# Patient Record
Sex: Male | Born: 1935 | Race: White | Hispanic: No | Marital: Married | Smoking: Former smoker
Health system: Southern US, Community
[De-identification: ages and names within clinical notes are randomized; demographics above are authoritative.]

## PROBLEM LIST (undated history)

## (undated) DIAGNOSIS — N200 Calculus of kidney: Secondary | ICD-10-CM

## (undated) DIAGNOSIS — I442 Atrioventricular block, complete: Secondary | ICD-10-CM

## (undated) DIAGNOSIS — Z87442 Personal history of urinary calculi: Secondary | ICD-10-CM

## (undated) DIAGNOSIS — I10 Essential (primary) hypertension: Secondary | ICD-10-CM

## (undated) HISTORY — PX: PROSTATE BIOPSY: SHX241

## (undated) HISTORY — PX: CORONARY ARTERY BYPASS GRAFT: SHX141

## (undated) HISTORY — PX: KIDNEY STONE SURGERY: SHX686

## (undated) HISTORY — PX: EYE SURGERY: SHX253

---
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
## 1993-12-02 HISTORY — PX: CORONARY ARTERY BYPASS GRAFT: SHX141

## 1994-09-01 DIAGNOSIS — I442 Atrioventricular block, complete: Secondary | ICD-10-CM

## 1994-09-01 HISTORY — DX: Atrioventricular block, complete: I44.2

## 2011-07-05 ENCOUNTER — Telehealth (INDEPENDENT_AMBULATORY_CARE_PROVIDER_SITE_OTHER): Payer: Self-pay | Admitting: *Deleted

## 2011-07-05 DIAGNOSIS — Z1211 Encounter for screening for malignant neoplasm of colon: Secondary | ICD-10-CM

## 2011-07-05 NOTE — Telephone Encounter (Signed)
TCS sch'd 10/02/11 @ 10:30 (9:30), asa 2 days, movi prep inst given

## 2011-07-15 MED ORDER — PEG-KCL-NACL-NASULF-NA ASC-C 100 G PO SOLR: 1.0000 | Freq: Once | ORAL | Status: DC

## 2011-09-19 ENCOUNTER — Other Ambulatory Visit (INDEPENDENT_AMBULATORY_CARE_PROVIDER_SITE_OTHER): Payer: Self-pay | Admitting: *Deleted

## 2011-09-19 DIAGNOSIS — Z1211 Encounter for screening for malignant neoplasm of colon: Secondary | ICD-10-CM

## 2011-10-01 MED ORDER — SODIUM CHLORIDE 0.45 % IV SOLN
Freq: Once | INTRAVENOUS | Status: AC
  Administered 2011-10-02: 10:00:00 via INTRAVENOUS

## 2011-10-02 ENCOUNTER — Other Ambulatory Visit (INDEPENDENT_AMBULATORY_CARE_PROVIDER_SITE_OTHER): Payer: Self-pay | Admitting: Internal Medicine

## 2011-10-02 ENCOUNTER — Ambulatory Visit (HOSPITAL_COMMUNITY)
Admission: RE | Admit: 2011-10-02 | Discharge: 2011-10-02 | Disposition: A | Discharge status: Home / Self Care | Payer: Medicare Other | Source: Ambulatory Visit | Attending: Internal Medicine | Admitting: Internal Medicine

## 2011-10-02 ENCOUNTER — Encounter (HOSPITAL_COMMUNITY)
Admission: RE | Disposition: A | Discharge status: Home / Self Care | Payer: Self-pay | Source: Ambulatory Visit | Attending: Internal Medicine

## 2011-10-02 ENCOUNTER — Encounter (HOSPITAL_COMMUNITY): Payer: Self-pay

## 2011-10-02 DIAGNOSIS — Z1211 Encounter for screening for malignant neoplasm of colon: Secondary | ICD-10-CM | POA: Insufficient documentation

## 2011-10-02 DIAGNOSIS — K5730 Diverticulosis of large intestine without perforation or abscess without bleeding: Secondary | ICD-10-CM

## 2011-10-02 DIAGNOSIS — D126 Benign neoplasm of colon, unspecified: Secondary | ICD-10-CM | POA: Insufficient documentation

## 2011-10-02 DIAGNOSIS — K648 Other hemorrhoids: Secondary | ICD-10-CM | POA: Insufficient documentation

## 2011-10-02 DIAGNOSIS — K644 Residual hemorrhoidal skin tags: Secondary | ICD-10-CM

## 2011-10-02 HISTORY — PX: COLONOSCOPY: SHX5424

## 2011-10-02 HISTORY — DX: Essential (primary) hypertension: I10

## 2011-10-02 HISTORY — DX: Atrioventricular block, complete: I44.2

## 2011-10-02 SURGERY — COLONOSCOPY
Anesthesia: Moderate Sedation

## 2011-10-02 MED ORDER — MEPERIDINE HCL 50 MG/ML IJ SOLN
INTRAMUSCULAR | Status: DC | PRN
  Administered 2011-10-02 (×2): 25 mg via INTRAVENOUS

## 2011-10-02 MED ORDER — STERILE WATER FOR IRRIGATION IR SOLN
Status: DC | PRN
  Administered 2011-10-02: 11:00:00

## 2011-10-02 MED ORDER — MIDAZOLAM HCL 5 MG/5ML IJ SOLN
INTRAMUSCULAR | Status: DC | PRN
  Administered 2011-10-02: 2 mg via INTRAVENOUS
  Administered 2011-10-02: 1 mg via INTRAVENOUS
  Administered 2011-10-02: 2 mg via INTRAVENOUS

## 2011-10-02 MED ORDER — MIDAZOLAM HCL 5 MG/5ML IJ SOLN
INTRAMUSCULAR | Status: AC
  Filled 2011-10-02: qty 10

## 2011-10-02 MED ORDER — MEPERIDINE HCL 50 MG/ML IJ SOLN
INTRAMUSCULAR | Status: AC
  Filled 2011-10-02: qty 1

## 2011-10-02 NOTE — H&P (Signed)
Mitchell Chandler is an 77 y.o. male.   Chief Complaint:   Patient is here for colonoscopy. HPI:  Patient is 77 year old Caucasian male who is here is first average risk screening colonoscopy. He denies abdominal pain change in his bowel habits or rectal bleeding..  Family history is negative for colorectal carcinoma.  Past Medical History  Diagnosis Date  . Hypertension   . 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 And[facility]    Past Surgical History  Procedure Date  . Eye surgery     feb 7 feb 28 [facility]  . Kidney stone surgery   . Coronary artery bypass graft     No family history on file. Social History:  reports that he has never smoked. He does not have any smokeless tobacco history on file. He reports that he does not drink alcohol or use illicit drugs.  Allergies: No Known Allergies  Medications Prior to Admission  Medication Dose Route Frequency Provider Last Rate Last Dose  . 0.45 % sodium chloride infusion   Intravenous Once Malissa Hippo, MD 20 mL/hr at 10/02/11 0953    . meperidine (DEMEROL) 50 MG/ML injection           . midazolam (VERSED) 5 MG/5ML injection            Medications Prior to Admission  Medication Sig Dispense Refill  . amLODipine-benazepril (LOTREL) 10-20 MG per capsule Take 1 capsule by mouth daily.        Marland Kitchen aspirin EC 81 MG tablet Take 81 mg by mouth daily.        . Cholecalciferol (VITAMIN D) 2000 UNITS CAPS Take 1 capsule by mouth daily.        Marland Kitchen doxazosin (CARDURA) 8 MG tablet Take 8 mg by mouth at bedtime.        . Folic Acid-Vit B6-Vit B12 (FOLBEE) 2.5-25-1 MG TABS Take 1 tablet by mouth daily.        . furosemide (LASIX) 20 MG tablet Take 20 mg by mouth daily.        . metoprolol (LOPRESSOR) 100 MG tablet Take 100 mg by mouth daily.        . peg 3350 powder (MOVIPREP) 100 G SOLR Take 1 kit (100 g total) by mouth once.  1 kit  0  . simvastatin (ZOCOR) 20 MG tablet Take 20 mg by mouth at bedtime.          No results found for  this or any previous visit (from the past 48 hour(s)). No results found.  Review of Systems  Constitutional: Negative for weight loss.  Gastrointestinal: Negative for abdominal pain, diarrhea, constipation, blood in stool and melena.    Blood pressure 140/67, pulse 90, temperature 98.7 F (37.1 C), temperature source Oral, resp. rate 33, height 5\' 9"  (1.753 m), weight 207 lb (93.895 kg), SpO2 97.00%. Physical Exam  Constitutional: He appears well-developed and well-nourished.  HENT:  Mouth/Throat: Oropharynx is clear and moist.  Eyes: Conjunctivae are normal. No scleral icterus.  Neck: No thyromegaly present.  Cardiovascular: Normal rate, regular rhythm and normal heart sounds.   No murmur heard. Respiratory: Effort normal and breath sounds normal.  GI: Soft. He exhibits no mass. There is no tenderness.  Musculoskeletal: He exhibits no edema.  Lymphadenopathy:    He has no cervical adenopathy.  Neurological: He is alert.  Skin: Skin is warm and dry.     Assessment/Plan  average risk screening colonoscopy.Marland Kitchen  REHMAN,NAJEEB U 10/02/2011, 10:41 AM

## 2011-10-02 NOTE — Op Note (Signed)
COLONOSCOPY PROCEDURE REPORT  PATIENT:  Mitchell Chandler  MR#:  [id number] Birthdate:  02/23/1932, 77 y.o., male Endoscopist:  Dr. Malissa Hippo, MD Referred By:  Dr.  Colon Branch,  M.D. Procedure Date: 10/02/2011  Procedure:   Colonoscopy with polypectomy  Indications:  Patient is 77 year old Caucasian male who is here for first average risk screening colonoscopy.  Informed Consent:  Procedure and risks were reviewed with the patient and informed consent was obtained.  Medications:  Demerol  50 mg IV Versed  5 mg IV  Description of procedure:  After a digital rectal exam was performed, that colonoscope was advanced from the anus through the rectum and colon to the area of the cecum, ileocecal valve and appendiceal orifice. The cecum was deeply intubated. These structures were well-seen and photographed for the record. From the level of the cecum and ileocecal valve, the scope was slowly and cautiously withdrawn. The mucosal surfaces were carefully surveyed utilizing scope tip to flexion to facilitate fold flattening as needed. The scope was pulled down into the rectum where a thorough exam including retroflexion was performed.  Findings:    Preparation was satisfactory.   2 small cecal polyps ablated via cold biopsy and submitted in one container.  15 mm sessile polyp at ascending colon with small finger like extension. This polyp was elevated with saline injection and polypectomy performed.  This polyp was removed in 2 pieces.  3 hemoclips applied at polypectomy site to reduce risk of bleeding.  4 mm polyp at proximal sigmoid colon was coagulated using snare tip.  8 mm polyp snared from distal sigmoid colon.   Scattered diverticula at sigmoid colon.  Internal/external hemorrhoids.  Therapeutic/Diagnostic Maneuvers Performed:   See above  Complications:   none  Cecal Withdrawal Time:   33 minutes  Impression:   Examination performed to cecum.  2 small polyps ablated via cold  biopsy from cecum  and submitted in one container.  15 mm sessile polyp snared from ascending colon following saline injection.  Three  hemoclips applied to polypectomy site. Polypectomy complete.  Small polyp at sigmoid colon was coagulated.  8 mm polyp snared from distal sigmoid colon.  Mild sigmoid diverticulosis.  Internal/external hemorrhoids. Recommendations:   No aspirin one week.  I will be contacting patient with results of biopsy and further recommendations.  Tiari Andringa U  10/02/2011 11:27 AM  CC: Dr. Colon Branch, MD & Dr. Bonnetta Barry ref. provider found

## 2011-10-08 ENCOUNTER — Encounter (HOSPITAL_COMMUNITY): Payer: Self-pay | Admitting: Internal Medicine

## 2011-10-11 ENCOUNTER — Encounter (INDEPENDENT_AMBULATORY_CARE_PROVIDER_SITE_OTHER): Payer: Self-pay | Admitting: *Deleted

## 2014-10-13 ENCOUNTER — Encounter (INDEPENDENT_AMBULATORY_CARE_PROVIDER_SITE_OTHER): Payer: Self-pay | Admitting: *Deleted

## 2014-10-19 ENCOUNTER — Telehealth (INDEPENDENT_AMBULATORY_CARE_PROVIDER_SITE_OTHER): Payer: Self-pay | Admitting: *Deleted

## 2014-10-19 ENCOUNTER — Other Ambulatory Visit (INDEPENDENT_AMBULATORY_CARE_PROVIDER_SITE_OTHER): Payer: Self-pay | Admitting: *Deleted

## 2014-10-19 DIAGNOSIS — Z86010 Personal history of colonic polyps: Secondary | ICD-10-CM

## 2014-10-19 DIAGNOSIS — Z1211 Encounter for screening for malignant neoplasm of colon: Secondary | ICD-10-CM

## 2014-10-19 NOTE — Telephone Encounter (Signed)
Patient needs movi prep 

## 2014-10-20 MED ORDER — PEG-KCL-NACL-NASULF-NA ASC-C 100 G PO SOLR: 1.0000 | Freq: Once | ORAL | Status: DC

## 2014-11-21 ENCOUNTER — Telehealth (INDEPENDENT_AMBULATORY_CARE_PROVIDER_SITE_OTHER): Payer: Self-pay | Admitting: *Deleted

## 2014-11-21 NOTE — Telephone Encounter (Signed)
Referring MD/PCP: Eula Fried   Procedure: tcs  Reason/Indication:  Hx polyps  Has patient had this procedure before?  Yes, 2012 -- epic  If so, when, by whom and where?    Is there a family history of colon cancer?  no  Who?  What age when diagnosed?    Is patient diabetic?   no      Does patient have prosthetic heart valve?  no  Do you have a pacemaker?  no  Has patient ever had endocarditis? no  Has patient had joint replacement within last 12 months?  no  Does patient tend to be constipated or take laxatives? no  Is patient on Coumadin, Plavix and/or Aspirin? yes  Medications: asa 81 mg daily, metoprolol 50 mg daily, furosemide 20 mg daily, amlodipine 10/20 mg 1 tab every other day, doxazosin 4 mg daily, simvastatin 20 mg daily  Allergies: nkda  Medication Adjustment: asa 2 days  Procedure date & time: 12/21/14 at 1030

## 2014-11-28 NOTE — Telephone Encounter (Signed)
agree

## 2014-12-21 ENCOUNTER — Encounter (HOSPITAL_COMMUNITY)
Admission: RE | Disposition: A | Discharge status: Home / Self Care | Payer: Self-pay | Source: Ambulatory Visit | Attending: Internal Medicine

## 2014-12-21 ENCOUNTER — Encounter (HOSPITAL_COMMUNITY): Payer: Self-pay

## 2014-12-21 ENCOUNTER — Ambulatory Visit (HOSPITAL_COMMUNITY)
Admission: RE | Admit: 2014-12-21 | Discharge: 2014-12-21 | Disposition: A | Discharge status: Home / Self Care | Payer: Medicare Other | Source: Ambulatory Visit | Attending: Internal Medicine | Admitting: Internal Medicine

## 2014-12-21 DIAGNOSIS — Z1211 Encounter for screening for malignant neoplasm of colon: Secondary | ICD-10-CM | POA: Insufficient documentation

## 2014-12-21 DIAGNOSIS — I10 Essential (primary) hypertension: Secondary | ICD-10-CM | POA: Insufficient documentation

## 2014-12-21 DIAGNOSIS — K644 Residual hemorrhoidal skin tags: Secondary | ICD-10-CM | POA: Diagnosis not present

## 2014-12-21 DIAGNOSIS — D124 Benign neoplasm of descending colon: Secondary | ICD-10-CM

## 2014-12-21 DIAGNOSIS — K635 Polyp of colon: Secondary | ICD-10-CM | POA: Insufficient documentation

## 2014-12-21 DIAGNOSIS — Z951 Presence of aortocoronary bypass graft: Secondary | ICD-10-CM | POA: Diagnosis not present

## 2014-12-21 DIAGNOSIS — Z7982 Long term (current) use of aspirin: Secondary | ICD-10-CM | POA: Diagnosis not present

## 2014-12-21 DIAGNOSIS — K5730 Diverticulosis of large intestine without perforation or abscess without bleeding: Secondary | ICD-10-CM

## 2014-12-21 DIAGNOSIS — Z86010 Personal history of colonic polyps: Secondary | ICD-10-CM

## 2014-12-21 DIAGNOSIS — D122 Benign neoplasm of ascending colon: Secondary | ICD-10-CM

## 2014-12-21 DIAGNOSIS — Z87891 Personal history of nicotine dependence: Secondary | ICD-10-CM | POA: Diagnosis not present

## 2014-12-21 DIAGNOSIS — K648 Other hemorrhoids: Secondary | ICD-10-CM | POA: Diagnosis not present

## 2014-12-21 DIAGNOSIS — K649 Unspecified hemorrhoids: Secondary | ICD-10-CM

## 2014-12-21 HISTORY — PX: COLONOSCOPY: SHX5424

## 2014-12-21 SURGERY — COLONOSCOPY
Anesthesia: Moderate Sedation

## 2014-12-21 MED ORDER — MIDAZOLAM HCL 5 MG/5ML IJ SOLN
INTRAMUSCULAR | Status: AC
  Filled 2014-12-21: qty 10

## 2014-12-21 MED ORDER — MEPERIDINE HCL 50 MG/ML IJ SOLN
INTRAMUSCULAR | Status: AC
  Filled 2014-12-21: qty 1

## 2014-12-21 MED ORDER — STERILE WATER FOR IRRIGATION IR SOLN
Status: DC | PRN
  Administered 2014-12-21: 10:00:00

## 2014-12-21 MED ORDER — MIDAZOLAM HCL 5 MG/5ML IJ SOLN
INTRAMUSCULAR | Status: DC | PRN
  Administered 2014-12-21: 1 mg via INTRAVENOUS
  Administered 2014-12-21 (×2): 2 mg via INTRAVENOUS

## 2014-12-21 MED ORDER — MEPERIDINE HCL 50 MG/ML IJ SOLN
INTRAMUSCULAR | Status: DC | PRN
  Administered 2014-12-21: 25 mg

## 2014-12-21 MED ORDER — SODIUM CHLORIDE 0.9 % IV SOLN
INTRAVENOUS | Status: DC
  Administered 2014-12-21: 10:00:00 via INTRAVENOUS

## 2014-12-21 NOTE — Discharge Instructions (Signed)
Resume usual medications and high fiber diet. No driving for 24 hours. Physician will call with biopsy results.  Colonoscopy, Care After Refer to this sheet in the next few weeks. These instructions provide you with information on caring for yourself after your procedure. Your health care provider may also give you more specific instructions. Your treatment has been planned according to current medical practices, but problems sometimes occur. Call your health care provider if you have any problems or questions after your procedure. WHAT TO EXPECT AFTER THE PROCEDURE  After your procedure, it is typical to have the following:  A small amount of blood in your stool.  Moderate amounts of gas and mild abdominal cramping or bloating. HOME CARE INSTRUCTIONS  Do not drive, operate machinery, or sign important documents for 24 hours.  You may shower and resume your regular physical activities, but move at a slower pace for the first 24 hours.  Take frequent rest periods for the first 24 hours.  Walk around or put a warm pack on your abdomen to help reduce abdominal cramping and bloating.  Drink enough fluids to keep your urine clear or pale yellow.  You may resume your normal diet as instructed by your health care provider. Avoid heavy or fried foods that are hard to digest.  Avoid drinking alcohol for 24 hours or as instructed by your health care provider.  Only take over-the-counter or prescription medicines as directed by your health care provider.  If a tissue sample (biopsy) was taken during your procedure:  Do not take aspirin or blood thinners for 7 days, or as instructed by your health care provider.  Do not drink alcohol for 7 days, or as instructed by your health care provider.  Eat soft foods for the first 24 hours. SEEK MEDICAL CARE IF: You have persistent spotting of blood in your stool 2-3 days after the procedure. SEEK IMMEDIATE MEDICAL CARE IF:  You have more than a  small spotting of blood in your stool.  You pass large blood clots in your stool.  Your abdomen is swollen (distended).  You have nausea or vomiting.  You have a fever.  You have increasing abdominal pain that is not relieved with medicine. Document Released: 07/02/2004 Document Revised: 09/08/2013 Document Reviewed: 07/26/2013 [facility] Patient Information 2015 [place], [place]. This information is not intended to replace advice given to you by your health care provider. Make sure you discuss any questions you have with your health care provider.   High-Fiber Diet Fiber is found in fruits, vegetables, and grains. A high-fiber diet encourages the addition of more whole grains, legumes, fruits, and vegetables in your diet. The recommended amount of fiber for adult males is 38 g per day. For adult females, it is 25 g per day. Pregnant and lactating women should get 28 g of fiber per day. If you have a digestive or bowel problem, ask your caregiver for advice before adding high-fiber foods to your diet. Eat a variety of high-fiber foods instead of only a select few type of foods.  PURPOSE  To increase stool bulk.  To make bowel movements more regular to prevent constipation.  To lower cholesterol.  To prevent overeating. WHEN IS THIS DIET USED?  It may be used if you have constipation and hemorrhoids.  It may be used if you have uncomplicated diverticulosis (intestine condition) and irritable bowel syndrome.  It may be used if you need help with weight management.  It may be used if you want  to add it to your diet as a protective measure against atherosclerosis, diabetes, and cancer. SOURCES OF FIBER  Whole-grain breads and cereals.  Fruits, such as apples, oranges, bananas, berries, prunes, and pears.  Vegetables, such as green peas, carrots, sweet potatoes, beets, broccoli, cabbage, spinach, and artichokes.  Legumes, such split peas, soy, lentils.  Almonds. FIBER CONTENT IN  FOODS Starches and Grains / Dietary Fiber (g)  Cheerios, 1 cup / 3 g  Corn Flakes cereal, 1 cup / 0.7 g  Rice crispy treat cereal, 1 cup / 0.3 g  Instant oatmeal (cooked),  cup / 2 g  Frosted wheat cereal, 1 cup / 5.1 g  Brown, long-grain rice (cooked), 1 cup / 3.5 g  White, long-grain rice (cooked), 1 cup / 0.6 g  Enriched macaroni (cooked), 1 cup / 2.5 g Legumes / Dietary Fiber (g)  Baked beans (canned, plain, or vegetarian),  cup / 5.2 g  Kidney beans (canned),  cup / 6.8 g  Pinto beans (cooked),  cup / 5.5 g Breads and Crackers / Dietary Fiber (g)  Plain or honey graham crackers, 2 squares / 0.7 g  Saltine crackers, 3 squares / 0.3 g  Plain, salted pretzels, 10 pieces / 1.8 g  Whole-wheat bread, 1 slice / 1.9 g  White bread, 1 slice / 0.7 g  Raisin bread, 1 slice / 1.2 g  Plain bagel, 3 oz / 2 g  Flour tortilla, 1 oz / 0.9 g  Corn tortilla, 1 small / 1.5 g  Hamburger or hotdog bun, 1 small / 0.9 g Fruits / Dietary Fiber (g)  Apple with skin, 1 medium / 4.4 g  Sweetened applesauce,  cup / 1.5 g  Banana,  medium / 1.5 g  Grapes, 10 grapes / 0.4 g  Orange, 1 small / 2.3 g  Raisin, 1.5 oz / 1.6 g  Melon, 1 cup / 1.4 g Vegetables / Dietary Fiber (g)  Green beans (canned),  cup / 1.3 g  Carrots (cooked),  cup / 2.3 g  Broccoli (cooked),  cup / 2.8 g  Peas (cooked),  cup / 4.4 g  Mashed potatoes,  cup / 1.6 g  Lettuce, 1 cup / 0.5 g  Corn (canned),  cup / 1.6 g  Tomato,  cup / 1.1 g Document Released: 11/18/2005 Document Revised: 05/19/2012 Document Reviewed: 02/20/2012 [employer] Patient Information 2015 [place], [place]. This information is not intended to replace advice given to you by your health care provider. Make sure you discuss any questions you have with your health care provider.   Colon Polyps Polyps are lumps of extra tissue growing inside the body. Polyps can grow in the large intestine (colon). Most colon polyps  are noncancerous (benign). However, some colon polyps can become cancerous over time. Polyps that are larger than a pea may be harmful. To be safe, caregivers remove and test all polyps. CAUSES  Polyps form when mutations in the genes cause your cells to grow and divide even though no more tissue is needed. RISK FACTORS There are a number of risk factors that can increase your chances of getting colon polyps. They include:  Being older than 50 years.  Family history of colon polyps or colon cancer.  Long-term colon diseases, such as colitis or Crohn disease.  Being overweight.  Smoking.  Being inactive.  Drinking too much alcohol. SYMPTOMS  Most small polyps do not cause symptoms. If symptoms are present, they may include:  Blood in the stool. The  stool may look dark red or black.  Constipation or diarrhea that lasts longer than 1 week. DIAGNOSIS People often do not know they have polyps until their caregiver finds them during a regular checkup. Your caregiver can use 4 tests to check for polyps:  Digital rectal exam. The caregiver wears gloves and feels inside the rectum. This test would find polyps only in the rectum.  Barium enema. The caregiver puts a liquid called barium into your rectum before taking X-rays of your colon. Barium makes your colon look white. Polyps are dark, so they are easy to see in the X-Beni pictures.  Sigmoidoscopy. A thin, flexible tube (sigmoidoscope) is placed into your rectum. The sigmoidoscope has a light and tiny camera in it. The caregiver uses the sigmoidoscope to look at the last third of your colon.  Colonoscopy. This test is like sigmoidoscopy, but the caregiver looks at the entire colon. This is the most common method for finding and removing polyps. TREATMENT  Any polyps will be removed during a sigmoidoscopy or colonoscopy. The polyps are then tested for cancer. PREVENTION  To help lower your risk of getting more colon polyps:  Eat  plenty of fruits and vegetables. Avoid eating fatty foods.  Do not smoke.  Avoid drinking alcohol.  Exercise every day.  Lose weight if recommended by your caregiver.  Eat plenty of calcium and folate. Foods that are rich in calcium include milk, cheese, and broccoli. Foods that are rich in folate include chickpeas, kidney beans, and spinach. HOME CARE INSTRUCTIONS Keep all follow-up appointments as directed by your caregiver. You may need periodic exams to check for polyps. SEEK MEDICAL CARE IF: You notice bleeding during a bowel movement. Document Released: 08/14/2004 Document Revised: 02/10/2012 Document Reviewed: 01/28/2012 [facility] Patient Information 2015 [place], [place]. This information is not intended to replace advice given to you by your health care provider. Make sure you discuss any questions you have with your health care provider.

## 2014-12-21 NOTE — H&P (Signed)
Mitchell Chandler is an 81 y.o. male.   Chief Complaint: Patient is here for colonoscopy. HPI: Patient is 81 year old Caucasian male who was history of colonic adenomas and is here for surveillance colonoscopy. His last colonoscopy was in October 2012 with removal of 3 polyps including 15 mm flat polyp from ascending colon. He denies abdominal pain change in bowel habits or rectal bleeding. Family history is negative for CRC.  Past Medical History  Diagnosis Date  . Hypertension   . 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 Redmond [facility]    Past Surgical History  Procedure Laterality Date  . Eye surgery      feb 7 feb 28 [place]  . Kidney stone surgery    . Coronary artery bypass graft    . Colonoscopy  10/02/2011    Procedure: COLONOSCOPY;  Surgeon: Rogene Houston, MD;  Location: [facility];  Service: Endoscopy;  Laterality: N/A;  10:30 am    History reviewed. No pertinent family history. Social History:  reports that he quit smoking about 52 years ago. He does not have any smokeless tobacco history on file. He reports that he does not drink alcohol or use illicit drugs.  Allergies: No Known Allergies  Medications Prior to Admission  Medication Sig Dispense Refill  . amLODipine-benazepril (LOTREL) 10-20 MG per capsule Take 1 capsule by mouth daily.      Marland Kitchen aspirin 81 MG chewable tablet Chew 81 mg by mouth daily.    Marland Kitchen doxazosin (CARDURA) 8 MG tablet Take 8 mg by mouth at bedtime.      . Folic Acid-Vit M3-OBO F96 (FOLBEE) 2.5-25-1 MG TABS Take 1 tablet by mouth daily.      . furosemide (LASIX) 20 MG tablet Take 20 mg by mouth daily.      . metoprolol (LOPRESSOR) 50 MG tablet Take 50 mg by mouth daily.    . peg 3350 powder (MOVIPREP) 100 G SOLR Take 1 kit (200 g total) by mouth once. 1 kit 0  . simvastatin (ZOCOR) 20 MG tablet Take 20 mg by mouth at bedtime.      . peg 3350 powder (MOVIPREP) 100 G SOLR Take 1 kit (100 g total) by mouth once. (Patient not taking: Reported  on 12/15/2014) 1 kit 0    No results found for this or any previous visit (from the past 48 hour(s)). No results found.  ROS  Blood pressure 155/71, pulse 66, temperature 98.1 F (36.7 C), temperature source Oral, resp. rate 18, height '5\' 8"'  (1.727 m), weight 205 lb (92.987 kg), SpO2 94 %. Physical Exam  Constitutional: He appears well-developed and well-nourished.  HENT:  Mouth/Throat: Oropharynx is clear and moist.  Eyes: Conjunctivae are normal. No scleral icterus.  Neck: No thyromegaly present.  Cardiovascular: Normal rate, regular rhythm and normal heart sounds.   No murmur heard. Respiratory: Effort normal and breath sounds normal.  GI: Soft. He exhibits no distension and no mass. There is no tenderness.  Musculoskeletal: He exhibits no edema.  Lymphadenopathy:    He has no cervical adenopathy.  Neurological: He is alert.  Skin: Skin is warm and dry.     Assessment/Plan History of colonic adenomas. Surveillance colonoscopy.  Mitchell,NAJEEB Chandler 12/21/2014, 10:21 AM

## 2014-12-21 NOTE — Op Note (Signed)
COLONOSCOPY PROCEDURE REPORT  PATIENT:  Mitchell Chandler  MR#:  [id number] Birthdate:  1932-02-08, 81 y.o., male Endoscopist:  Dr. Rogene Houston, MD Referred By:  Dr. Cleda Mccreedy, MD Procedure Date: 12/21/2014  Procedure:   Colonoscopy  Indications:  Patient is 81 year old Caucasian male with history of colonic polyps who is here for surveillance colonoscopy. His last exam was in October 2012 with removal of 3 adenomas including large adenoma from ascending colon.  Informed Consent:  The procedure and risks were reviewed with the patient and informed consent was obtained.  Medications:  Demerol 25 mg IV Versed 5 mg IV  Description of procedure:  After a digital rectal exam was performed, that colonoscope was advanced from the anus through the rectum and colon to the area of the cecum, ileocecal valve and appendiceal orifice. The cecum was deeply intubated. These structures were well-seen and photographed for the record. From the level of the cecum and ileocecal valve, the scope was slowly and cautiously withdrawn. The mucosal surfaces were carefully surveyed utilizing scope tip to flexion to facilitate fold flattening as needed. The scope was pulled down into the rectum where a thorough exam including retroflexion was performed.  Findings:   Prep satisfactory. Scar noted at ascending colon just above and across ileocecal valve site of previous polypectomy. No residual polyp identified. Small polyp ablated cold biopsy from ascending colon. Two small polyps cold snared from ascending colon and submitted along with polyp from ascending colon. Few scattered diverticula at descending with moderate number of diverticula at sigmoid colon. Normal rectal mucosa. Small hemorrhoids above and below the dentate line.   Therapeutic/Diagnostic Maneuvers Performed:  See above  Complications:  None  Cecal Withdrawal Time:  19 minutes  Impression:  Examination performed to cecum. Scar noted at  ascending colon, site of previous polypectomy and no residual polyp identified. Three small polyps removed as above. One was lost and the other two were submitted together(one at ascending colon and two at descending colon). Left-sided diverticulosis. Small internal and external hemorrhoids.  Recommendations:  Standard instructions given. High fiber diet. I will contact patient with biopsy results and further recommendations.  Cristela Stalder U  12/21/2014 11:00 AM  CC: Dr. Cleda Mccreedy, MD & Dr. Rayne Du ref. provider found

## 2014-12-22 ENCOUNTER — Encounter (HOSPITAL_COMMUNITY): Payer: Self-pay | Admitting: Internal Medicine

## 2014-12-27 ENCOUNTER — Encounter (INDEPENDENT_AMBULATORY_CARE_PROVIDER_SITE_OTHER): Payer: Self-pay | Admitting: *Deleted

## 2016-12-25 DIAGNOSIS — I10 Essential (primary) hypertension: Secondary | ICD-10-CM | POA: Insufficient documentation

## 2016-12-25 DIAGNOSIS — N400 Enlarged prostate without lower urinary tract symptoms: Secondary | ICD-10-CM

## 2016-12-25 DIAGNOSIS — E785 Hyperlipidemia, unspecified: Secondary | ICD-10-CM | POA: Insufficient documentation

## 2016-12-25 HISTORY — DX: Benign prostatic hyperplasia without lower urinary tract symptoms: N40.0

## 2019-05-17 ENCOUNTER — Other Ambulatory Visit: Payer: Self-pay | Admitting: Physician Assistant

## 2019-05-17 ENCOUNTER — Other Ambulatory Visit (HOSPITAL_COMMUNITY): Payer: Self-pay | Admitting: Physician Assistant

## 2019-05-17 DIAGNOSIS — N200 Calculus of kidney: Secondary | ICD-10-CM

## 2019-05-20 ENCOUNTER — Ambulatory Visit (HOSPITAL_COMMUNITY)
Admission: RE | Admit: 2019-05-20 | Discharge: 2019-05-20 | Disposition: A | Discharge status: Home / Self Care | Payer: Medicare Other | Source: Ambulatory Visit | Attending: Physician Assistant | Admitting: Physician Assistant

## 2019-05-20 ENCOUNTER — Other Ambulatory Visit: Payer: Self-pay

## 2019-05-20 DIAGNOSIS — N200 Calculus of kidney: Secondary | ICD-10-CM | POA: Diagnosis not present

## 2019-05-20 IMAGING — CT CT ABDOMEN AND PELVIS WITHOUT CONTRAST
2 of 4 series · 16 of 46 positions shown, 18 images · non-contrast
Comparison: None.

CLINICAL DATA: Left flank pain for 3 weeks. Nephrolithiasis.

EXAM:
CT ABDOMEN AND PELVIS WITHOUT CONTRAST
TECHNIQUE: Multidetector CT imaging of the abdomen and pelvis was performed
following the standard protocol without IV contrast.

[Series 2: axial st · axial · 0.82mm/px · z∈[+1059,+1464]mm · 13 of 91 slices shown, 15 images]
[im 5/91  soft-tissue]
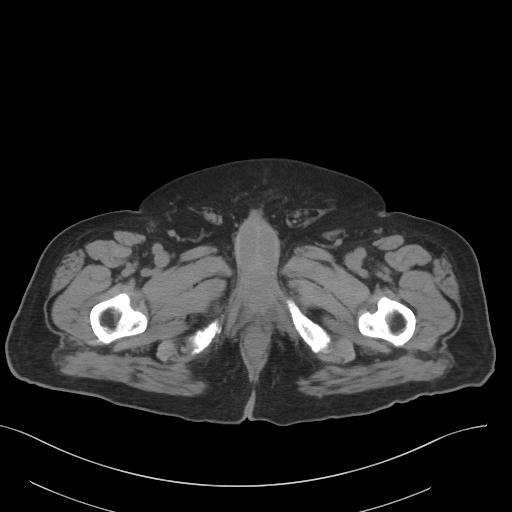
[im 5/91  bone]
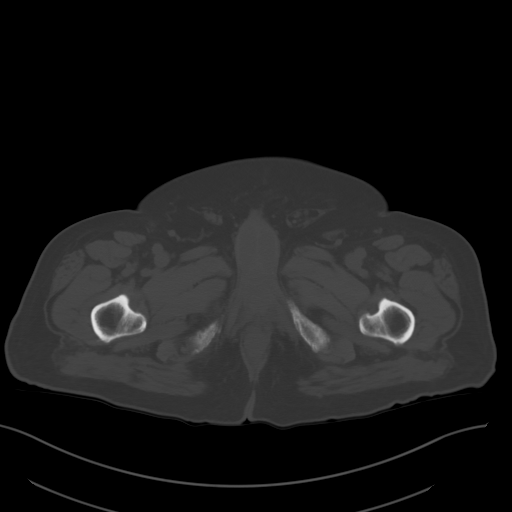
[im 15/91  soft-tissue]
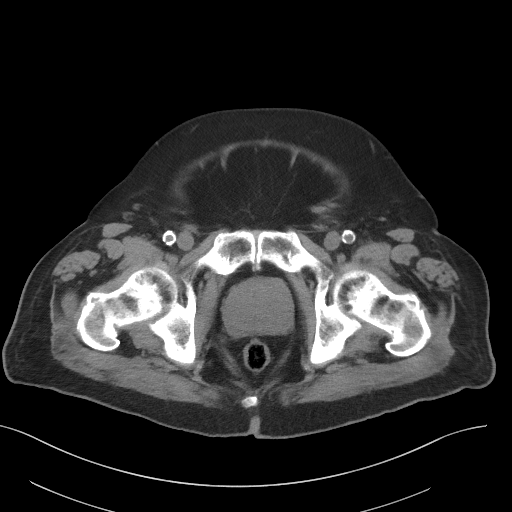
[im 19/91  soft-tissue]
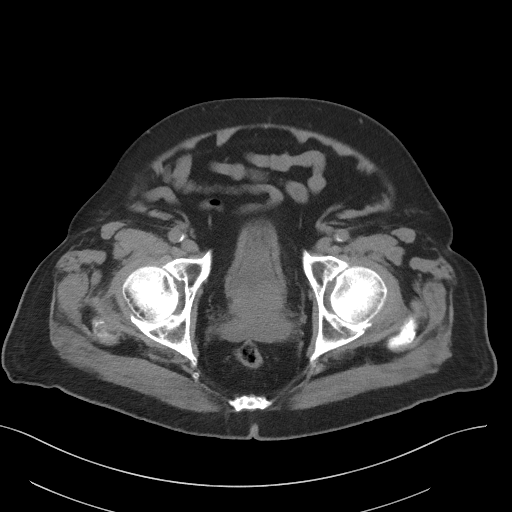
[im 24/91  soft-tissue]
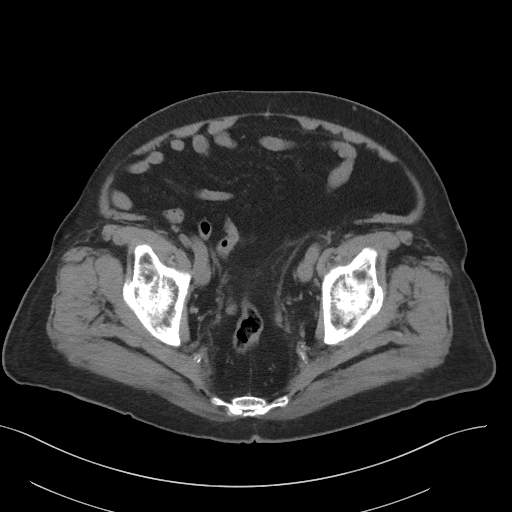
[im 34/91  soft-tissue]
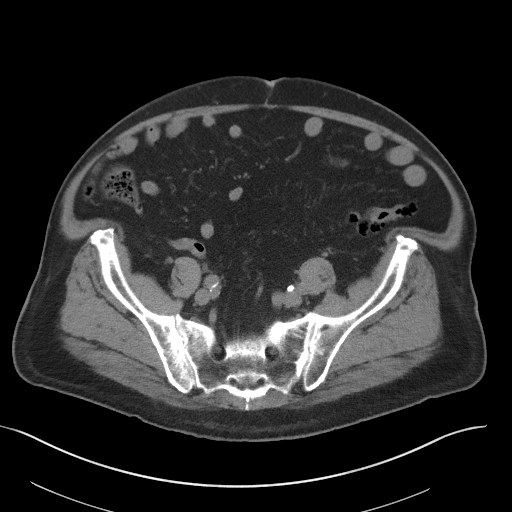
[im 38/91  soft-tissue]
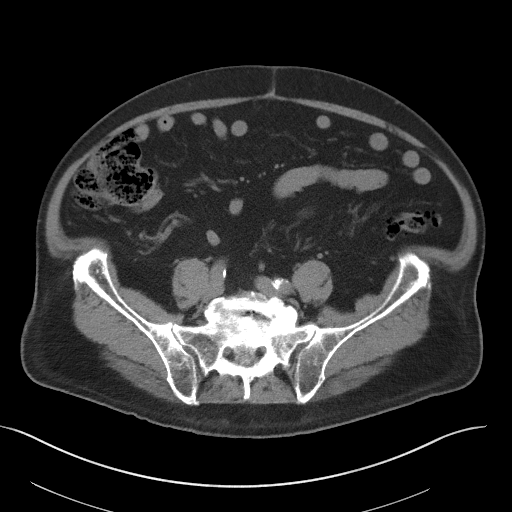
[im 48/91  soft-tissue]
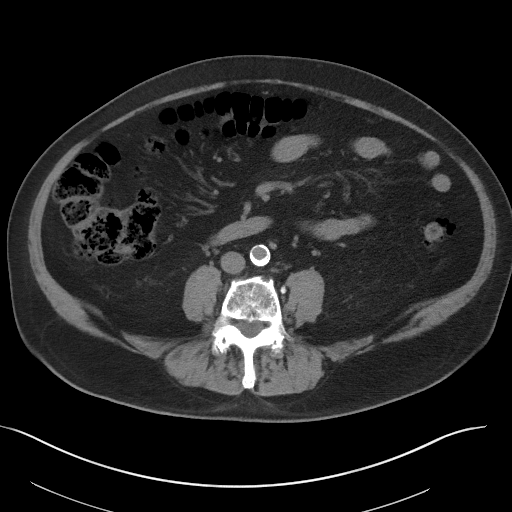
[im 53/91  soft-tissue]
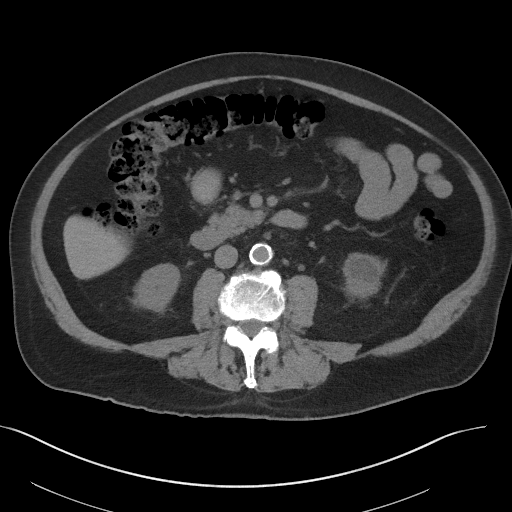
[im 57/91  soft-tissue]
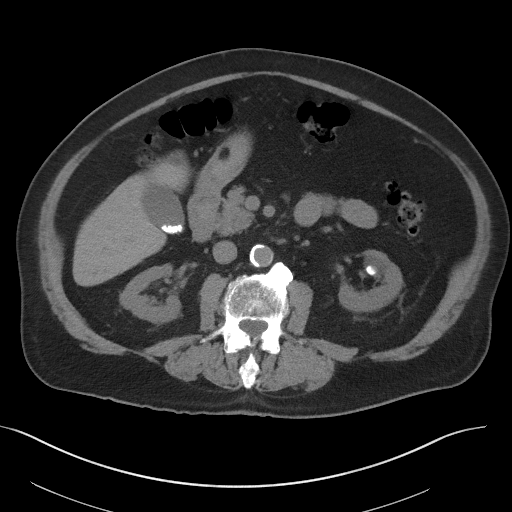
[im 57/91  bone]
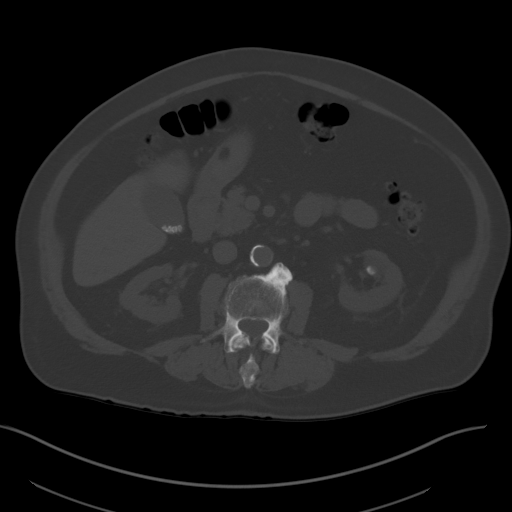
[im 67/91  soft-tissue]
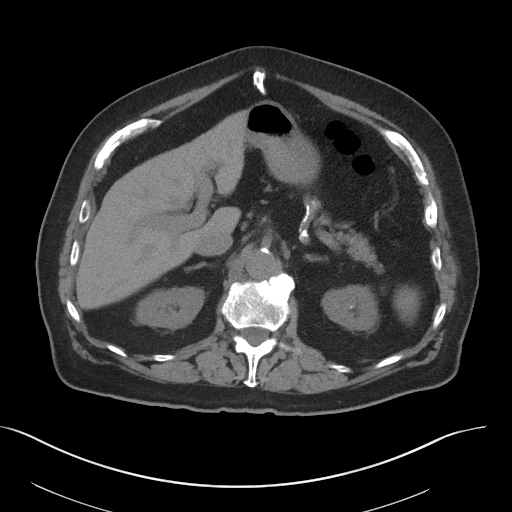
[im 72/91  soft-tissue]
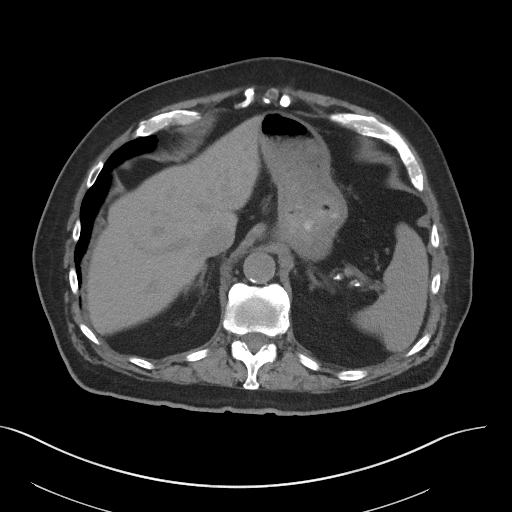
[im 76/91  soft-tissue]
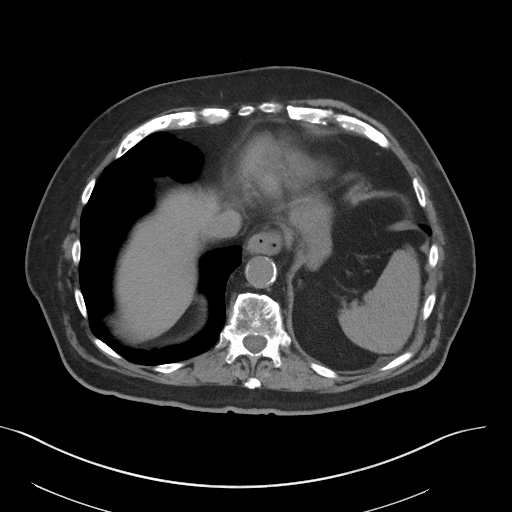
[im 86/91  soft-tissue]
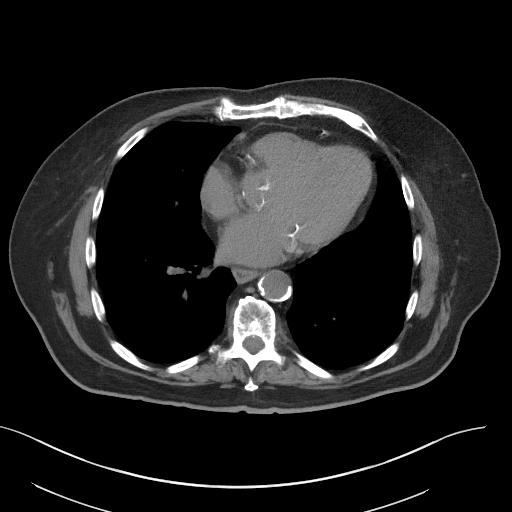

[Series 5: coronal st · coronal · 0.88mm/px · 3 of 108 slices shown]
[im 36/108  soft-tissue]
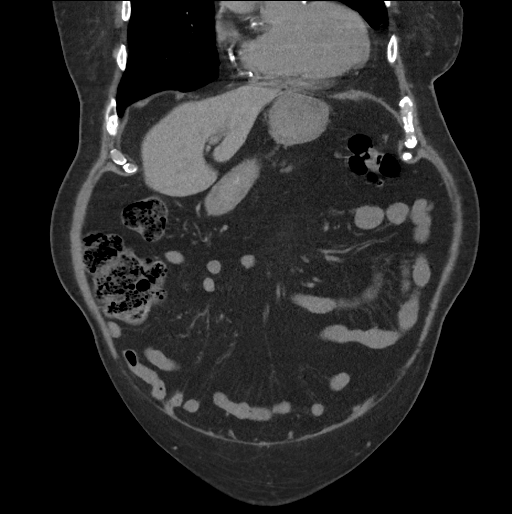
[im 48/108  soft-tissue]
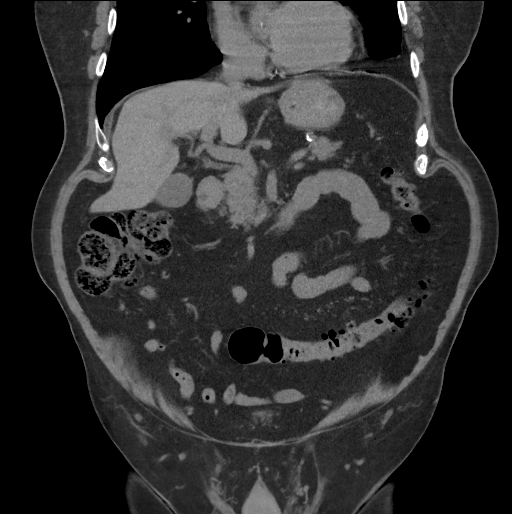
[im 60/108  soft-tissue]
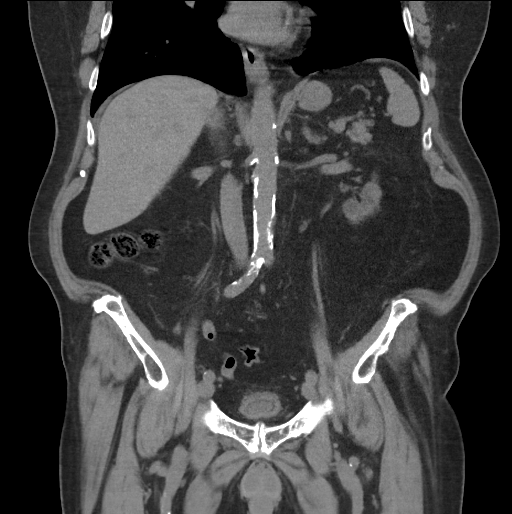

[16 of 46 positions shown; findings below may reference images not displayed]

FINDINGS: Lower chest: No acute findings.

Hepatobiliary: No mass visualized on this unenhanced exam. Multiple
calcified gallstones are seen, however there is no evidence of
cholecystitis or biliary ductal dilatation.

Pancreas: No mass or inflammatory process visualized on this
unenhanced exam.

Spleen:  Within normal limits in size.

Adrenals/Urinary tract: Several left renal calculi are seen, largest
in lower pole measuring 1.4 cm. This largest calculus results in
calyceal dilatation in the lower pole, however there is no evidence
of ureteral calculi or generalized hydronephrosis. A 1.8 cm
subcapsular lesion is seen in the upper pole of the right kidney
which has higher than fluid attenuation. This cannot be
characterized on this unenhanced exam, and could represent a complex
cyst or solid renal mass.

Stomach/Bowel: No evidence of obstruction, inflammatory process, or
abnormal fluid collections. Normal appendix visualized.
Diverticulosis is seen mainly involving the descending and sigmoid
colon, however there is no evidence of diverticulitis.

Vascular/Lymphatic: No pathologically enlarged lymph nodes
identified. No evidence of abdominal aortic aneurysm. Aortic
atherosclerosis.

Reproductive:  Moderately enlarged prostate.

Other:  None.

Musculoskeletal:  No suspicious bone lesions identified.
IMPRESSION: 1. Several left renal calculi, largest in lower pole measuring
cm causing focal calyceal dilatation.
2. No evidence of ureteral calculi or hydronephrosis.
3. Colonic diverticulosis. No radiographic evidence of
diverticulitis.
4. Cholelithiasis. No radiographic evidence of cholecystitis.
5. Moderately enlarged prostate.
6. 1.8 cm subcapsular lesion in upper pole of right kidney cannot be
characterized on this unenhanced exam, and could represent a complex
cyst or solid renal mass. Abdomen MRI without and with contrast is
recommended for further characterization.

Aortic Atherosclerosis ([3J]-[3J]).

## 2019-06-24 NOTE — Progress Notes (Signed)
New Patient Office Visit  Assessment & Plan:  1. Renal calculus, left - Patient is currently taking Cardura for BPH which should also help with passing stone. Advised him to stop taking NSAIDs and take Tylenol 1,000 mg TID PRN. Patient to drink more water and less Dr. Malachi Bonds. Education provided on kidney stones.  - MR Abdomen W Wo Contrast; Future  2. Lesion of right native kidney - CT recommended MRI to further work-up the lesion found on the right kidney.  - MR Abdomen W Wo Contrast; Future  3. Benign prostatic hyperplasia with urinary frequency - Controlled with Cardura. Managed by Dr. Redmond Pulling (urologist).  - CMP14+EGFR  4. Dyslipidemia - Well controlled on current regimen.  - simvastatin (ZOCOR) 20 MG tablet; Take 1 tablet (20 mg total) by mouth at bedtime.  Dispense: 90 tablet; Refill: 1 - CMP14+EGFR  5. Essential (primary) hypertension - Well controlled on current regimen.  - amLODipine-benazepril (LOTREL) 10-20 MG capsule; Take 1 capsule by mouth daily.  Dispense: 90 capsule; Refill: 1 - furosemide (LASIX) 20 MG tablet; Take 1 tablet (20 mg total) by mouth daily.  Dispense: 90 tablet; Refill: 1 - metoprolol tartrate (LOPRESSOR) 50 MG tablet; Take 1 tablet (50 mg total) by mouth daily. (Patient taking differently: Take 100 mg by mouth daily. )  Dispense: 90 tablet; Refill: 1 - CMP14+EGFR  6. Encounter to establish care   Follow-up: Return in about 6 months (around 12/26/2019) for follow-up of chronic medication conditions or sooner if needed for kidney stones.   [name], MSN, APRN, FNP-C [facility]  Subjective:  Patient ID: [name], male    DOB: 14-Jun-1932  Age: 85 y.o. MRN: [id number]  Patient Care Team: Loman Brooklyn, FNP as PCP - General (Family Medicine) Leroy Sea, MD as Referring Physician (Urology)  CC:  Chief Complaint  Patient presents with  . Establish Care    HPI Mitchell Chandler presents to establish care. He  is transferring care from Dr. Murrell Redden office as he has retired and the office has closed. He is accompanied today by his son-in-law, who he is okay with being present.   Patient reports he has been dealing with kidney stones for 2 months now and that he has lost 16 lbs since this started. He is not experiencing swelling in his left lower extremity as well as feeling numbness in the right great toe. He had an abdominal/pelvis CT scan completed on 05/20/2019 which revealed the following impression:  1. Several left renal calculi, largest in lower pole measuring 1.4 cm causing focal calyceal dilatation. 2. No evidence of ureteral calculi or hydronephrosis. 3. Colonic diverticulosis. No radiographic evidence of diverticulitis. 4. Cholelithiasis. No radiographic evidence of cholecystitis. 5. Moderately enlarged prostate. 6. 1.8 cm subcapsular lesion in upper pole of right kidney cannot be characterized on this unenhanced exam, and could represent a complex cyst or solid renal mass. Abdomen MRI without and with contrast is recommended for further characterization.  He is taking Advil for pain.   Patient goes to Dr. Redmond Pulling (urologist) for BPH. He reports he had an appointment but cancelled due to everything that is going on and his previous PCP office closing.    Review of Systems  Constitutional: Negative for chills, fever, malaise/fatigue and weight loss.  HENT: Negative for congestion, ear discharge, ear pain, nosebleeds, sinus pain, sore throat and tinnitus.   Eyes: Negative for blurred vision, double vision, pain, discharge and redness.  Respiratory: Negative for cough,  shortness of breath and wheezing.   Cardiovascular: Positive for leg swelling. Negative for chest pain and palpitations.  Gastrointestinal: Negative for abdominal pain, constipation, diarrhea, heartburn, nausea and vomiting.  Genitourinary: Positive for flank pain and urgency. Negative for dysuria and frequency.       Denies  trouble initiating a urine stream and dribbling.  Does have weak stream and split stream at times.   Musculoskeletal: Positive for joint pain and neck pain. Negative for myalgias.  Skin: Negative for rash.  Neurological: Positive for weakness. Negative for dizziness, seizures and headaches.  Psychiatric/Behavioral: Negative for depression, substance abuse and suicidal ideas. The patient is not nervous/anxious.     Current Outpatient Medications:  .  amLODipine-benazepril (LOTREL) 10-20 MG capsule, Take 1 capsule by mouth daily., Disp: 90 capsule, Rfl: 1 .  aspirin 81 MG chewable tablet, Chew 81 mg by mouth daily., Disp: , Rfl:  .  doxazosin (CARDURA) 8 MG tablet, Take 8 mg by mouth at bedtime.  , Disp: , Rfl:  .  Folic Acid-Vit A7-GOT L57 (FOLBEE) 2.5-25-1 MG TABS tablet, Take 1 tablet by mouth daily., Disp: 90 tablet, Rfl: 1 .  furosemide (LASIX) 20 MG tablet, Take 1 tablet (20 mg total) by mouth daily., Disp: 90 tablet, Rfl: 1 .  metoprolol tartrate (LOPRESSOR) 50 MG tablet, Take 1 tablet (50 mg total) by mouth daily. (Patient taking differently: Take 100 mg by mouth daily. ), Disp: 90 tablet, Rfl: 1 .  simvastatin (ZOCOR) 20 MG tablet, Take 1 tablet (20 mg total) by mouth at bedtime., Disp: 90 tablet, Rfl: 1  No Known Allergies  Past Medical History:  Diagnosis Date  . 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 Redmond Pulling  . Hypertension   . Kidney stones     Past Surgical History:  Procedure Laterality Date  . COLONOSCOPY  10/02/2011   Procedure: COLONOSCOPY;  Surgeon: Rogene Houston, MD;  Location: [facility];  Service: Endoscopy;  Laterality: N/A;  10:30 am  . COLONOSCOPY N/A 12/21/2014   Procedure: COLONOSCOPY;  Surgeon: Rogene Houston, MD;  Location: [facility];  Service: Endoscopy;  Laterality: N/A;  1030  . CORONARY ARTERY BYPASS GRAFT  1995  . EYE SURGERY     feb 7 feb 28 [place]  . KIDNEY STONE SURGERY    . PROSTATE BIOPSY      Family  History  Problem Relation Age of Onset  . Heart disease Mother   . Hypertension Father   . Hypertension Brother   . Hypertension Brother   . Hypertension Brother   . Hypertension Brother     Social History   Socioeconomic History  . Marital status: Married    Spouse name: Not on file  . Number of children: Not on file  . Years of education: Not on file  . Highest education level: Not on file  Occupational History  . Not on file  Social Needs  . Financial resource strain: Not on file  . Food insecurity    Worry: Not on file    Inability: Not on file  . Transportation needs    Medical: Not on file    Non-medical: Not on file  Tobacco Use  . Smoking status: Former Smoker    Quit date: 12/21/1962    Years since quitting: 56.5  . Smokeless tobacco: Never Used  Substance and Sexual Activity  . Alcohol use: No  . Drug use: No  . Sexual activity:  Not on file  Lifestyle  . Physical activity    Days per week: Not on file    Minutes per session: Not on file  . Stress: Not on file  Relationships  . Social Herbalist on phone: Not on file    Gets together: Not on file    Attends religious service: Not on file    Active member of club or organization: Not on file    Attends meetings of clubs or organizations: Not on file    Relationship status: Not on file  . Intimate partner violence    Fear of current or ex partner: Not on file    Emotionally abused: Not on file    Physically abused: Not on file    Forced sexual activity: Not on file  Other Topics Concern  . Not on file  Social History Narrative  . Not on file    Objective:   Today's Vitals: BP (!) 101/57   Pulse (!) 55   Temp (!) 96.6 F (35.9 C) (Oral)   Ht '5\' 8"'  (1.727 m)   Wt 185 lb (83.9 kg)   BMI 28.13 kg/m   Physical Exam Vitals signs reviewed.  Constitutional:      General: He is not in acute distress.    Appearance: Normal appearance. He is overweight. He is not ill-appearing,  toxic-appearing or diaphoretic.  HENT:     Head: Normocephalic and atraumatic.     Right Ear: Tympanic membrane, ear canal and external ear normal. There is no impacted cerumen.     Left Ear: Tympanic membrane, ear canal and external ear normal. There is no impacted cerumen.     Nose: Nose normal. No congestion or rhinorrhea.     Mouth/Throat:     Mouth: Mucous membranes are moist.     Pharynx: Oropharynx is clear. No oropharyngeal exudate or posterior oropharyngeal erythema.  Eyes:     General: No scleral icterus.       Right eye: No discharge.        Left eye: No discharge.     Conjunctiva/sclera: Conjunctivae normal.     Pupils: Pupils are equal, round, and reactive to light.  Neck:     Musculoskeletal: Normal range of motion and neck supple. No neck rigidity or muscular tenderness.  Cardiovascular:     Rate and Rhythm: Normal rate and regular rhythm.     Heart sounds: Normal heart sounds. No murmur. No friction rub. No gallop.   Pulmonary:     Effort: Pulmonary effort is normal. No respiratory distress.     Breath sounds: Normal breath sounds. No stridor. No wheezing, rhonchi or rales.  Abdominal:     General: Abdomen is flat. Bowel sounds are normal. There is no distension.     Palpations: Abdomen is soft. There is no mass.     Tenderness: There is abdominal tenderness in the left lower quadrant. There is left CVA tenderness. There is no guarding or rebound.     Hernia: No hernia is present.  Musculoskeletal: Normal range of motion.     Left lower leg: Edema present.  Lymphadenopathy:     Cervical: No cervical adenopathy.  Skin:    General: Skin is warm and dry.     Capillary Refill: Capillary refill takes less than 2 seconds.  Neurological:     General: No focal deficit present.     Mental Status: He is alert and oriented to person, place, and time. Mental  status is at baseline.  Psychiatric:        Mood and Affect: Mood normal.        Behavior: Behavior normal.         Thought Content: Thought content normal.        Judgment: Judgment normal.

## 2019-06-25 ENCOUNTER — Ambulatory Visit (INDEPENDENT_AMBULATORY_CARE_PROVIDER_SITE_OTHER): Payer: Medicare Other | Admitting: Family Medicine

## 2019-06-25 ENCOUNTER — Other Ambulatory Visit: Payer: Self-pay

## 2019-06-25 ENCOUNTER — Encounter: Payer: Self-pay | Admitting: Family Medicine

## 2019-06-25 ENCOUNTER — Telehealth: Payer: Self-pay | Admitting: Family Medicine

## 2019-06-25 ENCOUNTER — Encounter (INDEPENDENT_AMBULATORY_CARE_PROVIDER_SITE_OTHER): Payer: Self-pay

## 2019-06-25 VITALS — BP 101/57 | HR 55 | Temp 96.6°F | Ht 68.0 in | Wt 185.0 lb

## 2019-06-25 DIAGNOSIS — I10 Essential (primary) hypertension: Secondary | ICD-10-CM

## 2019-06-25 DIAGNOSIS — N289 Disorder of kidney and ureter, unspecified: Secondary | ICD-10-CM | POA: Diagnosis not present

## 2019-06-25 DIAGNOSIS — R350 Frequency of micturition: Secondary | ICD-10-CM

## 2019-06-25 DIAGNOSIS — E785 Hyperlipidemia, unspecified: Secondary | ICD-10-CM

## 2019-06-25 DIAGNOSIS — N200 Calculus of kidney: Secondary | ICD-10-CM | POA: Diagnosis not present

## 2019-06-25 DIAGNOSIS — N401 Enlarged prostate with lower urinary tract symptoms: Secondary | ICD-10-CM

## 2019-06-25 DIAGNOSIS — Z7689 Persons encountering health services in other specified circumstances: Secondary | ICD-10-CM

## 2019-06-25 MED ORDER — AMLODIPINE BESY-BENAZEPRIL HCL 10-20 MG PO CAPS: 1.0000 | ORAL_CAPSULE | Freq: Every day | ORAL | 1 refills | Status: DC

## 2019-06-25 MED ORDER — FUROSEMIDE 20 MG PO TABS: 20.0000 mg | ORAL_TABLET | Freq: Every day | ORAL | 1 refills | Status: DC

## 2019-06-25 MED ORDER — METOPROLOL TARTRATE 50 MG PO TABS: 50.0000 mg | ORAL_TABLET | Freq: Every day | ORAL | 1 refills | Status: DC

## 2019-06-25 MED ORDER — SIMVASTATIN 20 MG PO TABS: 20.0000 mg | ORAL_TABLET | Freq: Every day | ORAL | 1 refills | Status: DC

## 2019-06-25 MED ORDER — FOLIC ACID-VIT B6-VIT B12 2.5-25-1 MG PO TABS: 1.0000 | ORAL_TABLET | Freq: Every day | ORAL | 1 refills | Status: DC

## 2019-06-25 NOTE — Patient Instructions (Signed)
Tylenol 500 mg tablets - take 2 tablets by mouth three times a day as needed.    Kidney Stones  Kidney stones (urolithiasis) are rock-like masses that form inside of the kidneys. Kidneys are organs that make pee (urine). A kidney stone can cause very bad pain and can block the flow of pee. The stone usually leaves your body (passes) through your pee. You may need to have a doctor take out the stone. Follow these instructions at home: Eating and drinking  Drink enough fluid to keep your pee clear or pale yellow. This will help you pass the stone.  If told by your doctor, change the foods you eat (your diet). This may include: ? Limiting how much salt (sodium) you eat. ? Eating more fruits and vegetables. ? Limiting how much meat, poultry, fish, and eggs you eat.  Follow instructions from your doctor about eating or drinking restrictions. General instructions  Collect pee samples as told by your doctor. You may need to collect a pee sample: ? 24 hours after a stone comes out. ? 8-12 weeks after a stone comes out, and every 6-12 months after that.  Strain your pee every time you pee (urinate), for as long as told. Use the strainer that your doctor recommends.  Do not throw out the stone. Keep it so that it can be tested by your doctor.  Take over-the-counter and prescription medicines only as told by your doctor.  Keep all follow-up visits as told by your doctor. This is important. You may need follow-up tests. Preventing kidney stones To prevent another kidney stone:  Drink enough fluid to keep your pee clear or pale yellow. This is the best way to prevent kidney stones.  Eat healthy foods.  Avoid certain foods as told by your doctor. You may be told to eat less protein.  Stay at a healthy weight. Contact a doctor if:  You have pain that gets worse or does not get better with medicine. Get help right away if:  You have a fever or chills.  You get very bad pain.  You get  new pain in your belly (abdomen).  You pass out (faint).  You cannot pee. This information is not intended to replace advice given to you by your health care provider. Make sure you discuss any questions you have with your health care provider. Document Released: 05/06/2008 Document Revised: 06/30/2018 Document Reviewed: 08/06/2016 Elsevier Patient Education  [employer].   Arthritis Arthritis means joint pain. It can also mean joint disease. A joint is a place where bones come together. There are more than 100 types of arthritis. What are the causes? This condition may be caused by:  Wear and tear of a joint. This is the most common cause.  A lot of acid in the blood, which leads to pain in the joint (gout).  Pain and swelling (inflammation) in a joint.  Infection of a joint.  Injuries in the joint.  A reaction to medicines (allergy). In some cases, the cause may not be known. What are the signs or symptoms? Symptoms of this condition include:  Redness at a joint.  Swelling at a joint.  Stiffness at a joint.  Warmth coming from the joint.  A fever.  A feeling of being sick. How is this treated? This condition may be treated with:  Treating the cause, if it is known.  Rest.  Raising (elevating) the joint.  Putting cold or hot packs on the joint.  Medicines to treat symptoms and reduce pain and swelling.  Shots of medicines (cortisone) into the joint. You may also be told to make changes in your life, such as doing exercises and losing weight. Follow these instructions at home: Medicines  Take over-the-counter and prescription medicines only as told by your doctor.  Do not take aspirin for pain if your doctor says that you may have gout. Activity  Rest your joint if your doctor tells you to.  Avoid activities that make the pain worse.  Exercise your joint regularly as told by your doctor. Try doing exercises like: ? Swimming. ? Water  aerobics. ? Biking. ? Walking. Managing pain, stiffness, and swelling      If told, put ice on the affected area. ? Put ice in a plastic bag. ? Place a towel between your skin and the bag. ? Leave the ice on for 20 minutes, 2-3 times per day.  If your joint is swollen, raise (elevate) it above the level of your heart if told by your doctor.  If your joint feels stiff in the morning, try taking a warm shower.  If told, put heat on the affected area. Do this as often as told by your doctor. Use the heat source that your doctor recommends, such as a moist heat pack or a heating pad. If you have diabetes, do not apply heat without asking your doctor. To apply heat: ? Place a towel between your skin and the heat source. ? Leave the heat on for 20-30 minutes. ? Remove the heat if your skin turns bright red. This is very important if you are unable to feel pain, heat, or cold. You may have a greater risk of getting burned. General instructions  Do not use any products that contain nicotine or tobacco, such as cigarettes, e-cigarettes, and chewing tobacco. If you need help quitting, ask your doctor.  Keep all follow-up visits as told by your doctor. This is important. Contact a doctor if:  The pain gets worse.  You have a fever. Get help right away if:  You have very bad pain in your joint.  You have swelling in your joint.  Your joint is red.  Many joints become painful and swollen.  You have very bad back pain.  Your leg is very weak.  You cannot control your pee (urine) or poop (stool). Summary  Arthritis means joint pain. It can also mean joint disease. A joint is a place where bones come together.  The most common cause of this condition is wear and tear of a joint.  Symptoms of this condition include redness, swelling, or stiffness of the joint.  This condition is treated with rest, raising the joint, medicines, and putting cold or hot packs on the joint.  Follow  your doctor's instructions about medicines, activity, exercises, and other home care treatments. This information is not intended to replace advice given to you by your health care provider. Make sure you discuss any questions you have with your health care provider. Document Released: 02/12/2010 Document Revised: 10/26/2018 Document Reviewed: 10/26/2018 Elsevier Patient Education  2020 [place] 65 Years and Older, Male Preventive care refers to lifestyle choices and visits with your health care provider that can promote health and wellness. This includes:  A yearly physical exam. This is also called an annual well check.  Regular dental and eye exams.  Immunizations.  Screening for certain conditions.  Healthy lifestyle choices, such as diet and  exercise. What can I expect for my preventive care visit? Physical exam Your health care provider will check:  Height and weight. These may be used to calculate body mass index (BMI), which is a measurement that tells if you are at a healthy weight.  Heart rate and blood pressure.  Your skin for abnormal spots. Counseling Your health care provider may ask you questions about:  Alcohol, tobacco, and drug use.  Emotional well-being.  Home and relationship well-being.  Sexual activity.  Eating habits.  History of falls.  Memory and ability to understand (cognition).  Work and work Statistician. What immunizations do I need?  Influenza (flu) vaccine  This is recommended every year. Tetanus, diphtheria, and pertussis (Tdap) vaccine  You may need a Td booster every 10 years. Varicella (chickenpox) vaccine  You may need this vaccine if you have not already been vaccinated. Zoster (shingles) vaccine  You may need this after age 7. Pneumococcal conjugate (PCV13) vaccine  One dose is recommended after age 12. Pneumococcal polysaccharide (PPSV23) vaccine  One dose is recommended after age 49.  Measles, mumps, and rubella (MMR) vaccine  You may need at least one dose of MMR if you were born in 1957 or later. You may also need a second dose. Meningococcal conjugate (MenACWY) vaccine  You may need this if you have certain conditions. Hepatitis A vaccine  You may need this if you have certain conditions or if you travel or work in places where you may be exposed to hepatitis A. Hepatitis B vaccine  You may need this if you have certain conditions or if you travel or work in places where you may be exposed to hepatitis B. Haemophilus influenzae type b (Hib) vaccine  You may need this if you have certain conditions. You may receive vaccines as individual doses or as more than one vaccine together in one shot (combination vaccines). Talk with your health care provider about the risks and benefits of combination vaccines. What tests do I need? Blood tests  Lipid and cholesterol levels. These may be checked every 5 years, or more frequently depending on your overall health.  Hepatitis C test.  Hepatitis B test. Screening  Lung cancer screening. You may have this screening every year starting at age 34 if you have a 30-pack-year history of smoking and currently smoke or have quit within the past 15 years.  Colorectal cancer screening. All adults should have this screening starting at age 6 and continuing until age 61. Your health care provider may recommend screening at age 93 if you are at increased risk. You will have tests every 1-10 years, depending on your results and the type of screening test.  Prostate cancer screening. Recommendations will vary depending on your family history and other risks.  Diabetes screening. This is done by checking your blood sugar (glucose) after you have not eaten for a while (fasting). You may have this done every 1-3 years.  Abdominal aortic aneurysm (AAA) screening. You may need this if you are a current or former smoker.  Sexually  transmitted disease (STD) testing. Follow these instructions at home: Eating and drinking  Eat a diet that includes fresh fruits and vegetables, whole grains, lean protein, and low-fat dairy products. Limit your intake of foods with high amounts of sugar, saturated fats, and salt.  Take vitamin and mineral supplements as recommended by your health care provider.  Do not drink alcohol if your health care provider tells you not to drink.  If you  drink alcohol: ? Limit how much you have to 0-2 drinks a day. ? Be aware of how much alcohol is in your drink. In the [place] one drink equals one 12 oz bottle of beer (355 mL), one 5 oz glass of wine (148 mL), or one 1 oz glass of hard liquor (44 mL). Lifestyle  Take daily care of your teeth and gums.  Stay active. Exercise for at least 30 minutes on 5 or more days each week.  Do not use any products that contain nicotine or tobacco, such as cigarettes, e-cigarettes, and chewing tobacco. If you need help quitting, ask your health care provider.  If you are sexually active, practice safe sex. Use a condom or other form of protection to prevent STIs (sexually transmitted infections).  Talk with your health care provider about taking a low-dose aspirin or statin. What's next?  Visit your health care provider once a year for a well check visit.  Ask your health care provider how often you should have your eyes and teeth checked.  Stay up to date on all vaccines. This information is not intended to replace advice given to you by your health care provider. Make sure you discuss any questions you have with your health care provider. Document Released: 12/15/2015 Document Revised: 11/12/2018 Document Reviewed: 11/12/2018 Elsevier Patient Education  2020 [employer].

## 2019-06-25 NOTE — Telephone Encounter (Signed)
Clarified does with pharmacist. Patient is to remain on 100mg 

## 2019-06-26 LAB — CMP14+EGFR
ALT: 17 IU/L (ref 0–44)
AST: 23 IU/L (ref 0–40)
Albumin/Globulin Ratio: 1.7 (ref 1.2–2.2)
Albumin: 4 g/dL (ref 3.6–4.6)
Alkaline Phosphatase: 69 IU/L (ref 39–117)
BUN/Creatinine Ratio: 15 (ref 10–24)
BUN: 16 mg/dL (ref 8–27)
Bilirubin Total: 0.6 mg/dL (ref 0.0–1.2)
CO2: 22 mmol/L (ref 20–29)
Calcium: 9.4 mg/dL (ref 8.6–10.2)
Chloride: 106 mmol/L (ref 96–106)
Creatinine, Ser: 1.07 mg/dL (ref 0.76–1.27)
GFR calc Af Amer: 73 mL/min/{1.73_m2} (ref >59)
GFR calc non Af Amer: 63 mL/min/{1.73_m2} (ref >59)
Globulin, Total: 2.3 g/dL (ref 1.5–4.5)
Glucose: 121 mg/dL — ABNORMAL HIGH (ref 65–99)
Potassium: 4.2 mmol/L (ref 3.5–5.2)
Sodium: 142 mmol/L (ref 134–144)
Total Protein: 6.3 g/dL (ref 6.0–8.5)

## 2019-06-29 ENCOUNTER — Encounter: Payer: Self-pay | Admitting: Family Medicine

## 2019-07-06 ENCOUNTER — Ambulatory Visit (HOSPITAL_COMMUNITY): Payer: PRIVATE HEALTH INSURANCE

## 2019-07-12 ENCOUNTER — Ambulatory Visit: Payer: Self-pay | Admitting: Family

## 2019-08-02 ENCOUNTER — Other Ambulatory Visit: Payer: Self-pay | Admitting: *Deleted

## 2019-08-02 ENCOUNTER — Telehealth: Payer: Self-pay | Admitting: Family Medicine

## 2019-08-02 DIAGNOSIS — N200 Calculus of kidney: Secondary | ICD-10-CM

## 2019-08-02 NOTE — Telephone Encounter (Signed)
Please reschedule his MRI since he did not get the message about the appointment on 07-06-19.

## 2019-08-02 NOTE — Telephone Encounter (Signed)
Pt has chronic kidney stones he has seen urologist in [place]. He would like second opinion and would like to go to [facility] the office in [place]. Please advise.

## 2019-08-02 NOTE — Telephone Encounter (Signed)
Referral has been started and patient aware. 

## 2019-09-22 ENCOUNTER — Telehealth: Payer: Self-pay | Admitting: Family Medicine

## 2019-09-22 DIAGNOSIS — N200 Calculus of kidney: Secondary | ICD-10-CM

## 2019-09-22 DIAGNOSIS — N2889 Other specified disorders of kidney and ureter: Secondary | ICD-10-CM

## 2019-09-22 DIAGNOSIS — N289 Disorder of kidney and ureter, unspecified: Secondary | ICD-10-CM

## 2019-09-22 NOTE — Telephone Encounter (Signed)
Patient had a MRI ordered on 06/25/2019 that I cannot tell has been completed. Can we please follow-up on this?

## 2019-09-24 NOTE — Telephone Encounter (Signed)
Patient was scheduled on 07/06/2019 at [facility] and was a no show.

## 2019-09-28 NOTE — Telephone Encounter (Signed)
Per Ref note patient was contacted on 06/28/2019 with no call back for 8/4 appointment - order for MRI has expired so you will need to place a new order so I can reschedule him - I spoke with [facility] stated his ref had been placed in proficient and was "in review" status but ref was placed 2 months ago - Patient is scheduled at their [facility] location on 10/25/2019 @ 8:15 AM. I will contact patient and let him know and Maudie Mercury from [facility] also stated they will send out a new patient packet with appointment information as well.

## 2019-09-28 NOTE — Telephone Encounter (Signed)
Patient was never notified of appointment for MRI per his report. He has also not heard anything from [facility] were he was referred on 08/02/2019. Can we please reschedule MRI and get him an appointment with [facility] ASAP?

## 2019-10-04 ENCOUNTER — Ambulatory Visit (HOSPITAL_COMMUNITY)
Admission: RE | Admit: 2019-10-04 | Discharge: 2019-10-04 | Disposition: A | Discharge status: Home / Self Care | Payer: Medicare Other | Source: Ambulatory Visit | Attending: Family Medicine | Admitting: Family Medicine

## 2019-10-04 ENCOUNTER — Other Ambulatory Visit: Payer: Self-pay

## 2019-10-04 DIAGNOSIS — N200 Calculus of kidney: Secondary | ICD-10-CM | POA: Insufficient documentation

## 2019-10-04 DIAGNOSIS — N289 Disorder of kidney and ureter, unspecified: Secondary | ICD-10-CM | POA: Insufficient documentation

## 2019-10-04 DIAGNOSIS — N2889 Other specified disorders of kidney and ureter: Secondary | ICD-10-CM | POA: Diagnosis present

## 2019-10-04 LAB — POCT I-STAT CREATININE: Creatinine, Ser: 1 mg/dL (ref 0.61–1.24)

## 2019-10-04 IMAGING — MR MR ABDOMEN WO/W CM
8 of 18 series · 16 of 48 positions shown · IV contrast ([ID])
Comparison: CT [DATE]

CLINICAL DATA: Renal mass.

EXAM:
MRI ABDOMEN WITHOUT AND WITH CONTRAST
TECHNIQUE: Multiplanar multisequence MR imaging of the abdomen was performed
both before and after the administration of intravenous contrast.
CONTRAST:  7mL GADAVIST GADOBUTROL 1 MMOL/ML IV SOLN

[Series 3: T2 · coronal · 4.0mm · 1.29mm/px · 1 of 48 slices shown (1 of 3)]
[im 1/48]
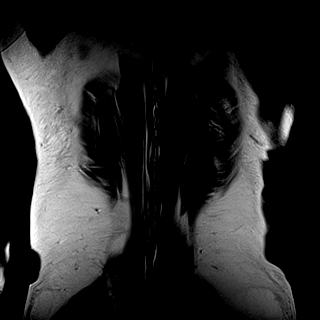

[Series 4: T2 · axial · 4.0mm · 0.52mm/px · 1 of 48 slices shown (2 of 3)]
[im 1/48]
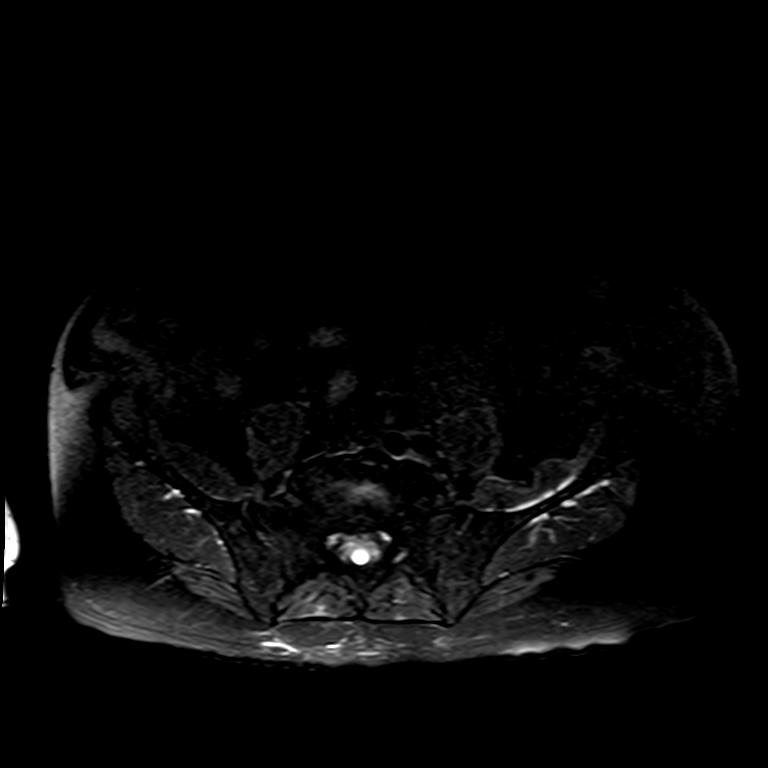

[Series 5: ax dual echo_in · axial · 4.0mm · 0.59mm/px · z∈[-180,+72]mm · 2 of 64 slices shown]
[im 1/64]
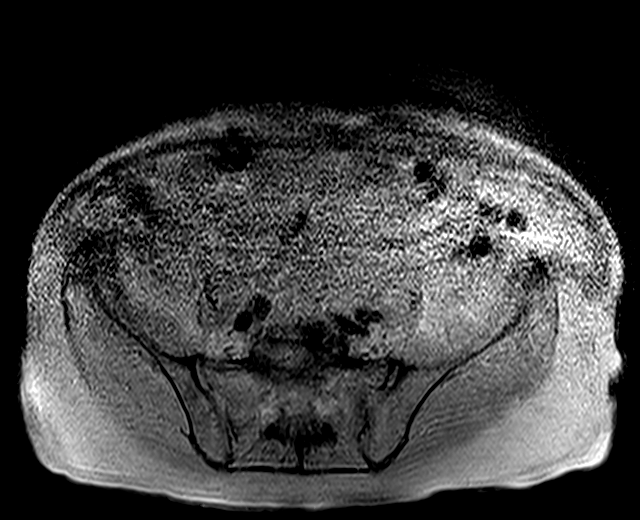
[im 64/64]
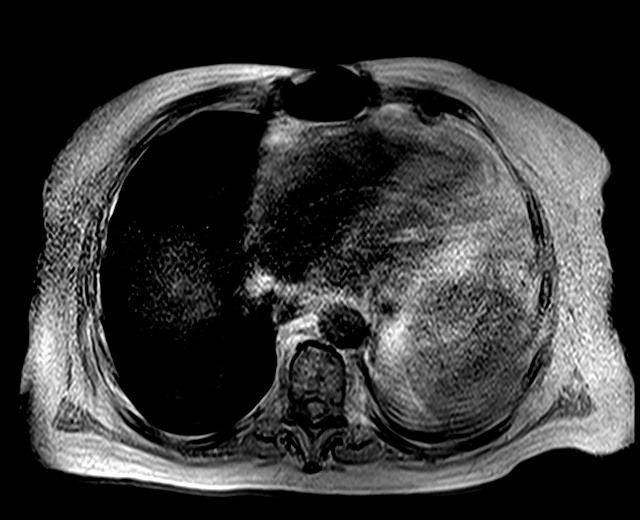

[Series 6: ax dual echo_opp · axial · 4.0mm · 0.59mm/px · z∈[-180,+72]mm · 3 of 64 slices shown]
[im 1/64]
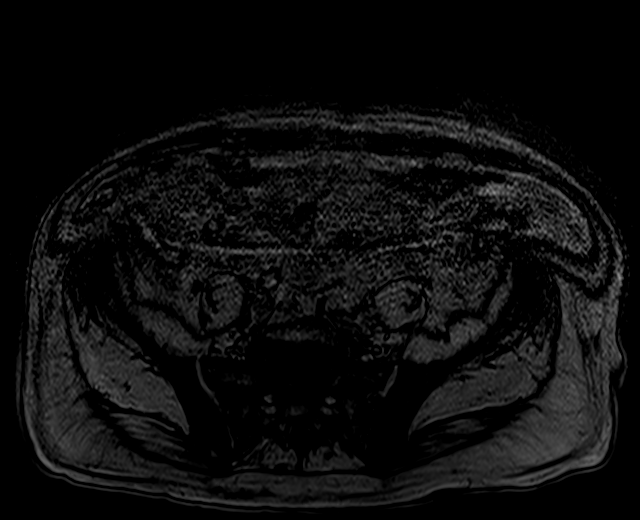
[im 32/64]
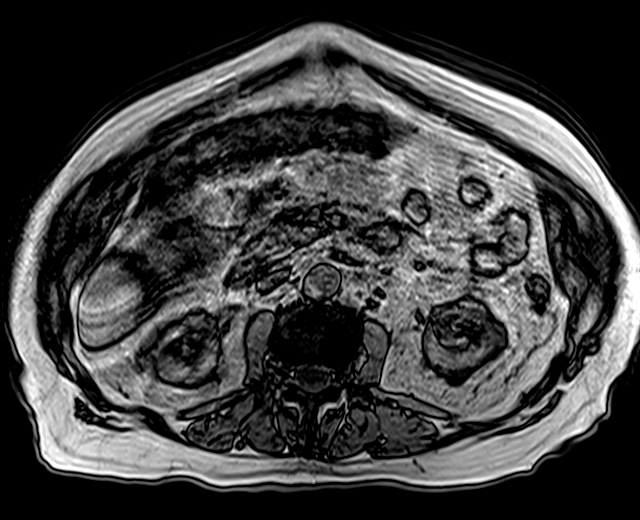
[im 64/64]
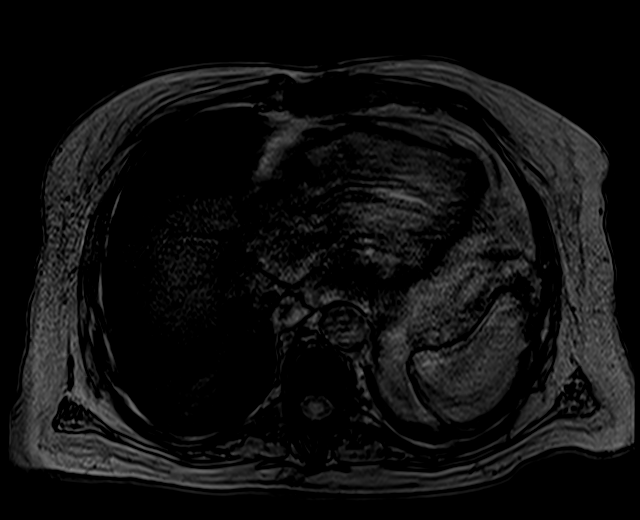

[Series 9: DWI · axial · 5.0mm · 1.04mm/px · z∈[-171,+63]mm · 3 of 80 slices shown]
[im 1/80]
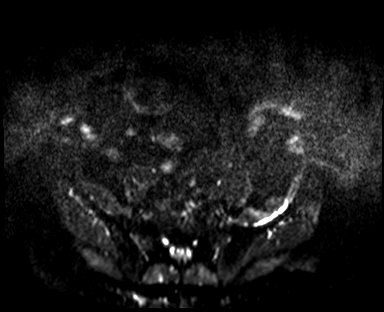
[im 40/80]
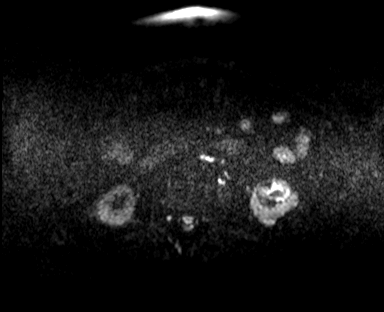
[im 80/80]
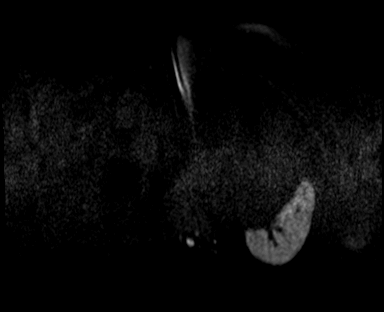

[Series 10: ax dwi_adc · axial · 5.0mm · 1.04mm/px · z∈[-171,+63]mm · 2 of 40 slices shown]
[im 1/40]
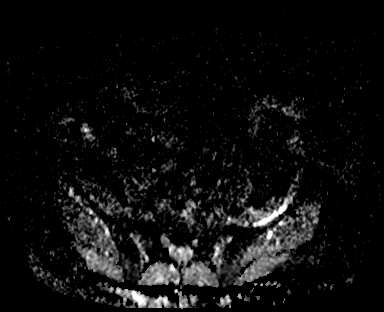
[im 40/40]
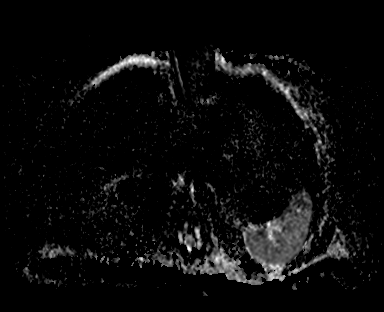

[Series 11: GRE · axial · 4.0mm · 0.78mm/px · z∈[-192,+84]mm · 3 of 70 slices shown]
[im 1/70]
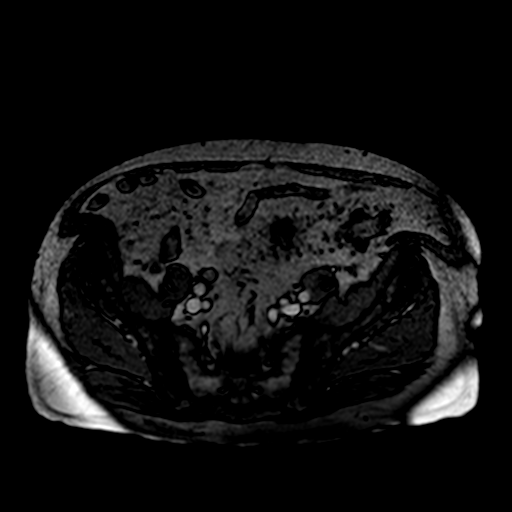
[im 35/70]
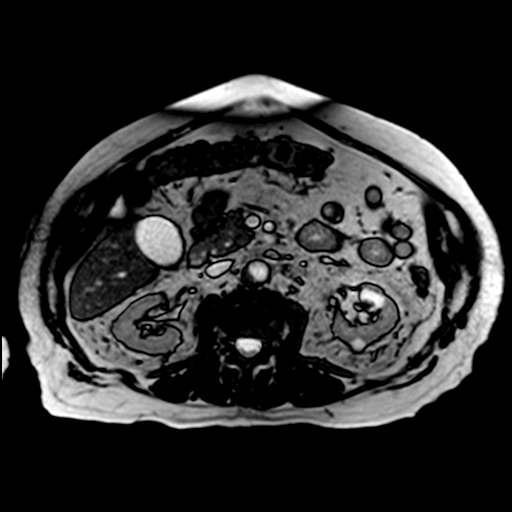
[im 70/70]
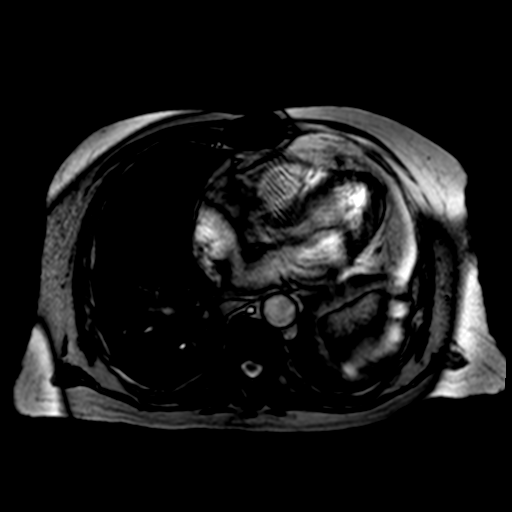

[Series 19: T2 · axial · 5.0mm · 1.56mm/px · 1 of 30 slices shown (3 of 3)]
[im 1/30]
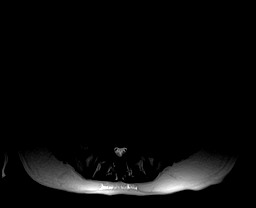

[16 of 48 positions shown; findings below may reference images not displayed]

FINDINGS: Lower chest: Unremarkable.

Hepatobiliary: No suspicious focal abnormality within the liver
parenchyma. Tiny layering gallstones evident. No intrahepatic or
extrahepatic biliary dilation.

Pancreas: No focal mass lesion. No dilatation of the main duct. No
intraparenchymal cyst. No peripancreatic edema.

Spleen:  No splenomegaly. No focal mass lesion.

Adrenals/Urinary Tract:  No adrenal nodule or mass.

1.9 cm lesion identified in the upper pole right kidney as noted on
recent CT. This lesion has intermediate signal intensity on
precontrast T1 imaging and is intermediate signal intensity on T2
imaging. Motion artifact on postcontrast imaging limits assessment
and a degree of enhancement within the superior aspect of the lesion
cannot be excluded. Simple cysts are noted in the left kidney
measuring up to 3.2 cm in the lower pole.

Stomach/Bowel: Stomach is unremarkable. No gastric wall thickening.
No evidence of outlet obstruction. No small bowel or colonic
dilatation within the visualized abdomen.

Vascular/Lymphatic: No abdominal aortic aneurysm. No abdominal
lymphadenopathy.

Other:  No intraperitoneal free fluid.

Musculoskeletal: No abnormal marrow enhancement within the
visualized bony anatomy.
IMPRESSION: 1. Assessment of the 1.9 cm right upper pole renal lesion is limited
by motion artifact. Neoplasm cannot be excluded as there may be some
low level enhancement in the cranial aspect of this lesion although
this appearance could be related to motion artifact. Close follow-up
recommended. Tissue sampling may prove helpful as clinically
warranted.
2. Left renal cyst.
3. Left nephrolithiasis seen on previous CT less well demonstrated
by MRI.
4. Cholelithiasis.

## 2019-10-04 MED ORDER — GADOBUTROL 1 MMOL/ML IV SOLN
7.0000 mL | Freq: Once | INTRAVENOUS | Status: AC | PRN
  Administered 2019-10-04: 7 mL via INTRAVENOUS

## 2019-10-25 DIAGNOSIS — N2889 Other specified disorders of kidney and ureter: Secondary | ICD-10-CM

## 2019-10-25 HISTORY — DX: Other specified disorders of kidney and ureter: N28.89

## 2019-11-01 ENCOUNTER — Other Ambulatory Visit: Payer: Self-pay | Admitting: Urology

## 2019-11-16 ENCOUNTER — Other Ambulatory Visit (HOSPITAL_COMMUNITY)
Admission: RE | Admit: 2019-11-16 | Discharge: 2019-11-16 | Disposition: A | Discharge status: Home / Self Care | Payer: Medicare Other | Source: Ambulatory Visit | Attending: Urology | Admitting: Urology

## 2019-11-16 DIAGNOSIS — Z20828 Contact with and (suspected) exposure to other viral communicable diseases: Secondary | ICD-10-CM | POA: Diagnosis not present

## 2019-11-16 DIAGNOSIS — Z01812 Encounter for preprocedural laboratory examination: Secondary | ICD-10-CM | POA: Insufficient documentation

## 2019-11-17 ENCOUNTER — Encounter (HOSPITAL_BASED_OUTPATIENT_CLINIC_OR_DEPARTMENT_OTHER): Payer: Self-pay | Admitting: Urology

## 2019-11-17 ENCOUNTER — Other Ambulatory Visit: Payer: Self-pay

## 2019-11-17 LAB — NOVEL CORONAVIRUS, NAA (HOSP ORDER, SEND-OUT TO REF LAB; TAT 18-24 HRS): SARS-CoV-2, NAA: NOT DETECTED

## 2019-11-17 NOTE — Progress Notes (Signed)
Spoke with patient via telephone for pre op interview. No solids after MN but can have clear liquids until 0830. Patient verbalized understanding of clear liquid diet. Patient will need ISTAT 8 Norva Karvonen) AM of surgery and EKG. Arrival time 1230.

## 2019-11-18 ENCOUNTER — Encounter (HOSPITAL_BASED_OUTPATIENT_CLINIC_OR_DEPARTMENT_OTHER): Payer: Self-pay | Admitting: Urology

## 2019-11-18 NOTE — Progress Notes (Signed)
Chart reviewed by anesthesia, Konrad Felix PA.  Stated noted in epic that pt is followed regularly by his pcp for bp and lipids, ok to proceed barring acute status change or recurrent cardiac symptoms.

## 2019-11-19 ENCOUNTER — Encounter (HOSPITAL_BASED_OUTPATIENT_CLINIC_OR_DEPARTMENT_OTHER)
Admission: RE | Disposition: A | Discharge status: Home / Self Care | Payer: Self-pay | Source: Home / Self Care | Attending: Urology

## 2019-11-19 ENCOUNTER — Encounter (HOSPITAL_BASED_OUTPATIENT_CLINIC_OR_DEPARTMENT_OTHER): Payer: Self-pay | Admitting: Urology

## 2019-11-19 ENCOUNTER — Ambulatory Visit (HOSPITAL_BASED_OUTPATIENT_CLINIC_OR_DEPARTMENT_OTHER)
Admission: RE | Admit: 2019-11-19 | Discharge: 2019-11-19 | Disposition: A | Discharge status: Home / Self Care | Payer: Medicare Other | Attending: Urology | Admitting: Urology

## 2019-11-19 ENCOUNTER — Other Ambulatory Visit: Payer: Self-pay

## 2019-11-19 ENCOUNTER — Ambulatory Visit (HOSPITAL_BASED_OUTPATIENT_CLINIC_OR_DEPARTMENT_OTHER): Payer: Medicare Other | Admitting: Physician Assistant

## 2019-11-19 DIAGNOSIS — N138 Other obstructive and reflux uropathy: Secondary | ICD-10-CM | POA: Insufficient documentation

## 2019-11-19 DIAGNOSIS — Z87442 Personal history of urinary calculi: Secondary | ICD-10-CM | POA: Insufficient documentation

## 2019-11-19 DIAGNOSIS — I110 Hypertensive heart disease with heart failure: Secondary | ICD-10-CM | POA: Diagnosis not present

## 2019-11-19 DIAGNOSIS — Z79899 Other long term (current) drug therapy: Secondary | ICD-10-CM | POA: Diagnosis not present

## 2019-11-19 DIAGNOSIS — Z951 Presence of aortocoronary bypass graft: Secondary | ICD-10-CM | POA: Insufficient documentation

## 2019-11-19 DIAGNOSIS — N3289 Other specified disorders of bladder: Secondary | ICD-10-CM | POA: Diagnosis not present

## 2019-11-19 DIAGNOSIS — Z87891 Personal history of nicotine dependence: Secondary | ICD-10-CM | POA: Diagnosis not present

## 2019-11-19 DIAGNOSIS — N200 Calculus of kidney: Secondary | ICD-10-CM | POA: Insufficient documentation

## 2019-11-19 DIAGNOSIS — N401 Enlarged prostate with lower urinary tract symptoms: Secondary | ICD-10-CM | POA: Insufficient documentation

## 2019-11-19 DIAGNOSIS — I2510 Atherosclerotic heart disease of native coronary artery without angina pectoris: Secondary | ICD-10-CM | POA: Diagnosis not present

## 2019-11-19 DIAGNOSIS — I509 Heart failure, unspecified: Secondary | ICD-10-CM | POA: Diagnosis not present

## 2019-11-19 HISTORY — PX: CYSTOSCOPY WITH RETROGRADE PYELOGRAM, URETEROSCOPY AND STENT PLACEMENT: SHX5789

## 2019-11-19 HISTORY — DX: Personal history of urinary calculi: Z87.442

## 2019-11-19 LAB — POCT I-STAT, CHEM 8
BUN: 17 mg/dL (ref 8–23)
Calcium, Ion: 1.18 mmol/L (ref 1.15–1.40)
Chloride: 109 mmol/L (ref 98–111)
Creatinine, Ser: 1 mg/dL (ref 0.61–1.24)
Glucose, Bld: 87 mg/dL (ref 70–99)
HCT: 47 % (ref 39.0–52.0)
Hemoglobin: 16 g/dL (ref 13.0–17.0)
Potassium: 4.5 mmol/L (ref 3.5–5.1)
Sodium: 143 mmol/L (ref 135–145)
TCO2: 27 mmol/L (ref 22–32)

## 2019-11-19 SURGERY — CYSTOURETEROSCOPY, WITH RETROGRADE PYELOGRAM AND STENT INSERTION
Anesthesia: General | Site: Pelvis | Laterality: Left

## 2019-11-19 MED ORDER — OXYCODONE-ACETAMINOPHEN 5-325 MG PO TABS: 1.0000 | ORAL_TABLET | Freq: Three times a day (TID) | ORAL | 0 refills | Status: DC | PRN

## 2019-11-19 MED ORDER — GENTAMICIN SULFATE 40 MG/ML IJ SOLN
5.0000 mg/kg | INTRAVENOUS | Status: AC
  Administered 2019-11-19: 14:00:00 410 mg via INTRAVENOUS
  Filled 2019-11-19: qty 10.25

## 2019-11-19 MED ORDER — EPHEDRINE SULFATE-NACL 50-0.9 MG/10ML-% IV SOSY
PREFILLED_SYRINGE | INTRAVENOUS | Status: DC | PRN
  Administered 2019-11-19: 10 mg via INTRAVENOUS

## 2019-11-19 MED ORDER — KETOROLAC TROMETHAMINE 10 MG PO TABS: 10.0000 mg | ORAL_TABLET | Freq: Three times a day (TID) | ORAL | 0 refills | Status: DC | PRN

## 2019-11-19 MED ORDER — PROPOFOL 10 MG/ML IV BOLUS
INTRAVENOUS | Status: AC
  Filled 2019-11-19: qty 20

## 2019-11-19 MED ORDER — PROPOFOL 10 MG/ML IV BOLUS
INTRAVENOUS | Status: DC | PRN
  Administered 2019-11-19: 120 mg via INTRAVENOUS

## 2019-11-19 MED ORDER — LIDOCAINE 2% (20 MG/ML) 5 ML SYRINGE
INTRAMUSCULAR | Status: AC
  Filled 2019-11-19: qty 5

## 2019-11-19 MED ORDER — SENNOSIDES-DOCUSATE SODIUM 8.6-50 MG PO TABS: 1.0000 | ORAL_TABLET | Freq: Two times a day (BID) | ORAL | 0 refills | Status: DC

## 2019-11-19 MED ORDER — FENTANYL CITRATE (PF) 100 MCG/2ML IJ SOLN
INTRAMUSCULAR | Status: DC | PRN
  Administered 2019-11-19: 50 ug via INTRAVENOUS

## 2019-11-19 MED ORDER — ONDANSETRON HCL 4 MG/2ML IJ SOLN
4.0000 mg | Freq: Once | INTRAMUSCULAR | Status: DC | PRN
  Filled 2019-11-19: qty 2

## 2019-11-19 MED ORDER — IOHEXOL 300 MG/ML  SOLN
INTRAMUSCULAR | Status: DC | PRN
  Administered 2019-11-19: 15:00:00 26 mL via URETHRAL

## 2019-11-19 MED ORDER — GLYCOPYRROLATE 0.2 MG/ML IJ SOLN
INTRAMUSCULAR | Status: DC | PRN
  Administered 2019-11-19 (×2): .1 mg via INTRAVENOUS

## 2019-11-19 MED ORDER — ACETAMINOPHEN 500 MG PO TABS
1000.0000 mg | ORAL_TABLET | Freq: Once | ORAL | Status: AC
  Administered 2019-11-19: 1000 mg via ORAL
  Filled 2019-11-19: qty 2

## 2019-11-19 MED ORDER — LACTATED RINGERS IV SOLN
INTRAVENOUS | Status: DC
  Filled 2019-11-19: qty 1000

## 2019-11-19 MED ORDER — ONDANSETRON HCL 4 MG/2ML IJ SOLN
INTRAMUSCULAR | Status: DC | PRN
  Administered 2019-11-19: 4 mg via INTRAVENOUS

## 2019-11-19 MED ORDER — ACETAMINOPHEN 500 MG PO TABS
ORAL_TABLET | ORAL | Status: AC
  Filled 2019-11-19: qty 2

## 2019-11-19 MED ORDER — DEXAMETHASONE SODIUM PHOSPHATE 10 MG/ML IJ SOLN
INTRAMUSCULAR | Status: DC | PRN
  Administered 2019-11-19: 5 mg via INTRAVENOUS

## 2019-11-19 MED ORDER — LIDOCAINE 2% (20 MG/ML) 5 ML SYRINGE
INTRAMUSCULAR | Status: DC | PRN
  Administered 2019-11-19: 60 mg via INTRAVENOUS

## 2019-11-19 MED ORDER — SODIUM CHLORIDE 0.9 % IR SOLN
Status: DC | PRN
  Administered 2019-11-19: 3000 mL

## 2019-11-19 MED ORDER — GENTAMICIN SULFATE 40 MG/ML IJ SOLN
5.0000 mg/kg | INTRAVENOUS | Status: DC
  Filled 2019-11-19: qty 10.5

## 2019-11-19 MED ORDER — FENTANYL CITRATE (PF) 100 MCG/2ML IJ SOLN
25.0000 ug | INTRAMUSCULAR | Status: DC | PRN
  Filled 2019-11-19: qty 1

## 2019-11-19 MED ORDER — FENTANYL CITRATE (PF) 250 MCG/5ML IJ SOLN
INTRAMUSCULAR | Status: AC
  Filled 2019-11-19: qty 5

## 2019-11-19 SURGICAL SUPPLY — 26 items
BAG DRAIN URO-CYSTO SKYTR STRL (DRAIN) ×4 IMPLANT
BAG DRN UROCATH (DRAIN) ×2
BASKET LASER NITINOL 1.9FR (BASKET) IMPLANT
BSKT STON RTRVL 120 1.9FR (BASKET)
CATH INTERMIT  6FR 70CM (CATHETERS) ×2 IMPLANT
CLOTH BEACON ORANGE TIMEOUT ST (SAFETY) ×2 IMPLANT
FIBER LASER FLEXIVA 365 (UROLOGICAL SUPPLIES) IMPLANT
FIBER LASER TRAC TIP (UROLOGICAL SUPPLIES) IMPLANT
GLOVE BIO SURGEON STRL SZ7.5 (GLOVE) ×4 IMPLANT
GOWN STRL REUS W/TWL LRG LVL3 (GOWN DISPOSABLE) ×4 IMPLANT
GUIDEWIRE ANG ZIPWIRE 038X150 (WIRE) ×4 IMPLANT
GUIDEWIRE STR DUAL SENSOR (WIRE) ×4 IMPLANT
IV NS 1000ML (IV SOLUTION)
IV NS 1000ML BAXH (IV SOLUTION) ×2 IMPLANT
IV NS IRRIG 3000ML ARTHROMATIC (IV SOLUTION) ×4 IMPLANT
KIT TURNOVER CYSTO (KITS) ×4 IMPLANT
MANIFOLD NEPTUNE II (INSTRUMENTS) ×4 IMPLANT
NS IRRIG 500ML POUR BTL (IV SOLUTION) ×4 IMPLANT
PACK CYSTO (CUSTOM PROCEDURE TRAY) ×4 IMPLANT
SHEATH URETERAL 12FRX35CM (MISCELLANEOUS) ×2 IMPLANT
STENT POLARIS 5FRX26 (STENTS) ×2 IMPLANT
SYR 10ML LL (SYRINGE) ×4 IMPLANT
TUBE CONNECTING 12'X1/4 (SUCTIONS) ×1
TUBE CONNECTING 12X1/4 (SUCTIONS) ×3 IMPLANT
TUBE FEEDING 8FR 16IN STR KANG (MISCELLANEOUS) ×2 IMPLANT
TUBING UROLOGY SET (TUBING) ×4 IMPLANT

## 2019-11-19 NOTE — Anesthesia Procedure Notes (Signed)
Procedure Name: LMA Insertion Date/Time: 11/19/2019 2:13 PM Performed by: Gwyndolyn Saxon, CRNA Pre-anesthesia Checklist: Patient identified, Emergency Drugs available, Suction available and Patient being monitored Patient Re-evaluated:Patient Re-evaluated prior to induction Oxygen Delivery Method: Circle system utilized Preoxygenation: Pre-oxygenation with 100% oxygen Induction Type: IV induction Ventilation: Mask ventilation without difficulty and Oral airway inserted - appropriate to patient size LMA: LMA with gastric port inserted LMA Size: 4.0 Number of attempts: 2 (unable to seat straight #4 lma) Placement Confirmation: positive ETCO2 and breath sounds checked- equal and bilateral Tube secured with: Tape Dental Injury: Teeth and Oropharynx as per pre-operative assessment

## 2019-11-19 NOTE — Transfer of Care (Signed)
Immediate Anesthesia Transfer of Care Note  Patient: Mitchell Chandler  Procedure(s) Performed: CYSTOSCOPY WITH RETROGRADE PYELOGRAM, DIAGNOSTIC URETEROSCOPY AND STENT PLACEMENT (Left Pelvis)  Patient Location: PACU  Anesthesia Type:General  Level of Consciousness: drowsy and patient cooperative  Airway & Oxygen Therapy: Patient Spontanous Breathing  Post-op Assessment: Report given to RN and Post -op Vital signs reviewed and stable  Post vital signs: Reviewed and stable  Last Vitals:  Vitals Value Taken Time  BP 133/64 11/19/19 1500  Temp    Pulse 68 11/19/19 1502  Resp 19 11/19/19 1502  SpO2 97 % 11/19/19 1502  Vitals shown include unvalidated device data.  Last Pain:  Vitals:   11/19/19 1258  TempSrc: Oral  PainSc: 0-No pain      Patients Stated Pain Goal: 3 ([id number])  Complications: No apparent anesthesia complications

## 2019-11-19 NOTE — Anesthesia Postprocedure Evaluation (Signed)
Anesthesia Post Note  Patient: Mitchell Chandler  Procedure(s) Performed: CYSTOSCOPY WITH RETROGRADE PYELOGRAM, DIAGNOSTIC URETEROSCOPY AND STENT PLACEMENT (Left Pelvis)     Patient location during evaluation: PACU Anesthesia Type: General Level of consciousness: awake and alert Pain management: pain level controlled Vital Signs Assessment: post-procedure vital signs reviewed and stable Respiratory status: spontaneous breathing, nonlabored ventilation, respiratory function stable and patient connected to nasal cannula oxygen Cardiovascular status: blood pressure returned to baseline and stable Postop Assessment: no apparent nausea or vomiting Anesthetic complications: no    Last Vitals:  Vitals:   11/19/19 1530 11/19/19 1545  BP: (!) 151/64 (!) 153/64  Pulse: 60 (!) 58  Resp: 14 13  Temp:    SpO2: 98% 98%    Last Pain:  Vitals:   11/19/19 1600  TempSrc:   PainSc: 0-No pain                 Tiajuana Amass

## 2019-11-19 NOTE — Anesthesia Preprocedure Evaluation (Signed)
Anesthesia Evaluation  Patient identified by MRN, date of birth, ID band Patient awake    Reviewed: Allergy & Precautions, NPO status , Patient's Chart, lab work & pertinent test results, reviewed documented beta blocker date and time   Airway Mallampati: II  TM Distance: >3 FB Neck ROM: Full    Dental  (+) Dental Advisory Given   Pulmonary former smoker,    breath sounds clear to auscultation       Cardiovascular hypertension, Pt. on medications and Pt. on home beta blockers + CABG  + dysrhythmias  Rhythm:Regular Rate:Normal     Neuro/Psych negative neurological ROS     GI/Hepatic negative GI ROS, Neg liver ROS,   Endo/Other  negative endocrine ROS  Renal/GU Renal disease     Musculoskeletal   Abdominal   Peds  Hematology negative hematology ROS (+)   Anesthesia Other Findings   Reproductive/Obstetrics                             Anesthesia Physical Anesthesia Plan  ASA: III  Anesthesia Plan: General   Post-op Pain Management:    Induction: Intravenous  PONV Risk Score and Plan: 2 and Dexamethasone, Ondansetron and Treatment may vary due to age or medical condition  Airway Management Planned: LMA  Additional Equipment:   Intra-op Plan:   Post-operative Plan: Extubation in OR  Informed Consent: I have reviewed the patients History and Physical, chart, labs and discussed the procedure including the risks, benefits and alternatives for the proposed anesthesia with the patient or authorized representative who has indicated his/her understanding and acceptance.     Dental advisory given  Plan Discussed with: CRNA  Anesthesia Plan Comments:         Anesthesia Quick Evaluation

## 2019-11-19 NOTE — H&P (Signed)
Mitchell Chandler is an 85 y.o. male.    Chief Complaint: Pre-OP LEFT 1st stage ureteroscopic stone manipulation  HPI:   1 - Small Right Renal Mass - RUP 2cm mass incidetnal on CT 2020 non-con. Attempt MRI but motion artifact prevented enhancmenet eval. Lesion tissue density. 1 artery / 1 vein right renovascular anatomy.   2 - Recurrent Urolithiasis -  Pre 2020 MET x 1 in 1970s  10/2019 - [facility] 1.4cm infundibular stone and punctate renal stone with lower pole focal hydro. 1000HU.   3 - Lower Urinary Tract Symptoms / Very Large Prostate - on doxazosin x years or obstructinve symptoms with good control Prostate vol 133m by CT ellipsoid calculation 2020.   PMH sig for mild CHF/Lasix, HTN, CAD/CABG (not limitin in years). He is retired eChief Financial Officerwith [employer] His PCP is BHendricks LimesNP with [facility]. He still dirives and lives with wife. His son in law is very involved.   Today "Mitchell Chandler" is seen to proceed with left 1st stage ureteroscopy for large stone with focal obstruction. NO interval fevers. C19 screen negative. Most recent UA without infectious parameters.     Past Medical History:  Diagnosis Date  . Heart block AV complete ([place] oct 1995   [facility] . History of kidney stones   . Hypertension   . Kidney stones     Past Surgical History:  Procedure Laterality Date  . COLONOSCOPY  10/02/2011   Procedure: COLONOSCOPY;  Surgeon: NRogene Houston MD;  [facility]: [facility];  Service: Endoscopy;  Laterality: N/A;  10:30 am  . COLONOSCOPY N/A 12/21/2014   Procedure: COLONOSCOPY;  Surgeon: NRogene Houston MD;  Location: [facility];  Service: Endoscopy;  Laterality: N/A;  1030  . CORONARY ARTERY BYPASS GRAFT  1995  . EYE SURGERY     feb 7 feb 28 [place] . KIDNEY STONE SURGERY    . PROSTATE BIOPSY      Family History  Problem Relation Age of Onset  . Heart disease Mother   . Hypertension Father   . Hypertension Brother   .  Hypertension Brother   . Hypertension Brother   . Hypertension Brother    Social History:  reports that he quit smoking about 56 years ago. He has never used smokeless tobacco. He reports that he does not drink alcohol or use drugs.  Allergies: No Known Allergies  No medications prior to admission.    No results found for this or any previous visit (from the past 48 hour(s)). No results found.  Review of Systems  Constitutional: Negative for chills and fever.  Genitourinary: Positive for flank pain.  All other systems reviewed and are negative.   Height '5\' 8"'  (1.727 m), weight 81.2 kg. Physical Exam  Constitutional: He appears well-developed.  HENT:  Head: Normocephalic.  Eyes: Pupils are equal, round, and reactive to light.  Cardiovascular: Normal rate.  Respiratory: Effort normal.  GI: Soft.  Genitourinary:    Genitourinary Comments: No CVAT at present.    Musculoskeletal:     Cervical back: Normal range of motion.  Neurological: He is alert.  Skin: Skin is warm.  Psychiatric: He has a normal mood and affect.     Assessment/Plan  Proceed as planned with LEFT 1st stage ureteroscopy . Risks, benefits, alternatives, expected peri-op course including need for peri-op stenting and staged approach discussed previously and reiterated today.   TAlexis Frock MD 11/19/2019, 7:11 AM

## 2019-11-19 NOTE — Op Note (Signed)
NAME: Mitchell Chandler, Mitchell Chandler MEDICAL RECORD [id number] ACCOUNT [id number] DATE OF BIRTH:1932-01-19 FACILITY: [facility] LOCATION: [facility] PHYSICIAN:Gaurav Baldree, MD  OPERATIVE REPORT  DATE OF PROCEDURE:  11/19/2019  PREOPERATIVE DIAGNOSIS:  Large left lower pole infundibular stone.  POSTOPERATIVE DIAGNOSIS:  Large left lower pole infundibular stone.  PROCEDURE: 1.  Cystoscopy, left retrograde pyelogram, interpretation. 2.  Left diagnostic ureteroscopy. 3.  Insertion of left ureteral stent 5 x 26 Polaris, no tether.  ESTIMATED BLOOD LOSS:  Nil.  COMPLICATIONS:  None.  SPECIMENS:  None.  FINDINGS: 1.  Inability to visualize acutely angled lower pole calyx containing stone. 2.  Successful placement of left ureteral stent proximal end renal pelvis, distal end urinary bladder.  INDICATIONS:  The patient is a pleasant 85 year old man with history of known left-sided urolithiasis.  He has had increasing bother from left flank pain for a number of months.  He was found on imaging to have a very large left lower pole infundibular  stone with apparent focal obstruction of the lower pole of the kidney with stone volume approximately 2 cm.  Options were discussed for management including observation versus staged ureteroscopy versus percutaneous approach to surgery and we agreed that  a staged ureteroscopy would be the best risk/benefit profile.  As the pain is significant and he wished to proceed, informed consent was then placed and obtained in medical record.  DESCRIPTION OF PROCEDURE:  The patient was identified.  The procedure being left first stage ureteroscopic stone stimulation was confirmed.  Procedure timeout was performed.  Intravenous antibiotics administered.  General LMA anesthesia induced.  The  patient was placed into a low lithotomy position, sterile field was created prepped and draped the patient's penis, perineum and proximal thighs using iodine.  Cystourethroscopy was performed  using a 21-French rigid cystoscope with offset lens.  The  patient had a mild glanular hypospadias, otherwise some significant bilobar prostatic hypertrophy, mild bladder trabeculation.  Left ureteral orifice appeared singleton.  It was cannulated with a 6-French renal catheter and left retrograde pyelogram was  obtained.  Left retrograde pyelogram demonstrates a single left ureter with single system left kidney.  There was a large calcification visualized in the lower pole infundibular calix area that was very acutely angled consistent with known stone.  A 0.038 ZIPwire  was advanced to the lower pole and set aside as a safety wire and an 8-French feeding tube placed in the urinary bladder for pressure release, and semirigid ureteroscopy of the distal 4/5 left ureter alongside a separate sensor working wire.  No mucosal  abnormalities were found.  The semirigid scope was then exchanged for a 12/14 medium length ureteral access sheath to the level of the proximal ureter using continuous fluoroscopic guidance and flexible digital ureteroscopy performed the proximal left  ureter and left kidney, including all accessible calices x3.  There is no evidence of successful intraluminal stone whatsoever.  The lower pole accessible calices were very carefully inspected and the presumed infundibulum containing resection stone was  unable to visualized.  This was felt to likely be secondary to mucosal overgrowth of the infundibula versus angle subacute that the ureteroscope was not able to visualize.  Multiple angulations were performed, and again the stone was not visualized.  The  access sheath was removed under continuous vision.  No significant mucosal abnormalities were found.  The ureteroscope was once again advanced without the sheath to perhaps allow for better mobility and further acute angulation and left renal pelvis was  once again inspected as  were all accessible calices and again the lower pole stone in  question was not able to be visualized.  The entire length of the ureter was very carefully inspected to help maximally rule out urethral duplication.  This was not  noted.  Preoperative imaging was also referenced and again no evidence of a duplication.  It was clearly felt that this technique would be insufficient to address the stone.  Given access sheath usage, it was felt that interval stenting would be  warranted.  As such, a new 5 x 26 Polaris-type stent was placed with remaining safety wire using fluoroscopic guidance.  Good proximal and distal planes were noted, and the procedure terminated.  The patient tolerated the procedure well.  No immediate  complications.  The patient did postanesthesia care in stable condition.  I will discuss the variant anatomy with the patient and his son-in-law and discuss further options including either observation versus percutaneous approach to surgery as the orientation of the stone in the lower pole appears more amenable to that.  TN/NUANCE  D:11/19/2019 T:11/19/2019 JOB:[id number]

## 2019-11-19 NOTE — Brief Op Note (Signed)
11/19/2019  2:47 PM  PATIENT:  Mitchell Chandler  85 y.o. male  PRE-OPERATIVE DIAGNOSIS:  LEFT RENAL STONE  POST-OPERATIVE DIAGNOSIS:  LEFT RENAL STONE  PROCEDURE:  Procedure(s) with comments: CYSTOSCOPY WITH RETROGRADE PYELOGRAM, DIAGNOSTIC URETEROSCOPY AND STENT PLACEMENT (Left) - 1  SURGEON:  Surgeon(s) and Role:    * Alexis Frock, MD - Primary  PHYSICIAN ASSISTANT:   ASSISTANTS: none   ANESTHESIA:   general  EBL:  minimal   BLOOD ADMINISTERED:none  DRAINS: none   LOCAL MEDICATIONS USED:  NONE  SPECIMEN:  No Specimen  DISPOSITION OF SPECIMEN:  N/A  COUNTS:  YES  TOURNIQUET:  * No tourniquets in log *  DICTATION: .Other Dictation: Dictation Number  [id number]  PLAN OF CARE: Discharge to home after PACU  PATIENT DISPOSITION:  PACU - hemodynamically stable.   Delay start of Pharmacological VTE agent (>24hrs) due to surgical blood loss or risk of bleeding: yes

## 2019-11-19 NOTE — Discharge Instructions (Signed)
1 - You may have urinary urgency (bladder spasms) and bloody urine on / off with stent in place. This is normal. ° °2 - Call MD or go to ER for fever >102, severe pain / nausea / vomiting not relieved by medications, or acute change in medical status ° °CYSTOSCOPY HOME CARE INSTRUCTIONS ° °Activity: °Rest for the remainder of the day.  Do not drive or operate equipment today.  You may resume normal activities in one to two days as instructed by your physician.  ° °Meals: °Drink plenty of liquids and eat light foods such as gelatin or soup this evening.  You may return to a normal meal plan tomorrow. ° °Return to Work: °You may return to work in one to two days or as instructed by your physician. ° °Special Instructions / Symptoms: °Call your physician if any of these symptoms occur: ° ° -persistent or heavy bleeding ° -bleeding which continues after first few urination ° -large blood clots that are difficult to pass ° -urine stream diminishes or stops completely ° -fever equal to or higher than 101 degrees Farenheit. ° -cloudy urine with a strong, foul odor ° -severe pain ° °Females should always wipe from front to back after elimination.  You may feel some burning pain when you urinate.  This should disappear with time.  Applying moist heat to the lower abdomen or a hot tub bath may help relieve the pain. \ ° °Follow-Up / Date of Return Visit to Your Physician:  As instructed °Call for an appointment to arrange follow-up. ° °Patient Signature:  ________________________________________________________ ° °Nurse's Signature:  ________________________________________________________ ° °Post Anesthesia Home Care Instructions ° °Activity: °Get plenty of rest for the remainder of the day. A responsible individual must stay with you for 24 hours following the procedure.  °For the next 24 hours, DO NOT: °-Drive a car °-Operate machinery °-Drink alcoholic beverages °-Take any medication unless instructed by your  physician °-Make any legal decisions or sign important papers. ° °Meals: °Start with liquid foods such as gelatin or soup. Progress to regular foods as tolerated. Avoid greasy, spicy, heavy foods. If nausea and/or vomiting occur, drink only clear liquids until the nausea and/or vomiting subsides. Call your physician if vomiting continues. ° °Special Instructions/Symptoms: °Your throat may feel dry or sore from the anesthesia or the breathing tube placed in your throat during surgery. If this causes discomfort, gargle with warm salt water. The discomfort should disappear within 24 hours. ° °If you had a scopolamine patch placed behind your ear for the management of post- operative nausea and/or vomiting: ° °1. The medication in the patch is effective for 72 hours, after which it should be removed.  Wrap patch in a tissue and discard in the trash. Wash hands thoroughly with soap and water. °2. You may remove the patch earlier than 72 hours if you experience unpleasant side effects which may include dry mouth, dizziness or visual disturbances. °3. Avoid touching the patch. Wash your hands with soap and water after contact with the patch. °   ° ° °

## 2019-12-07 ENCOUNTER — Other Ambulatory Visit: Payer: Self-pay | Admitting: Urology

## 2019-12-07 ENCOUNTER — Other Ambulatory Visit: Payer: Self-pay

## 2019-12-07 NOTE — Progress Notes (Signed)
Assessment & Plan:  1. Essential (primary) hypertension - Well controlled on current regimen.   2. Benign prostatic hyperplasia with urinary frequency - Well controlled on current regimen.   3. Dyslipidemia - Well controlled on current regimen.   4. Kidney stones - Keep appointment for upcoming procedure.  Keep follow-up appointments with urologist.  5. High priority for COVID-19 virus vaccination - Patient advised to contact his county health department to schedule himself for the COVID-19 vaccine.   Return in about 6 months (around 06/06/2020) for follow-up of chronic medication conditions (CPE w. labs).  [name], MSN, APRN, FNP-C [facility]  Subjective:    Patient ID: Mitchell Chandler, male    DOB: [birth year], 86 y.o.   MRN: [id number]  Patient Care Team: Loman Brooklyn, FNP as PCP - General (Family Medicine) Leroy Sea, MD as Referring Physician (Urology)   Chief Complaint:  Chief Complaint  Patient presents with  . Medical Management of Chronic Issues    Kidney stone, COVID19 Vaccine     HPI: Mitchell Chandler is a 86 y.o. male presenting on 12/08/2019 for Medical Management of Chronic Issues (Kidney stone, COVID19 Vaccine )  Patient has been to see the urologist due to his kidney stone and has a procedure scheduled for the end of this month for removal.  New complaints: None  Social history:  Relevant past medical, surgical, family and social history reviewed and updated as indicated. Interim medical history since our last visit reviewed.  Allergies and medications reviewed and updated.  DATA REVIEWED: CHART IN EPIC  ROS: Negative unless specifically indicated above in HPI.    Current Outpatient Medications:  .  acetaminophen (TYLENOL) 500 MG tablet, Take 500 mg by mouth every 6 (six) hours as needed., Disp: , Rfl:  .  amLODipine-benazepril (LOTREL) 10-20 MG capsule, Take 1 capsule by mouth daily., Disp: 90 capsule, Rfl: 1 .   aspirin 81 MG EC tablet, Take 81 mg by mouth daily. Swallow whole., Disp: , Rfl:  .  doxazosin (CARDURA) 8 MG tablet, Take 8 mg by mouth at bedtime.  , Disp: , Rfl:  .  Folic Acid-Vit Q000111Q 123456 (FOLBEE) 2.5-25-1 MG TABS tablet, Take 1 tablet by mouth daily., Disp: 90 tablet, Rfl: 1 .  furosemide (LASIX) 20 MG tablet, Take 1 tablet (20 mg total) by mouth daily., Disp: 90 tablet, Rfl: 1 .  metoprolol tartrate (LOPRESSOR) 50 MG tablet, Take 1 tablet (50 mg total) by mouth daily. (Patient taking differently: Take 100 mg by mouth daily. ), Disp: 90 tablet, Rfl: 1 .  simvastatin (ZOCOR) 20 MG tablet, Take 1 tablet (20 mg total) by mouth at bedtime., Disp: 90 tablet, Rfl: 1   No Known Allergies Past Medical History:  Diagnosis Date  . 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  . History of kidney stones   . Hypertension   . Kidney stones     Past Surgical History:  Procedure Laterality Date  . COLONOSCOPY  10/02/2011   Procedure: COLONOSCOPY;  Surgeon: Rogene Houston, MD;  [facility];  Service: Endoscopy;  Laterality: N/A;  10:30 am  . COLONOSCOPY N/A 12/21/2014   Procedure: COLONOSCOPY;  Surgeon: Rogene Houston, MD;  [facility];  Service: Endoscopy;  Laterality: N/A;  1030  . CORONARY ARTERY BYPASS GRAFT  1995  . CYSTOSCOPY WITH RETROGRADE PYELOGRAM, URETEROSCOPY AND STENT PLACEMENT Left 11/19/2019   Procedure: CYSTOSCOPY  WITH RETROGRADE PYELOGRAM, DIAGNOSTIC URETEROSCOPY AND STENT PLACEMENT;  Surgeon: Alexis Frock, MD;  Location: [facility];  Service: Urology;  Laterality: Left;  1  . EYE SURGERY     feb 7 feb 28 [place]  . KIDNEY STONE SURGERY    . PROSTATE BIOPSY      Social History   Socioeconomic History  . Marital status: Married    Spouse name: Not on file  . Number of children: Not on file  . Years of education: Not on file  . Highest education level: Not on file  Occupational History  . Not on file   Tobacco Use  . Smoking status: Former Smoker    Quit date: 12/21/1962    Years since quitting: 57.0  . Smokeless tobacco: Never Used  Substance and Sexual Activity  . Alcohol use: No  . Drug use: No  . Sexual activity: Not on file  Other Topics Concern  . Not on file  Social History Narrative  . Not on file   Social Determinants of Health   Financial Resource Strain:   . Difficulty of Paying Living Expenses: Not on file  Food Insecurity:   . Worried About Charity fundraiser in the Last Year: Not on file  . Ran Out of Food in the Last Year: Not on file  Transportation Needs:   . Lack of Transportation (Medical): Not on file  . Lack of Transportation (Non-Medical): Not on file  Physical Activity:   . Days of Exercise per Week: Not on file  . Minutes of Exercise per Session: Not on file  Stress:   . Feeling of Stress : Not on file  Social Connections:   . Frequency of Communication with Friends and Family: Not on file  . Frequency of Social Gatherings with Friends and Family: Not on file  . Attends Religious Services: Not on file  . Active Member of Clubs or Organizations: Not on file  . Attends Archivist Meetings: Not on file  . Marital Status: Not on file  Intimate Partner Violence:   . Fear of Current or Ex-Partner: Not on file  . Emotionally Abused: Not on file  . Physically Abused: Not on file  . Sexually Abused: Not on file        Objective:    BP (!) 129/56   Pulse 61   Temp 98 F (36.7 C) (Temporal)   Ht 5\' 8"  (1.727 m)   Wt 177 lb 12.8 oz (80.6 kg)   SpO2 98%   BMI 27.03 kg/m   Physical Exam Vitals reviewed.  Constitutional:      General: He is not in acute distress.    Appearance: Normal appearance. He is overweight. He is not ill-appearing, toxic-appearing or diaphoretic.  HENT:     Head: Normocephalic and atraumatic.  Eyes:     General: No scleral icterus.       Right eye: No discharge.        Left eye: No discharge.      Conjunctiva/sclera: Conjunctivae normal.  Cardiovascular:     Rate and Rhythm: Normal rate and regular rhythm.     Heart sounds: Normal heart sounds. No murmur. No friction rub. No gallop.   Pulmonary:     Effort: Pulmonary effort is normal. No respiratory distress.     Breath sounds: Normal breath sounds. No stridor. No wheezing, rhonchi or rales.  Musculoskeletal:        General: Normal range of motion.  Cervical back: Normal range of motion.  Skin:    General: Skin is warm and dry.  Neurological:     Mental Status: He is alert and oriented to person, place, and time. Mental status is at baseline.  Psychiatric:        Mood and Affect: Mood normal.        Behavior: Behavior normal.        Thought Content: Thought content normal.        Judgment: Judgment normal.     No results found for: TSH Lab Results  Component Value Date   HGB 16.0 11/19/2019   HCT 47.0 11/19/2019   Lab Results  Component Value Date   NA 143 11/19/2019   K 4.5 11/19/2019   CO2 22 06/25/2019   GLUCOSE 87 11/19/2019   BUN 17 11/19/2019   CREATININE 1.00 11/19/2019   BILITOT 0.6 06/25/2019   ALKPHOS 69 06/25/2019   AST 23 06/25/2019   ALT 17 06/25/2019   PROT 6.3 06/25/2019   ALBUMIN 4.0 06/25/2019   CALCIUM 9.4 06/25/2019   No results found for: CHOL No results found for: HDL No results found for: LDLCALC No results found for: TRIG No results found for: CHOLHDL No results found for: HGBA1C

## 2019-12-08 ENCOUNTER — Other Ambulatory Visit: Payer: Self-pay

## 2019-12-08 ENCOUNTER — Ambulatory Visit (INDEPENDENT_AMBULATORY_CARE_PROVIDER_SITE_OTHER): Payer: Medicare Other | Admitting: Family Medicine

## 2019-12-08 VITALS — BP 129/56 | HR 61 | Temp 98.0°F | Ht 68.0 in | Wt 177.8 lb

## 2019-12-08 DIAGNOSIS — N200 Calculus of kidney: Secondary | ICD-10-CM | POA: Diagnosis not present

## 2019-12-08 DIAGNOSIS — N401 Enlarged prostate with lower urinary tract symptoms: Secondary | ICD-10-CM

## 2019-12-08 DIAGNOSIS — I10 Essential (primary) hypertension: Secondary | ICD-10-CM

## 2019-12-08 DIAGNOSIS — E785 Hyperlipidemia, unspecified: Secondary | ICD-10-CM | POA: Diagnosis not present

## 2019-12-08 DIAGNOSIS — R350 Frequency of micturition: Secondary | ICD-10-CM

## 2019-12-08 DIAGNOSIS — Z23 Encounter for immunization: Secondary | ICD-10-CM

## 2019-12-10 ENCOUNTER — Ambulatory Visit (HOSPITAL_BASED_OUTPATIENT_CLINIC_OR_DEPARTMENT_OTHER): Admit: 2019-12-10 | Payer: PRIVATE HEALTH INSURANCE | Admitting: Urology

## 2019-12-10 ENCOUNTER — Encounter (HOSPITAL_BASED_OUTPATIENT_CLINIC_OR_DEPARTMENT_OTHER): Payer: Self-pay

## 2019-12-10 SURGERY — CYSTOURETEROSCOPY, WITH RETROGRADE PYELOGRAM AND STENT INSERTION
Anesthesia: General | Laterality: Left

## 2019-12-12 ENCOUNTER — Encounter: Payer: Self-pay | Admitting: Family Medicine

## 2019-12-12 MED ORDER — SIMVASTATIN 20 MG PO TABS: 20.0000 mg | ORAL_TABLET | Freq: Every day | ORAL | 1 refills | Status: DC

## 2019-12-12 MED ORDER — AMLODIPINE BESY-BENAZEPRIL HCL 10-20 MG PO CAPS: 1.0000 | ORAL_CAPSULE | Freq: Every day | ORAL | 1 refills | Status: DC

## 2019-12-12 MED ORDER — FUROSEMIDE 20 MG PO TABS: 20.0000 mg | ORAL_TABLET | Freq: Every day | ORAL | 1 refills | Status: DC

## 2019-12-12 MED ORDER — METOPROLOL TARTRATE 50 MG PO TABS: 100.0000 mg | ORAL_TABLET | Freq: Every day | ORAL | 1 refills | Status: DC

## 2019-12-12 MED ORDER — FOLIC ACID-VIT B6-VIT B12 2.5-25-1 MG PO TABS: 1.0000 | ORAL_TABLET | Freq: Every day | ORAL | 1 refills | Status: DC

## 2019-12-30 ENCOUNTER — Ambulatory Visit (INDEPENDENT_AMBULATORY_CARE_PROVIDER_SITE_OTHER): Payer: Medicare Other | Admitting: Gastroenterology

## 2019-12-31 ENCOUNTER — Inpatient Hospital Stay: Admit: 2019-12-31 | Payer: Medicare Other | Admitting: Urology

## 2019-12-31 SURGERY — NEPHROLITHOTOMY PERCUTANEOUS
Anesthesia: General | Laterality: Left

## 2020-01-19 ENCOUNTER — Other Ambulatory Visit: Payer: Self-pay | Admitting: Urology

## 2020-01-20 NOTE — Patient Instructions (Signed)
DUE TO COVID-19 ONLY ONE VISITOR IS ALLOWED TO COME WITH YOU AND STAY IN THE WAITING ROOM ONLY DURING PRE OP AND PROCEDURE DAY OF SURGERY. THE 1 VISITOR MAY VISIT WITH YOU AFTER SURGERY IN YOUR PRIVATE ROOM DURING VISITING HOURS ONLY!  YOU NEED TO HAVE A COVID 19 TEST ON__Saturday 02/20/2021_____ @___11 :10 am____, THIS TEST MUST BE DONE BEFORE SURGERY, COME  [place], [place] , [address].  ([place]) ONCE YOUR COVID TEST IS COMPLETED, PLEASE BEGIN THE QUARANTINE INSTRUCTIONS AS OUTLINED IN YOUR HANDOUT.                [place]     Your procedure is scheduled on: Wednesday 01/26/2020   Report to [facility] Main  Entrance    Report to admitting at  1000 AM     Call this number if you have problems the morning of surgery [phone]    Remember: Do not eat food or drink liquids :After Midnight.      BRUSH YOUR TEETH MORNING OF SURGERY AND RINSE YOUR MOUTH OUT, NO CHEWING GUM CANDY OR MINTS.     Take these medicines the morning of surgery with A SIP OF WATER:  Metoprolol tartrate (Lopressor)                                 You may not have any metal on your body including hair pins and              piercings  Do not wear jewelry, make-up, lotions, powders or perfumes, deodorant                        Men may shave face and neck.   Do not bring valuables to the hospital. [place].  Contacts, dentures or bridgework may not be worn into surgery.  Leave suitcase in the car. After surgery it may be brought to your room.                  Please read over the following fact sheets you were given: _____________________________________________________________________             [facility] - Preparing for Surgery Before surgery, you can play an important role.  Because skin is not sterile, your skin needs to be as free of germs as possible.  You can reduce the number of germs on your  skin by washing with CHG (chlorahexidine gluconate) soap before surgery.  CHG is an antiseptic cleaner which kills germs and bonds with the skin to continue killing germs even after washing. Please DO NOT use if you have an allergy to CHG or antibacterial soaps.  If your skin becomes reddened/irritated stop using the CHG and inform your nurse when you arrive at Short Stay. Do not shave (including legs and underarms) for at least 48 hours prior to the first CHG shower.  You may shave your face/neck. Please follow these instructions carefully:  1.  Shower with CHG Soap the night before surgery and the  morning of Surgery.  2.  If you choose to wash your hair, wash your hair first as usual with your  normal  shampoo.  3.  After you shampoo, rinse your hair and body thoroughly to remove the  shampoo.                           4.  Use CHG as you would any other liquid soap.  You can apply chg directly  to the skin and wash                       Gently with a scrungie or clean washcloth.  5.  Apply the CHG Soap to your body ONLY FROM THE NECK DOWN.   Do not use on face/ open                           Wound or open sores. Avoid contact with eyes, ears mouth and genitals (private parts).                       Wash face,  Genitals (private parts) with your normal soap.             6.  Wash thoroughly, paying special attention to the area where your surgery  will be performed.  7.  Thoroughly rinse your body with warm water from the neck down.  8.  DO NOT shower/wash with your normal soap after using and rinsing off  the CHG Soap.                9.  Pat yourself dry with a clean towel.            10.  Wear clean pajamas.            11.  Place clean sheets on your bed the night of your first shower and do not  sleep with pets. Day of Surgery : Do not apply any lotions/deodorants the morning of surgery.  Please wear clean clothes to the hospital/surgery center.  FAILURE TO FOLLOW THESE INSTRUCTIONS MAY  RESULT IN THE CANCELLATION OF YOUR SURGERY PATIENT SIGNATURE_________________________________  NURSE SIGNATURE__________________________________  ________________________________________________________________________

## 2020-01-21 ENCOUNTER — Encounter (HOSPITAL_COMMUNITY)
Admission: RE | Admit: 2020-01-21 | Discharge: 2020-01-21 | Disposition: A | Discharge status: Home / Self Care | Payer: Medicare Other | Source: Ambulatory Visit | Attending: Urology | Admitting: Urology

## 2020-01-21 ENCOUNTER — Other Ambulatory Visit: Payer: Self-pay

## 2020-01-21 ENCOUNTER — Encounter (HOSPITAL_COMMUNITY): Payer: Self-pay

## 2020-01-21 DIAGNOSIS — I10 Essential (primary) hypertension: Secondary | ICD-10-CM | POA: Diagnosis not present

## 2020-01-21 DIAGNOSIS — Z87891 Personal history of nicotine dependence: Secondary | ICD-10-CM | POA: Diagnosis not present

## 2020-01-21 DIAGNOSIS — Z79899 Other long term (current) drug therapy: Secondary | ICD-10-CM | POA: Insufficient documentation

## 2020-01-21 DIAGNOSIS — N200 Calculus of kidney: Secondary | ICD-10-CM | POA: Insufficient documentation

## 2020-01-21 DIAGNOSIS — Z01812 Encounter for preprocedural laboratory examination: Secondary | ICD-10-CM | POA: Insufficient documentation

## 2020-01-21 LAB — CBC
HCT: 45.6 % (ref 39.0–52.0)
Hemoglobin: 15.3 g/dL (ref 13.0–17.0)
MCH: 32.1 pg (ref 26.0–34.0)
MCHC: 33.6 g/dL (ref 30.0–36.0)
MCV: 95.6 fL (ref 80.0–100.0)
Platelets: 230 10*3/uL (ref 150–400)
RBC: 4.77 MIL/uL (ref 4.22–5.81)
RDW: 12.7 % (ref 11.5–15.5)
WBC: 8.5 10*3/uL (ref 4.0–10.5)
nRBC: 0 % (ref 0.0–0.2)

## 2020-01-21 LAB — BASIC METABOLIC PANEL WITH GFR
Anion gap: 9 (ref 5–15)
BUN: 19 mg/dL (ref 8–23)
CO2: 24 mmol/L (ref 22–32)
Calcium: 9.3 mg/dL (ref 8.9–10.3)
Chloride: 109 mmol/L (ref 98–111)
Creatinine, Ser: 1.11 mg/dL (ref 0.61–1.24)
GFR calc Af Amer: >60 mL/min
GFR calc non Af Amer: >60 mL/min
Glucose, Bld: 92 mg/dL (ref 70–99)
Potassium: 4.4 mmol/L (ref 3.5–5.1)
Sodium: 142 mmol/L (ref 135–145)

## 2020-01-21 NOTE — Pre-Procedure Instructions (Signed)
PCP -  [name] FNP Cardiologist -   Chest x-Taher -  EKG - 11/19/2019 Stress Test -  ECHO -  Cardiac Cath -   Sleep Study -  CPAP -   Fasting Blood Sugar -  Checks Blood Sugar _____ times a day  Blood Thinner Instructions: Aspirin Instructions: 81mg  Last Dose: 01/20/2020  Anesthesia review:   Patient denies shortness of breath, fever, cough and chest pain at PAT appointment   Patient verbalized understanding of instructions that were given to them at the PAT appointment. Patient was also instructed that they will need to review over the PAT instructions again at home before surgery.

## 2020-01-22 ENCOUNTER — Other Ambulatory Visit (HOSPITAL_COMMUNITY)
Admission: RE | Admit: 2020-01-22 | Discharge: 2020-01-22 | Disposition: A | Discharge status: Home / Self Care | Payer: Medicare Other | Source: Ambulatory Visit | Attending: Urology | Admitting: Urology

## 2020-01-22 DIAGNOSIS — Z01812 Encounter for preprocedural laboratory examination: Secondary | ICD-10-CM | POA: Diagnosis present

## 2020-01-22 DIAGNOSIS — Z20822 Contact with and (suspected) exposure to covid-19: Secondary | ICD-10-CM | POA: Insufficient documentation

## 2020-01-22 LAB — SARS CORONAVIRUS 2 (TAT 6-24 HRS): SARS Coronavirus 2: NEGATIVE

## 2020-01-26 ENCOUNTER — Inpatient Hospital Stay (HOSPITAL_COMMUNITY): Payer: Medicare Other | Admitting: Physician Assistant

## 2020-01-26 ENCOUNTER — Encounter (HOSPITAL_COMMUNITY)
Admission: RE | Disposition: A | Discharge status: Home / Self Care | Payer: Self-pay | Source: Home / Self Care | Attending: Urology

## 2020-01-26 ENCOUNTER — Inpatient Hospital Stay (HOSPITAL_COMMUNITY): Payer: Medicare Other

## 2020-01-26 ENCOUNTER — Observation Stay (HOSPITAL_COMMUNITY)
Admission: RE | Admit: 2020-01-26 | Discharge: 2020-01-27 | Disposition: A | Discharge status: Home / Self Care | Payer: Medicare Other | Attending: Urology | Admitting: Urology

## 2020-01-26 ENCOUNTER — Encounter (HOSPITAL_COMMUNITY): Payer: Self-pay | Admitting: Urology

## 2020-01-26 ENCOUNTER — Inpatient Hospital Stay (HOSPITAL_COMMUNITY): Payer: Medicare Other | Admitting: Anesthesiology

## 2020-01-26 ENCOUNTER — Other Ambulatory Visit: Payer: Self-pay

## 2020-01-26 DIAGNOSIS — N200 Calculus of kidney: Secondary | ICD-10-CM | POA: Diagnosis present

## 2020-01-26 DIAGNOSIS — I110 Hypertensive heart disease with heart failure: Secondary | ICD-10-CM | POA: Diagnosis not present

## 2020-01-26 DIAGNOSIS — G8929 Other chronic pain: Secondary | ICD-10-CM | POA: Diagnosis not present

## 2020-01-26 DIAGNOSIS — N401 Enlarged prostate with lower urinary tract symptoms: Secondary | ICD-10-CM | POA: Diagnosis not present

## 2020-01-26 DIAGNOSIS — N132 Hydronephrosis with renal and ureteral calculous obstruction: Principal | ICD-10-CM | POA: Insufficient documentation

## 2020-01-26 DIAGNOSIS — Z951 Presence of aortocoronary bypass graft: Secondary | ICD-10-CM | POA: Insufficient documentation

## 2020-01-26 DIAGNOSIS — R109 Unspecified abdominal pain: Secondary | ICD-10-CM | POA: Insufficient documentation

## 2020-01-26 DIAGNOSIS — Z87442 Personal history of urinary calculi: Secondary | ICD-10-CM | POA: Insufficient documentation

## 2020-01-26 DIAGNOSIS — Z8249 Family history of ischemic heart disease and other diseases of the circulatory system: Secondary | ICD-10-CM | POA: Insufficient documentation

## 2020-01-26 DIAGNOSIS — I2510 Atherosclerotic heart disease of native coronary artery without angina pectoris: Secondary | ICD-10-CM | POA: Diagnosis not present

## 2020-01-26 DIAGNOSIS — Z87891 Personal history of nicotine dependence: Secondary | ICD-10-CM | POA: Insufficient documentation

## 2020-01-26 DIAGNOSIS — I509 Heart failure, unspecified: Secondary | ICD-10-CM | POA: Insufficient documentation

## 2020-01-26 HISTORY — PX: NEPHROLITHOTOMY: SHX5134

## 2020-01-26 HISTORY — PX: CYSTOSCOPY W/ URETERAL STENT PLACEMENT: SHX1429

## 2020-01-26 HISTORY — DX: Calculus of kidney: N20.0

## 2020-01-26 LAB — HEMOGLOBIN AND HEMATOCRIT, BLOOD
HCT: 42.6 % (ref 39.0–52.0)
Hemoglobin: 14 g/dL (ref 13.0–17.0)

## 2020-01-26 SURGERY — NEPHROLITHOTOMY PERCUTANEOUS
Anesthesia: General | Site: Urethra | Laterality: Left

## 2020-01-26 MED ORDER — DOXAZOSIN MESYLATE 2 MG PO TABS
8.0000 mg | ORAL_TABLET | Freq: Every day | ORAL | Status: DC
  Administered 2020-01-27: 8 mg via ORAL
  Filled 2020-01-26: qty 4

## 2020-01-26 MED ORDER — ONDANSETRON HCL 4 MG/2ML IJ SOLN
INTRAMUSCULAR | Status: DC | PRN
  Administered 2020-01-26: 4 mg via INTRAVENOUS

## 2020-01-26 MED ORDER — PHENYLEPHRINE 40 MCG/ML (10ML) SYRINGE FOR IV PUSH (FOR BLOOD PRESSURE SUPPORT)
PREFILLED_SYRINGE | INTRAVENOUS | Status: AC
  Filled 2020-01-26: qty 10

## 2020-01-26 MED ORDER — SIMVASTATIN 20 MG PO TABS
20.0000 mg | ORAL_TABLET | Freq: Every day | ORAL | Status: DC
  Administered 2020-01-27: 20 mg via ORAL
  Filled 2020-01-26: qty 1

## 2020-01-26 MED ORDER — AMLODIPINE BESYLATE 10 MG PO TABS
10.0000 mg | ORAL_TABLET | Freq: Once | ORAL | Status: DC
  Filled 2020-01-26: qty 1

## 2020-01-26 MED ORDER — LIDOCAINE 2% (20 MG/ML) 5 ML SYRINGE
INTRAMUSCULAR | Status: AC
  Filled 2020-01-26: qty 5

## 2020-01-26 MED ORDER — EPHEDRINE 5 MG/ML INJ
INTRAVENOUS | Status: AC
  Filled 2020-01-26: qty 10

## 2020-01-26 MED ORDER — ONDANSETRON HCL 4 MG/2ML IJ SOLN: 4.0000 mg | Freq: Once | INTRAMUSCULAR | Status: DC | PRN

## 2020-01-26 MED ORDER — FENTANYL CITRATE (PF) 100 MCG/2ML IJ SOLN
INTRAMUSCULAR | Status: DC | PRN
  Administered 2020-01-26 (×2): 50 ug via INTRAVENOUS

## 2020-01-26 MED ORDER — GENTAMICIN SULFATE 40 MG/ML IJ SOLN
380.0000 mg | INTRAVENOUS | Status: AC
  Administered 2020-01-26: 380 mg via INTRAVENOUS
  Filled 2020-01-26: qty 9.5

## 2020-01-26 MED ORDER — GLYCOPYRROLATE 0.2 MG/ML IJ SOLN
INTRAMUSCULAR | Status: DC | PRN
  Administered 2020-01-26: .1 mg via INTRAVENOUS

## 2020-01-26 MED ORDER — FENTANYL CITRATE (PF) 100 MCG/2ML IJ SOLN
INTRAMUSCULAR | Status: AC
  Administered 2020-01-26: 25 ug via INTRAVENOUS
  Filled 2020-01-26: qty 2

## 2020-01-26 MED ORDER — IOHEXOL 300 MG/ML  SOLN
INTRAMUSCULAR | Status: DC | PRN
  Administered 2020-01-26: 15:00:00 60 mL via URETHRAL

## 2020-01-26 MED ORDER — PHENYLEPHRINE HCL (PRESSORS) 10 MG/ML IV SOLN
INTRAVENOUS | Status: AC
  Filled 2020-01-26: qty 1

## 2020-01-26 MED ORDER — SODIUM CHLORIDE 0.9 % IR SOLN
Status: DC | PRN
  Administered 2020-01-26: 18000 mL

## 2020-01-26 MED ORDER — PHENYLEPHRINE HCL (PRESSORS) 10 MG/ML IV SOLN
INTRAVENOUS | Status: DC | PRN
  Administered 2020-01-26: 80 ug via INTRAVENOUS
  Administered 2020-01-26: 120 ug via INTRAVENOUS
  Administered 2020-01-26: 60 ug via INTRAVENOUS

## 2020-01-26 MED ORDER — AMLODIPINE BESY-BENAZEPRIL HCL 10-20 MG PO CAPS: 1.0000 | ORAL_CAPSULE | Freq: Every day | ORAL | Status: DC

## 2020-01-26 MED ORDER — MIDAZOLAM HCL 5 MG/5ML IJ SOLN
INTRAMUSCULAR | Status: DC | PRN
  Administered 2020-01-26: 1 mg via INTRAVENOUS

## 2020-01-26 MED ORDER — ACETAMINOPHEN 500 MG PO TABS
1000.0000 mg | ORAL_TABLET | Freq: Three times a day (TID) | ORAL | Status: AC
  Administered 2020-01-26 – 2020-01-27 (×3): 1000 mg via ORAL
  Filled 2020-01-26 (×3): qty 2

## 2020-01-26 MED ORDER — LIDOCAINE HCL (CARDIAC) PF 100 MG/5ML IV SOSY
PREFILLED_SYRINGE | INTRAVENOUS | Status: DC | PRN
  Administered 2020-01-26: 50 mg via INTRAVENOUS

## 2020-01-26 MED ORDER — PHENYLEPHRINE HCL-NACL 10-0.9 MG/250ML-% IV SOLN
INTRAVENOUS | Status: DC | PRN
  Administered 2020-01-26: 10 ug/min via INTRAVENOUS

## 2020-01-26 MED ORDER — MIDAZOLAM HCL 2 MG/2ML IJ SOLN
INTRAMUSCULAR | Status: AC
  Filled 2020-01-26: qty 2

## 2020-01-26 MED ORDER — DEXAMETHASONE SODIUM PHOSPHATE 10 MG/ML IJ SOLN
INTRAMUSCULAR | Status: AC
  Filled 2020-01-26: qty 1

## 2020-01-26 MED ORDER — BENAZEPRIL HCL 10 MG PO TABS
20.0000 mg | ORAL_TABLET | Freq: Every day | ORAL | Status: DC
  Administered 2020-01-27: 20 mg via ORAL
  Filled 2020-01-26: qty 2

## 2020-01-26 MED ORDER — SUGAMMADEX SODIUM 200 MG/2ML IV SOLN
INTRAVENOUS | Status: DC | PRN
  Administered 2020-01-26: 200 mg via INTRAVENOUS

## 2020-01-26 MED ORDER — HYDROMORPHONE HCL 1 MG/ML IJ SOLN: 0.5000 mg | INTRAMUSCULAR | Status: DC | PRN

## 2020-01-26 MED ORDER — METOPROLOL TARTRATE 50 MG PO TABS
50.0000 mg | ORAL_TABLET | Freq: Two times a day (BID) | ORAL | Status: DC
  Administered 2020-01-27: 50 mg via ORAL
  Filled 2020-01-26 (×2): qty 1

## 2020-01-26 MED ORDER — GLYCOPYRROLATE PF 0.2 MG/ML IJ SOSY
PREFILLED_SYRINGE | INTRAMUSCULAR | Status: AC
  Filled 2020-01-26: qty 1

## 2020-01-26 MED ORDER — ROCURONIUM BROMIDE 100 MG/10ML IV SOLN
INTRAVENOUS | Status: DC | PRN
  Administered 2020-01-26: 60 mg via INTRAVENOUS

## 2020-01-26 MED ORDER — EPHEDRINE SULFATE 50 MG/ML IJ SOLN
INTRAMUSCULAR | Status: DC | PRN
  Administered 2020-01-26 (×4): 10 mg via INTRAVENOUS

## 2020-01-26 MED ORDER — FENTANYL CITRATE (PF) 100 MCG/2ML IJ SOLN
25.0000 ug | INTRAMUSCULAR | Status: DC | PRN
  Administered 2020-01-26: 25 ug via INTRAVENOUS

## 2020-01-26 MED ORDER — PROPOFOL 10 MG/ML IV BOLUS
INTRAVENOUS | Status: AC
  Filled 2020-01-26: qty 20

## 2020-01-26 MED ORDER — SODIUM CHLORIDE 0.9 % IV SOLN: INTRAVENOUS | Status: DC

## 2020-01-26 MED ORDER — AMLODIPINE BESYLATE 10 MG PO TABS
10.0000 mg | ORAL_TABLET | Freq: Every day | ORAL | Status: DC
  Administered 2020-01-27: 10 mg via ORAL
  Filled 2020-01-26: qty 1

## 2020-01-26 MED ORDER — ROCURONIUM BROMIDE 10 MG/ML (PF) SYRINGE
PREFILLED_SYRINGE | INTRAVENOUS | Status: AC
  Filled 2020-01-26: qty 10

## 2020-01-26 MED ORDER — SENNOSIDES-DOCUSATE SODIUM 8.6-50 MG PO TABS
1.0000 | ORAL_TABLET | Freq: Two times a day (BID) | ORAL | Status: DC
  Filled 2020-01-26 (×2): qty 1

## 2020-01-26 MED ORDER — FUROSEMIDE 20 MG PO TABS
20.0000 mg | ORAL_TABLET | Freq: Every day | ORAL | Status: DC
  Administered 2020-01-27: 20 mg via ORAL
  Filled 2020-01-26: qty 1

## 2020-01-26 MED ORDER — DEXAMETHASONE SODIUM PHOSPHATE 10 MG/ML IJ SOLN
INTRAMUSCULAR | Status: DC | PRN
  Administered 2020-01-26: 4 mg via INTRAVENOUS

## 2020-01-26 MED ORDER — PROPOFOL 10 MG/ML IV BOLUS
INTRAVENOUS | Status: DC | PRN
  Administered 2020-01-26: 120 mg via INTRAVENOUS

## 2020-01-26 MED ORDER — FENTANYL CITRATE (PF) 250 MCG/5ML IJ SOLN
INTRAMUSCULAR | Status: AC
  Filled 2020-01-26: qty 5

## 2020-01-26 MED ORDER — LACTATED RINGERS IV SOLN: INTRAVENOUS | Status: DC

## 2020-01-26 MED ORDER — ONDANSETRON HCL 4 MG/2ML IJ SOLN
INTRAMUSCULAR | Status: AC
  Filled 2020-01-26: qty 2

## 2020-01-26 MED ORDER — TRAMADOL HCL 50 MG PO TABS: 50.0000 mg | ORAL_TABLET | Freq: Four times a day (QID) | ORAL | Status: DC | PRN

## 2020-01-26 MED ORDER — ACETAMINOPHEN 500 MG PO TABS
1000.0000 mg | ORAL_TABLET | Freq: Once | ORAL | Status: AC
  Administered 2020-01-26: 11:00:00 1000 mg via ORAL
  Filled 2020-01-26: qty 2

## 2020-01-26 SURGICAL SUPPLY — 70 items
APL PRP STRL LF DISP 70% ISPRP (MISCELLANEOUS) ×4
APL SKNCLS STERI-STRIP NONHPOA (GAUZE/BANDAGES/DRESSINGS) ×4
BAG DRN RND TRDRP ANRFLXCHMBR (UROLOGICAL SUPPLIES) ×4
BAG URINE DRAIN 2000ML AR STRL (UROLOGICAL SUPPLIES) ×6 IMPLANT
BAG URO CATCHER STRL LF (MISCELLANEOUS) ×3 IMPLANT
BASKET LASER NITINOL 1.9FR (BASKET) ×3 IMPLANT
BASKET ZERO TIP NITINOL 2.4FR (BASKET) ×3 IMPLANT
BENZOIN TINCTURE PRP APPL 2/3 (GAUZE/BANDAGES/DRESSINGS) ×6 IMPLANT
BLADE SURG 15 STRL LF DISP TIS (BLADE) ×2 IMPLANT
BLADE SURG 15 STRL SS (BLADE) ×3
BSKT STON RTRVL 120 1.9FR (BASKET) ×2
BSKT STON RTRVL ZERO TP 2.4FR (BASKET) ×2
CATH FOLEY 2W COUNCIL 20FR 5CC (CATHETERS) IMPLANT
CATH FOLEY 2WAY SLVR  5CC 16FR (CATHETERS) ×1
CATH FOLEY 2WAY SLVR 5CC 16FR (CATHETERS) ×2 IMPLANT
CATH IMAGER II 65CM (CATHETERS) ×3 IMPLANT
CATH INTERMIT  6FR 70CM (CATHETERS) ×3 IMPLANT
CATH MULTI PURPOSE 16FR DRAIN (CATHETERS) ×3 IMPLANT
CATH ROBINSON RED A/P 20FR (CATHETERS) IMPLANT
CATH ULTRATHANE 10.2FR (CATHETERS) ×1 IMPLANT
CATH ULTRATHANE 14FR (CATHETERS) ×3 IMPLANT
CATH X-FORCE N30 NEPHROSTOMY (TUBING) ×3 IMPLANT
CHLORAPREP W/TINT 26 (MISCELLANEOUS) ×6 IMPLANT
CLOTH BEACON ORANGE TIMEOUT ST (SAFETY) ×3 IMPLANT
COVER WAND RF STERILE (DRAPES) IMPLANT
DRAPE C-ARM 42X120 X-RAY (DRAPES) ×3 IMPLANT
DRAPE LINGEMAN PERC (DRAPES) ×3 IMPLANT
DRAPE SHEET LG 3/4 BI-LAMINATE (DRAPES) ×3 IMPLANT
DRAPE SURG IRRIG POUCH 19X23 (DRAPES) ×3 IMPLANT
DRSG PAD ABDOMINAL 8X10 ST (GAUZE/BANDAGES/DRESSINGS) ×6 IMPLANT
DRSG TEGADERM 8X12 (GAUZE/BANDAGES/DRESSINGS) ×6 IMPLANT
FIBER LASER FLEXIVA 1000 (UROLOGICAL SUPPLIES) IMPLANT
FIBER LASER FLEXIVA 365 (UROLOGICAL SUPPLIES) IMPLANT
FIBER LASER FLEXIVA 550 (UROLOGICAL SUPPLIES) IMPLANT
FIBER LASER TRAC TIP (UROLOGICAL SUPPLIES) IMPLANT
GAUZE SPONGE 4X4 12PLY STRL (GAUZE/BANDAGES/DRESSINGS) ×3 IMPLANT
GLOVE BIOGEL M STRL SZ7.5 (GLOVE) ×9 IMPLANT
GOWN STRL REUS W/TWL LRG LVL3 (GOWN DISPOSABLE) ×6 IMPLANT
GUIDEWIRE AMPLAZ .035X145 (WIRE) ×6 IMPLANT
GUIDEWIRE ANG ZIPWIRE 038X150 (WIRE) ×6 IMPLANT
GUIDEWIRE STR DUAL SENSOR (WIRE) IMPLANT
IV SET EXTENSION CATH 6 NF (IV SETS) ×3 IMPLANT
KIT BASIN OR (CUSTOM PROCEDURE TRAY) ×3 IMPLANT
KIT PROBE 340X3.4XDISP GRN (MISCELLANEOUS) IMPLANT
KIT PROBE TRILOGY 3.4X340 (MISCELLANEOUS)
KIT PROBE TRILOGY 3.9X350 (MISCELLANEOUS) ×3 IMPLANT
KIT TURNOVER KIT A (KITS) IMPLANT
MANIFOLD NEPTUNE II (INSTRUMENTS) ×3 IMPLANT
NDL TROCAR 18X15 ECHO (NEEDLE) IMPLANT
NDL TROCAR 18X20 (NEEDLE) IMPLANT
NEEDLE TROCAR 18X15 ECHO (NEEDLE) ×3 IMPLANT
NEEDLE TROCAR 18X20 (NEEDLE) IMPLANT
NS IRRIG 1000ML POUR BTL (IV SOLUTION) ×3 IMPLANT
PACK CYSTO (CUSTOM PROCEDURE TRAY) ×3 IMPLANT
SHEATH PEELAWAY SET 9 (SHEATH) ×3 IMPLANT
SPONGE LAP 4X18 RFD (DISPOSABLE) ×3 IMPLANT
SUT SILK 2 0 30  PSL (SUTURE) ×1
SUT SILK 2 0 30 PSL (SUTURE) ×2 IMPLANT
SYR 10ML LL (SYRINGE) ×3 IMPLANT
SYR 20ML LL LF (SYRINGE) ×6 IMPLANT
SYR 50ML LL SCALE MARK (SYRINGE) ×3 IMPLANT
TOWEL OR 17X26 10 PK STRL BLUE (TOWEL DISPOSABLE) ×3 IMPLANT
TRAY FOLEY MTR SLVR 16FR STAT (SET/KITS/TRAYS/PACK) ×3 IMPLANT
TUBE CONNECTING VINYL 14FR 30C (TUBING) ×3 IMPLANT
TUBE FEEDING 8FR 16IN STR KANG (MISCELLANEOUS) ×3 IMPLANT
TUBING CONNECTING 10 (TUBING) ×10 IMPLANT
TUBING STONE CATCHER TRILOGY (MISCELLANEOUS) IMPLANT
TUBING UROLOGY SET (TUBING) ×3 IMPLANT
WATER STERILE IRR 1000ML POUR (IV SOLUTION) ×3 IMPLANT
WATER STERILE IRR 3000ML UROMA (IV SOLUTION) ×3 IMPLANT

## 2020-01-26 NOTE — Plan of Care (Signed)
  Problem: Education: Goal: Knowledge of General Education information will improve Description: Including pain rating scale, medication(s)/side effects and non-pharmacologic comfort measures 01/26/2020 2144 by Carney Living, RN Outcome: Progressing 01/26/2020 2144 by Carney Living, RN Outcome: Progressing   Problem: Health Behavior/Discharge Planning: Goal: Ability to manage health-related needs will improve 01/26/2020 2144 by Carney Living, RN Outcome: Progressing 01/26/2020 2144 by Carney Living, RN Outcome: Progressing   Problem: Clinical Measurements: Goal: Ability to maintain clinical measurements within normal limits will improve 01/26/2020 2144 by Carney Living, RN Outcome: Progressing 01/26/2020 2144 by Carney Living, RN Outcome: Progressing Goal: Will remain free from infection 01/26/2020 2144 by Carney Living, RN Outcome: Progressing 01/26/2020 2144 by Carney Living, RN Outcome: Progressing Goal: Diagnostic test results will improve 01/26/2020 2144 by Carney Living, RN Outcome: Progressing 01/26/2020 2144 by Carney Living, RN Outcome: Progressing   Problem: Activity: Goal: Risk for activity intolerance will decrease 01/26/2020 2144 by Carney Living, RN Outcome: Progressing 01/26/2020 2144 by Carney Living, RN Outcome: Progressing   Problem: Nutrition: Goal: Adequate nutrition will be maintained 01/26/2020 2144 by Carney Living, RN Outcome: Progressing 01/26/2020 2144 by Carney Living, RN Outcome: Progressing   Problem: Pain Managment: Goal: General experience of comfort will improve 01/26/2020 2144 by Carney Living, RN Outcome: Progressing 01/26/2020 2144 by Carney Living, RN Outcome: Progressing   Problem: Safety: Goal: Ability to remain free from injury will improve 01/26/2020 2144 by Carney Living, RN Outcome: Progressing 01/26/2020 2144 by Carney Living, RN Outcome: Progressing   Problem: Skin  Integrity: Goal: Risk for impaired skin integrity will decrease 01/26/2020 2144 by Carney Living, RN Outcome: Progressing 01/26/2020 2144 by Carney Living, RN Outcome: Progressing

## 2020-01-26 NOTE — Op Note (Signed)
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  PROCEDURES: 1.  Cystoscopy, bilateral retrograde pyelograms and exchange of left ureteral stent. 2.  Left percutaneous nephrostolithotomy, stone less than 2 cm. 3.  Dilation of left percutaneous tract. 4.  Left nephrostomy tube placement.  ESTIMATED BLOOD LOSS:  150 mL.  FINDINGS: 1.  Successful exchange of left ureteral stent for externalized open-ended catheter. 2.  Successful percutaneous access to lower pole kidney, dilated lower pole area. 3.  Complete resolution of all accessible stone fragments following percutaneous nephrostolithotomy, stone less than 2 cm. 4.  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  True lumen not identified.   5.  Successful placement of left nephrostomy tube, 10-French.  INDICATIONS:  The patient is a pleasant 86 year old man with history of urolithiasis.  He was found on workup of intermittent left flank pain to have a very large left lower pole stone with focal lower pole hydronephrosis concerning for likely  obstructing infundibular stone.  He originally underwent attempt at ureteroscopy for this several months ago; however, he was found to have noncommunication of the local area to his collecting system, likely felt to represent severe infundibular stenosis  versus severe impaction from stone and/or angulation.  Options were discussed including observation versus attempted percutaneous nephrostolithotomy to remove stone and hopefully restore continuity of the obstructed area and he wished to proceed.  Informed  consent was then  obtained and placed in the medical record.  DESCRIPTION OF PROCEDURE:  The patient being identified, the procedure being left percutaneous nephrostolithotomy was confirmed.  Procedure timeout was performed.  IV antibiotics were administered.  General anesthesia induced.  The patient was placed  into a low lithotomy position. Sterile field was created, prepping and draping his penis, perineum and proximal thighs using iodine.  Cystourethroscopy was performed using a 21-French rigid cystoscope with offset lens.  Inspection of anterior and  posterior urethra was unremarkable.  Inspection of bladder revealed distal end of a Polaris-type stent.  It was grasped, brought to the level of the urethral meatus.  There was minimal encrustation.  A 0.03 ZIPwire was advanced to lower pole and the  stent was exchanged for a 6-French open-ended catheter and left retrograde pyelogram was obtained.  Left retrograde pyelogram demonstrated a single left ureter and a single system left kidney.  No filling defects or narrowing noted.  The area of stone was clearly identified.  On retrograde pyelogram, it was unclear if area communicated to the  collecting system .  Foley catheter was placed free to straight drain, 10 mL sterile water in the balloon and the externalized stent was fashioned to this and connected with the contrast.  The patient was then repositioned into a prone position with  plane 10 degrees of table flexion to further develop the space between his 12th rib and iliac crest.  Chest rolls were employed.  This was prone view.  Padding of his elbows and knees.  Sequential compression devices were verified.  The patient's left  flank was then prepped and draped using chlorhexidine gluconate and toweled off with half sheet placed.  Using fluoroscopy with retrograde filling, the area of stone was then again identified.  Next, using a bull's eye technique at 15 degrees off of  horizontal, the dilated lower pole calix  area was cannulated as verified by efflux of urine.  A 0.03 ZIPwire was advanced and coiled into this area.  KMP catheter was placed.  Multiple angulations were attempted to try to obtain continuity and down the  ureter.  However, this was not possible.  It was likely felt to represent impaction of stone versus  and/or infundibular stenosis at this point.  As such, the ZIPwire was exchanged for a Super Stiff wire, which was coiled in this area.  A  percutaneous drape was applied.  Incision was made to the level of the skin approximately 1 cm and dilated at the external fascia using a fluoroscopically-guided forceps and the NephroMax balloon dilation catheter was advanced across this, inflated to a  pressure of 20 atmospheres, held for 90 seconds and the sheath was carefully advanced.  Rigid nephroscopy was then performed.  This revealed excellent placement of the sheath into the dilated lower pole area.  Immediately, there was a significant volume  of stone noted in direct apposition to the sheath and excellent hemostasis was quite favorable.  The stone was much too large for simple basketing.  As such, lithoclast dual ultrasound pneumatic energy was applied to the stone, which was then completely  fragmented, the fragments being suctioned through the lithoclast device.  With rigid nephroscopy, no true lumen was easily identified between this area and the left collecting system and it was felt that the stone had not been  obstructing of this  area.  Flexible nephroscopy was then performed using a 16-French flexible cystoscope.  Using multiple angulations, no viable lumen was noted.  Retrograde filling again of the kidney showed no obvious contrast communicating to the area.  This was likely  felt to represent severe infundibular stenosis.  Using to dual planar fluoroscopy, 2 attempts were made to perform intrarenal endopyelotomy via backside Super Stiff wire into the area of the renal pelvis.  However, this  was not successful.  As it was  felt that the most prudent means of management would be placement of a small bore nephrostomy tube and allow this space to close, further management depending on nephrostomy tube output.  As such, a new 10-French nephrostomy tube was placed into the  dilated lower pole area, connected to  straight drain and U stitch of silk was placed at the level of skin, resulting in excellent hemostasis.   straight drain.  A percutaneous dressing was applied and the procedure was terminated.  The patient tolerated the  procedure well.  No immediate perioperative complications.  The patient was taken to postanesthesia care unit in stable condition.  Plan for overnight observation.  VN/NUANCE  D:01/26/2020 T:01/26/2020 JOB:[id number]

## 2020-01-26 NOTE — Anesthesia Preprocedure Evaluation (Signed)
Anesthesia Evaluation  Patient identified by MRN, date of birth, ID band Patient awake    Reviewed: Allergy & Precautions, NPO status , Patient's Chart, lab work & pertinent test results, reviewed documented beta blocker date and time   Airway Mallampati: II  TM Distance: >3 FB Neck ROM: Full    Dental  (+) Dental Advisory Given   Pulmonary former smoker,    breath sounds clear to auscultation       Cardiovascular hypertension, Pt. on medications and Pt. on home beta blockers + dysrhythmias  Rhythm:Regular Rate:Normal     Neuro/Psych negative neurological ROS     GI/Hepatic negative GI ROS, Neg liver ROS,   Endo/Other  negative endocrine ROS  Renal/GU Renal disease     Musculoskeletal   Abdominal   Peds  Hematology negative hematology ROS (+)   Anesthesia Other Findings   Reproductive/Obstetrics                            Anesthesia Physical Anesthesia Plan  ASA: III  Anesthesia Plan: General   Post-op Pain Management:    Induction: Intravenous  PONV Risk Score and Plan: 2 and Dexamethasone, Ondansetron and Treatment may vary due to age or medical condition  Airway Management Planned: Oral ETT  Additional Equipment: None  Intra-op Plan:   Post-operative Plan: Extubation in OR  Informed Consent: I have reviewed the patients History and Physical, chart, labs and discussed the procedure including the risks, benefits and alternatives for the proposed anesthesia with the patient or authorized representative who has indicated his/her understanding and acceptance.     Dental advisory given  Plan Discussed with: CRNA  Anesthesia Plan Comments:         Anesthesia Quick Evaluation

## 2020-01-26 NOTE — Transfer of Care (Signed)
Immediate Anesthesia Transfer of Care Note  Patient: Mitchell Chandler  Procedure(s) Performed: NEPHROLITHOTOMY PERCUTANEOUS (Left Flank) CYSTOSCOPY WITH RETROGRADE PYELOGRAM/URETERAL STENT PLACEMENT (Left Urethra)  Patient Location: PACU  Anesthesia Type:General  Level of Consciousness: awake, alert , oriented and patient cooperative  Airway & Oxygen Therapy: Patient Spontanous Breathing and Patient connected to face mask oxygen  Post-op Assessment: Report given to RN, Post -op Vital signs reviewed and stable and Patient moving all extremities  Post vital signs: Reviewed and stable  Last Vitals:  Vitals Value Taken Time  BP 140/71 01/26/20 1503  Temp    Pulse 59 01/26/20 1510  Resp 14 01/26/20 1509  SpO2 100 % 01/26/20 1510  Vitals shown include unvalidated device data.  Last Pain:  Vitals:   01/26/20 1022  TempSrc:   PainSc: 0-No pain         Complications: No apparent anesthesia complications

## 2020-01-26 NOTE — Brief Op Note (Signed)
01/26/2020  2:47 PM  PATIENT:  Mitchell Chandler  86 y.o. male  PRE-OPERATIVE DIAGNOSIS:  LEFT LARGE RENAL STONE  POST-OPERATIVE DIAGNOSIS:  LEFT LARGE RENAL STONE, Severe Infundibular Stenosis  PROCEDURE:  Procedure(s) with comments: NEPHROLITHOTOMY PERCUTANEOUS (Left) - 3 HRS CYSTOSCOPY WITH RETROGRADE PYELOGRAM/URETERAL STENT PLACEMENT (Left)  SURGEON:  Surgeon(s) and Role:    Alexis Frock, MD - Primary  PHYSICIAN ASSISTANT:   ASSISTANTS: none   ANESTHESIA:   general  EBL:  20 mL   BLOOD ADMINISTERED:none  DRAINS: 1 - left nephrostomy to gravity; 2 - foley to gravity   LOCAL MEDICATIONS USED:  NONE  SPECIMEN:  Source of Specimen:  left renal stone fragments  DISPOSITION OF SPECIMEN:  [facility] for compositional analysis  COUNTS:  YES  TOURNIQUET:  * No tourniquets in log *  DICTATION: .Other Dictation: Dictation Number  [id number]  PLAN OF CARE: Admit for overnight observation  PATIENT DISPOSITION:  PACU - hemodynamically stable.   Delay start of Pharmacological VTE agent (>24hrs) due to surgical blood loss or risk of bleeding: yes

## 2020-01-26 NOTE — Anesthesia Procedure Notes (Signed)
Procedure Name: Intubation Date/Time: 01/26/2020 12:58 PM Performed by: Garrel Ridgel, CRNA Pre-anesthesia Checklist: Patient identified, Emergency Drugs available, Suction available and Patient being monitored Patient Re-evaluated:Patient Re-evaluated prior to induction Oxygen Delivery Method: Circle system utilized Preoxygenation: Pre-oxygenation with 100% oxygen Induction Type: IV induction Ventilation: Mask ventilation without difficulty Laryngoscope Size: Mac and 3 Grade View: Grade I Tube type: Oral Laser Tube: Cuffed inflated with minimal occlusive pressure - saline Tube size: 7.0 mm Number of attempts: 1 Airway Equipment and Method: Stylet and Oral airway Placement Confirmation: ETT inserted through vocal cords under direct vision,  positive ETCO2 and breath sounds checked- equal and bilateral Tube secured with: Tape Dental Injury: Teeth and Oropharynx as per pre-operative assessment

## 2020-01-26 NOTE — H&P (Signed)
Mitchell Chandler Mitchell Chandler is an 86 y.o. male.    Chief Complaint: Pre-op LEFT PCNL  HPI:   1 - Small Right Renal Mass - RUP 2cm mass incidetnal on CT 2020 non-con. Attempt MRI but motion artifact prevented enhancmenet eval. Lesion tissue density. 1 artery / 1 vein right renovascular anatomy.   2 - Recurrent Urolithiasis -  Pre 2020 MET x 1 in 1970s  10/2019 - [facility] 1.4cm infundibular stone and punctate renal stone with lower pole focal hydro. 1000HU. Unable to access / visualize ureteroscopically 11/2019.   3 - Lower Urinary Tract Symptoms / Very Large Prostate - on doxazosin x years or obstructinve symptoms with good control Prostate vol 164m by CT ellipsoid calculation 2020.   PMH sig for mild CHF/Lasix, HTN, CAD/CABG (not limitin in years). 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 He still dirives and lives with wife. His son in law is very involved.   Today "Mitchell Chandler" is seen to proceed with LEFT PCNL for obstructing infundibular stone not amenable to ureteroscopic approach. No interval fevers.     Past Medical History:  Diagnosis Date  . [place]   [facility] . History of kidney stones   . Hypertension   . Kidney stones     Past Surgical History:  Procedure Laterality Date  . COLONOSCOPY  10/02/2011   Procedure: COLONOSCOPY;  Surgeon: NRogene Houston MD;  [facility];  Service: Endoscopy;  Laterality: N/A;  10:30 am  . COLONOSCOPY N/A 12/21/2014   Procedure: COLONOSCOPY;  Surgeon: NRogene Houston MD;  [facility];  Service: Endoscopy;  Laterality: N/A;  1030  . CORONARY ARTERY BYPASS GRAFT  1995  . CYSTOSCOPY WITH RETROGRADE PYELOGRAM, URETEROSCOPY AND STENT PLACEMENT Left 11/19/2019   Procedure: CYSTOSCOPY WITH RETROGRADE PYELOGRAM, DIAGNOSTIC URETEROSCOPY AND STENT PLACEMENT;  Surgeon: MAlexis Frock MD;  [facility]   Service: Urology;  Laterality: Left;  1  . EYE SURGERY     feb 7 feb 28 [place] . KIDNEY STONE SURGERY    . PROSTATE BIOPSY      Chandler History  Problem Relation Age of Onset  . Heart disease Mother   . Hypertension Father   . Hypertension Brother   . Hypertension Brother   . Hypertension Brother   . Hypertension Brother    Social History:  reports that he quit smoking about 57 years ago. He has never used smokeless tobacco. He reports that he does not drink alcohol or use drugs.  Allergies: No Known Allergies  No medications prior to admission.    No results found for this or any previous visit (from the past 48 hour(s)). No results found.  Review of Systems  Constitutional: Negative for chills and fever.  All other systems reviewed and are negative.   There were no vitals taken for this visit. Physical Exam  Constitutional: He appears well-developed.  HENT:  Head: Normocephalic.  Eyes: Pupils are equal, round, and reactive to light.  Cardiovascular: Normal rate.  Respiratory: Effort normal.  GI: Soft.  Genitourinary:    Genitourinary Comments: Mild left CVAT at present.    Musculoskeletal:     Cervical back: Normal range of motion.  Neurological: He is alert.  Skin: Skin is warm.  Psychiatric: He has a normal mood and affect.     Assessment/Plan  Proceed as planned  with LEFT PCNL for lower pole infundibular stone. Risks, benefits, alternatives, expected peri-op course discussed previously and reiterated today.   Mitchell Frock, MD 01/26/2020, 8:39 AM

## 2020-01-27 DIAGNOSIS — N132 Hydronephrosis with renal and ureteral calculous obstruction: Secondary | ICD-10-CM | POA: Diagnosis not present

## 2020-01-27 LAB — BASIC METABOLIC PANEL
Anion gap: 11 (ref 5–15)
BUN: 20 mg/dL (ref 8–23)
CO2: 19 mmol/L — ABNORMAL LOW (ref 22–32)
Calcium: 8.6 mg/dL — ABNORMAL LOW (ref 8.9–10.3)
Chloride: 109 mmol/L (ref 98–111)
Creatinine, Ser: 1.2 mg/dL (ref 0.61–1.24)
GFR calc Af Amer: >60 mL/min (ref >60)
GFR calc non Af Amer: 55 mL/min — ABNORMAL LOW (ref >60)
Glucose, Bld: 126 mg/dL — ABNORMAL HIGH (ref 70–99)
Potassium: 4.5 mmol/L (ref 3.5–5.1)
Sodium: 139 mmol/L (ref 135–145)

## 2020-01-27 LAB — HEMOGLOBIN AND HEMATOCRIT, BLOOD
HCT: 39.3 % (ref 39.0–52.0)
Hemoglobin: 13 g/dL (ref 13.0–17.0)

## 2020-01-27 MED ORDER — TRAMADOL HCL 50 MG PO TABS: 50.0000 mg | ORAL_TABLET | Freq: Four times a day (QID) | ORAL | 0 refills | Status: DC | PRN

## 2020-01-27 NOTE — Plan of Care (Signed)
Discharge instructions reviewed with patient and son in law.  Questions answered, verbalized understanding.  Patient transported to main entrance via wheelchair to be taken home by son in law.

## 2020-01-27 NOTE — Anesthesia Postprocedure Evaluation (Signed)
Anesthesia Post Note  Patient: Mitchell Chandler  Procedure(s) Performed: NEPHROLITHOTOMY PERCUTANEOUS (Left Flank) CYSTOSCOPY WITH RETROGRADE PYELOGRAM/URETERAL STENT PLACEMENT (Left Urethra)     Patient location during evaluation: PACU Anesthesia Type: General Level of consciousness: awake and alert Pain management: pain level controlled Vital Signs Assessment: post-procedure vital signs reviewed and stable Respiratory status: spontaneous breathing, nonlabored ventilation, respiratory function stable and patient connected to nasal cannula oxygen Cardiovascular status: blood pressure returned to baseline and stable Postop Assessment: no apparent nausea or vomiting Anesthetic complications: no    Last Vitals:  Vitals:   01/27/20 0424 01/27/20 1121  BP: 118/65 125/65  Pulse: 72 83  Resp: 18   Temp: 36.7 C   SpO2: 97% 97%    Last Pain:  Vitals:   01/27/20 0730  TempSrc:   PainSc: 0-No pain                 Tiajuana Amass

## 2020-01-27 NOTE — Discharge Summary (Signed)
Physician Discharge Summary  Patient ID: Mitchell Chandler MRN: [id number] DOB/AGE: [birth year] 84 y.o.  Admit date: 01/26/2020 Discharge date: 01/27/2020  Admission Diagnoses: large LEFT Renal Stone  Discharge Diagnoses:  Active Problems:   Renal stone   Discharged Condition: good  Hospital Course: Pt underwent LEFT percutanous nephrostolithosomy on 01/26/20, the day of admission, for large left renal stone in walled off obstructed lower pole calyx. POD 1 Hgb 13 and Cr 1.2. Foley DC'd AM POD 1 and pt voided. Left neph tube removed few hours later as output remained scant. By noon of POD 1, he is ambulatory, pain controlled on PO meds, tollerating PO nutrition, and felt to be adequate for discharge.   Consults: None  Significant Diagnostic Studies: labs: as per above  Treatments: surgery: as per above  Discharge Exam: Blood pressure 118/65, pulse 72, temperature 98.1 F (36.7 C), temperature source Oral, resp. rate 18, height 5\' 8"  (1.727 m), weight 84.8 kg, SpO2 97 %. General appearance: alert, cooperative and at baseline. AOx3 Eyes: negative Nose: Nares normal. Septum midline. Mucosa normal. No drainage or sinus tenderness. Throat: lips, mucosa, and tongue normal; teeth and gums normal Neck: supple, symmetrical, trachea midline Back: symmetric, no curvature. ROM normal. No CVA tenderness. Resp: non-labored on room air.  Cardio: regular rate and rhythm, S1, S2 normal, no murmur, click, rub or gallop GI: soft, non-tender; bowel sounds normal; no masses,  no organomegaly Male genitalia: normal Extremities: extremities normal, atraumatic, no cyanosis or edema Pulses: 2+ and symmetric Skin: Skin color, texture, turgor normal. No rashes or lesions Lymph nodes: Cervical, supraclavicular, and axillary nodes normal. Incision/Wound: left neph tube with scant output. Remeoved, inspected and intact, dry dressing replaced. Silk purse-string suture remains.   Disposition:        Signed: Alexis Frock 01/27/2020, 11:13 AM

## 2020-01-27 NOTE — Discharge Instructions (Signed)
1 - You may have urinary urgency (bladder spasms) and bloody urine on / off x few days as well as some small drainage from left back site. This is normal.  2 - Back dressing can come off tomorrow, no restrictions on bathing / showering or activity.   3 - Call MD or go to ER for fever >102, severe pain / nausea / vomiting not relieved by medications, or acute change in medical status

## 2020-01-28 ENCOUNTER — Encounter: Payer: Self-pay | Admitting: *Deleted

## 2020-02-17 ENCOUNTER — Encounter: Payer: Self-pay | Admitting: Family Medicine

## 2020-06-07 ENCOUNTER — Ambulatory Visit (INDEPENDENT_AMBULATORY_CARE_PROVIDER_SITE_OTHER): Payer: Medicare Other | Admitting: Family Medicine

## 2020-06-07 ENCOUNTER — Other Ambulatory Visit: Payer: Self-pay

## 2020-06-07 ENCOUNTER — Encounter: Payer: Self-pay | Admitting: Family Medicine

## 2020-06-07 VITALS — BP 143/64 | HR 60 | Temp 98.0°F | Ht 68.0 in | Wt 182.8 lb

## 2020-06-07 DIAGNOSIS — I10 Essential (primary) hypertension: Secondary | ICD-10-CM

## 2020-06-07 DIAGNOSIS — R350 Frequency of micturition: Secondary | ICD-10-CM

## 2020-06-07 DIAGNOSIS — E785 Hyperlipidemia, unspecified: Secondary | ICD-10-CM | POA: Diagnosis not present

## 2020-06-07 DIAGNOSIS — N401 Enlarged prostate with lower urinary tract symptoms: Secondary | ICD-10-CM

## 2020-06-07 DIAGNOSIS — M1990 Unspecified osteoarthritis, unspecified site: Secondary | ICD-10-CM | POA: Diagnosis not present

## 2020-06-07 MED ORDER — SIMVASTATIN 20 MG PO TABS: 20.0000 mg | ORAL_TABLET | Freq: Every day | ORAL | 1 refills | Status: DC

## 2020-06-07 MED ORDER — FOLIC ACID-VIT B6-VIT B12 2.5-25-1 MG PO TABS: 1.0000 | ORAL_TABLET | Freq: Every day | ORAL | 1 refills | Status: DC

## 2020-06-07 MED ORDER — AMLODIPINE BESY-BENAZEPRIL HCL 10-20 MG PO CAPS: 1.0000 | ORAL_CAPSULE | Freq: Every day | ORAL | 1 refills | Status: DC

## 2020-06-07 MED ORDER — DOXAZOSIN MESYLATE 8 MG PO TABS: 8.0000 mg | ORAL_TABLET | Freq: Every day | ORAL | 1 refills | Status: DC

## 2020-06-07 MED ORDER — METOPROLOL TARTRATE 50 MG PO TABS: 50.0000 mg | ORAL_TABLET | Freq: Two times a day (BID) | ORAL | 1 refills | Status: DC

## 2020-06-07 MED ORDER — FUROSEMIDE 20 MG PO TABS: 20.0000 mg | ORAL_TABLET | Freq: Every day | ORAL | 1 refills | Status: DC

## 2020-06-07 NOTE — Patient Instructions (Addendum)
Maximum acetaminophen (Tylenol) is 1,000 mg (2 tablets) three times a day.    Preventive Care 22 Years and Older, Male Preventive care refers to lifestyle choices and visits with your health care provider that can promote health and wellness. This includes:  A yearly physical exam. This is also called an annual well check.  Regular dental and eye exams.  Immunizations.  Screening for certain conditions.  Healthy lifestyle choices, such as diet and exercise. What can I expect for my preventive care visit? Physical exam Your health care provider will check:  Height and weight. These may be used to calculate body mass index (BMI), which is a measurement that tells if you are at a healthy weight.  Heart rate and blood pressure.  Your skin for abnormal spots. Counseling Your health care provider may ask you questions about:  Alcohol, tobacco, and drug use.  Emotional well-being.  Home and relationship well-being.  Sexual activity.  Eating habits.  History of falls.  Memory and ability to understand (cognition).  Work and work Statistician. What immunizations do I need?  Influenza (flu) vaccine  This is recommended every year. Tetanus, diphtheria, and pertussis (Tdap) vaccine  You may need a Td booster every 10 years. Varicella (chickenpox) vaccine  You may need this vaccine if you have not already been vaccinated. Zoster (shingles) vaccine  You may need this after age 80. Pneumococcal conjugate (PCV13) vaccine  One dose is recommended after age 73. Pneumococcal polysaccharide (PPSV23) vaccine  One dose is recommended after age 58. Measles, mumps, and rubella (MMR) vaccine  You may need at least one dose of MMR if you were born in 1957 or later. You may also need a second dose. Meningococcal conjugate (MenACWY) vaccine  You may need this if you have certain conditions. Hepatitis A vaccine  You may need this if you have certain conditions or if you travel  or work in places where you may be exposed to hepatitis A. Hepatitis B vaccine  You may need this if you have certain conditions or if you travel or work in places where you may be exposed to hepatitis B. Haemophilus influenzae type b (Hib) vaccine  You may need this if you have certain conditions. You may receive vaccines as individual doses or as more than one vaccine together in one shot (combination vaccines). Talk with your health care provider about the risks and benefits of combination vaccines. What tests do I need? Blood tests  Lipid and cholesterol levels. These may be checked every 5 years, or more frequently depending on your overall health.  Hepatitis C test.  Hepatitis B test. Screening  Lung cancer screening. You may have this screening every year starting at age 48 if you have a 30-pack-year history of smoking and currently smoke or have quit within the past 15 years.  Colorectal cancer screening. All adults should have this screening starting at age 44 and continuing until age 19. Your health care provider may recommend screening at age 53 if you are at increased risk. You will have tests every 1-10 years, depending on your results and the type of screening test.  Prostate cancer screening. Recommendations will vary depending on your family history and other risks.  Diabetes screening. This is done by checking your blood sugar (glucose) after you have not eaten for a while (fasting). You may have this done every 1-3 years.  Abdominal aortic aneurysm (AAA) screening. You may need this if you are a current or former smoker.  Sexually transmitted disease (STD) testing. Follow these instructions at home: Eating and drinking  Eat a diet that includes fresh fruits and vegetables, whole grains, lean protein, and low-fat dairy products. Limit your intake of foods with high amounts of sugar, saturated fats, and salt.  Take vitamin and mineral supplements as recommended by  your health care provider.  Do not drink alcohol if your health care provider tells you not to drink.  If you drink alcohol: ? Limit how much you have to 0-2 drinks a day. ? Be aware of how much alcohol is in your drink. In the [place] one drink equals one 12 oz bottle of beer (355 mL), one 5 oz glass of wine (148 mL), or one 1 oz glass of hard liquor (44 mL). Lifestyle  Take daily care of your teeth and gums.  Stay active. Exercise for at least 30 minutes on 5 or more days each week.  Do not use any products that contain nicotine or tobacco, such as cigarettes, e-cigarettes, and chewing tobacco. If you need help quitting, ask your health care provider.  If you are sexually active, practice safe sex. Use a condom or other form of protection to prevent STIs (sexually transmitted infections).  Talk with your health care provider about taking a low-dose aspirin or statin. What's next?  Visit your health care provider once a year for a well check visit.  Ask your health care provider how often you should have your eyes and teeth checked.  Stay up to date on all vaccines. This information is not intended to replace advice given to you by your health care provider. Make sure you discuss any questions you have with your health care provider. Document Revised: 11/12/2018 Document Reviewed: 11/12/2018 Elsevier Patient Education  2020 [employer].

## 2020-06-07 NOTE — Progress Notes (Signed)
Assessment & Plan:  1. Essential (primary) hypertension - Well controlled on current regimen.  - amLODipine-benazepril (LOTREL) 10-20 MG capsule; Take 1 capsule by mouth daily at 12 noon.  Dispense: 90 capsule; Refill: 1 - furosemide (LASIX) 20 MG tablet; Take 1 tablet (20 mg total) by mouth daily.  Dispense: 90 tablet; Refill: 1 - metoprolol tartrate (LOPRESSOR) 50 MG tablet; Take 1 tablet (50 mg total) by mouth 2 (two) times daily.  Dispense: 180 tablet; Refill: 1 - CBC with Differential/Platelet - CMP14+EGFR - Lipid panel  2. Dyslipidemia - Well controlled on current regimen.  - simvastatin (ZOCOR) 20 MG tablet; Take 1 tablet (20 mg total) by mouth at bedtime.  Dispense: 90 tablet; Refill: 1 - CBC with Differential/Platelet - CMP14+EGFR - Lipid panel  3. Benign prostatic hyperplasia with urinary frequency - Well controlled on current regimen.  - doxazosin (CARDURA) 8 MG tablet; Take 1 tablet (8 mg total) by mouth daily with supper.  Dispense: 90 tablet; Refill: 1 - PSA, total and free  4. Arthritis - Encouraged Tylenol 1,000 mg BID and advised he can take TID PRN.    Return in about 6 months (around 12/08/2020) for follow-up of chronic medication conditions.  [name], MSN, APRN, FNP-C [facility]  Subjective:    Patient ID: Mitchell Chandler, male    DOB: 04/04/1932, 86 y.o.   MRN: [id number]  Patient Care Team: Loman Brooklyn, FNP as PCP - General (Family Medicine) Leroy Sea, MD as Referring Physician (Urology)   Chief Complaint:  Chief Complaint  Patient presents with  . Hypertension    6 month follow up of chronic medical conditions  . Fatigue    Patient states the last few months he has had on and off bilateral leg and hip weakness.    HPI: Mitchell Chandler is a 85 y.o. male presenting on 06/07/2020 for Hypertension (6 month follow up of chronic medical conditions) and Fatigue (Patient states the last few months he has had on and off  bilateral leg and hip weakness.)  Patient has had kidney stones removed and is no longer in any pain from that.  Taking amlodipine-benazepril, furosemide, and metoprolol for hypertension.  He takes Cardura for BPH.  Takes simvastatin for hyperlipidemia.  New complaints: Patient reports bilateral trouble with his legs and hips which he describes as feeling like he has rocks in his pockets.  States that when he first gets up with a little hard to get going but then as the day goes on he gets better.  He was taking Tylenol 1000 mg twice daily and decreased to 500 mg twice daily.  He feels the symptoms he is describing are worse when he forgets to take the Tylenol.   Social history:  Relevant past medical, surgical, family and social history reviewed and updated as indicated. Interim medical history since our last visit reviewed.  Allergies and medications reviewed and updated.  DATA REVIEWED: CHART IN EPIC  ROS: Negative unless specifically indicated above in HPI.    Current Outpatient Medications:  .  acetaminophen (TYLENOL) 500 MG tablet, Take 500-1,000 mg by mouth every 6 (six) hours as needed (for arthritis pain (neck/shoulder)). , Disp: , Rfl:  .  amLODipine-benazepril (LOTREL) 10-20 MG capsule, Take 1 capsule by mouth daily. (Patient taking differently: Take 1 capsule by mouth daily at 12 noon. ), Disp: 90 capsule, Rfl: 1 .  aspirin 81 MG EC tablet, Take 81 mg by mouth daily. Swallow whole.,  Disp: , Rfl:  .  doxazosin (CARDURA) 8 MG tablet, Take 8 mg by mouth daily with supper. , Disp: , Rfl:  .  Folic Acid-Vit H4-TML Y65 (FOLBEE) 2.5-25-1 MG TABS tablet, Take 1 tablet by mouth daily. (Patient taking differently: Take 1 tablet by mouth daily at 12 noon. ), Disp: 90 tablet, Rfl: 1 .  furosemide (LASIX) 20 MG tablet, Take 1 tablet (20 mg total) by mouth daily., Disp: 90 tablet, Rfl: 1 .  metoprolol tartrate (LOPRESSOR) 50 MG tablet, Take 2 tablets (100 mg total) by mouth daily.  (Patient taking differently: Take 50 mg by mouth 2 (two) times daily. ), Disp: 180 tablet, Rfl: 1 .  simvastatin (ZOCOR) 20 MG tablet, Take 1 tablet (20 mg total) by mouth at bedtime., Disp: 90 tablet, Rfl: 1   No Known Allergies Past Medical History:  Diagnosis Date  . Benign prostatic hyperplasia 12/25/2016  . 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 Redmond Pulling  . History of kidney stones   . Hypertension   . 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 Yearly surviellance.   . Renal stone 01/26/2020    Past Surgical History:  Procedure Laterality Date  . COLONOSCOPY  10/02/2011   Procedure: COLONOSCOPY;  Surgeon: Rogene Houston, MD;  [facility];  Service: Endoscopy;  Laterality: N/A;  10:30 am  . COLONOSCOPY N/A 12/21/2014   Procedure: COLONOSCOPY;  Surgeon: Rogene Houston, MD;  [facility];  Service: Endoscopy;  Laterality: N/A;  1030  . CORONARY ARTERY BYPASS GRAFT  1995  . CYSTOSCOPY W/ URETERAL STENT PLACEMENT Left 01/26/2020   Procedure: CYSTOSCOPY WITH RETROGRADE PYELOGRAM/URETERAL STENT PLACEMENT;  Surgeon: Alexis Frock, MD;  [facility];  Service: Urology;  Laterality: Left;  . CYSTOSCOPY WITH RETROGRADE PYELOGRAM, URETEROSCOPY AND STENT PLACEMENT Left 11/19/2019   Procedure: CYSTOSCOPY WITH RETROGRADE PYELOGRAM, DIAGNOSTIC URETEROSCOPY AND STENT PLACEMENT;  Surgeon: Alexis Frock, MD;  Location: [facility];  Service: Urology;  Laterality: Left;  1  . EYE SURGERY     feb 7 feb 28 [place]  . KIDNEY STONE SURGERY    . NEPHROLITHOTOMY Left 01/26/2020   Procedure: NEPHROLITHOTOMY PERCUTANEOUS;  Surgeon: Alexis Frock, MD;  [facility];  Service: Urology;  Laterality: Left;  3 HRS  . PROSTATE BIOPSY      Social History   Socioeconomic History  . Marital status: Married    Spouse name: Not on file  . Number of children: Not on file  .  Years of education: Not on file  . Highest education level: Not on file  Occupational History  . Not on file  Tobacco Use  . Smoking status: Former Smoker    Quit date: 12/21/1962    Years since quitting: 57.5  . Smokeless tobacco: Never Used  Substance and Sexual Activity  . Alcohol use: No  . Drug use: No  . Sexual activity: Not on file  Other Topics Concern  . Not on file  Social History Narrative  . Not on file   Social Determinants of Health   Financial Resource Strain:   . Difficulty of Paying Living Expenses:   Food Insecurity:   . Worried About Charity fundraiser in the Last Year:   . Arboriculturist in the Last Year:   Transportation Needs:   . Film/video editor (Medical):   Marland Kitchen  Lack of Transportation (Non-Medical):   Physical Activity:   . Days of Exercise per Week:   . Minutes of Exercise per Session:   Stress:   . Feeling of Stress :   Social Connections:   . Frequency of Communication with Friends and Family:   . Frequency of Social Gatherings with Friends and Family:   . Attends Religious Services:   . Active Member of Clubs or Organizations:   . Attends Archivist Meetings:   Marland Kitchen Marital Status:   Intimate Partner Violence:   . Fear of Current or Ex-Partner:   . Emotionally Abused:   Marland Kitchen Physically Abused:   . Sexually Abused:         Objective:    BP (!) 143/64   Pulse 60   Temp 98 F (36.7 C) (Temporal)   Ht '5\' 8"'$  (1.727 m)   Wt 182 lb 12.8 oz (82.9 kg)   SpO2 96%   BMI 27.79 kg/m   Wt Readings from Last 3 Encounters:  06/07/20 182 lb 12.8 oz (82.9 kg)  01/26/20 186 lb 15.2 oz (84.8 kg)  01/21/20 182 lb 4 oz (82.7 kg)    Physical Exam Vitals reviewed.  Constitutional:      General: He is not in acute distress.    Appearance: Normal appearance. He is overweight. He is not ill-appearing, toxic-appearing or diaphoretic.  HENT:     Head: Normocephalic and atraumatic.     Right Ear: Tympanic membrane, ear canal and external  ear normal. There is no impacted cerumen.     Left Ear: Tympanic membrane, ear canal and external ear normal. There is no impacted cerumen.     Nose: Nose normal. No congestion or rhinorrhea.     Mouth/Throat:     Mouth: Mucous membranes are moist.     Pharynx: Oropharynx is clear. No oropharyngeal exudate or posterior oropharyngeal erythema.  Eyes:     General: No scleral icterus.       Right eye: No discharge.        Left eye: No discharge.     Conjunctiva/sclera: Conjunctivae normal.     Pupils: Pupils are equal, round, and reactive to light.  Cardiovascular:     Rate and Rhythm: Normal rate and regular rhythm.     Heart sounds: Normal heart sounds. No murmur heard.  No friction rub. No gallop.   Pulmonary:     Effort: Pulmonary effort is normal. No respiratory distress.     Breath sounds: Normal breath sounds. No stridor. No wheezing, rhonchi or rales.  Abdominal:     General: Abdomen is flat. Bowel sounds are normal. There is no distension.     Palpations: Abdomen is soft. There is no mass.     Tenderness: There is no abdominal tenderness. There is no guarding or rebound.     Hernia: No hernia is present.  Musculoskeletal:        General: Normal range of motion.     Cervical back: Normal range of motion and neck supple. No rigidity. No muscular tenderness.     Right lower leg: No edema.     Left lower leg: No edema.  Lymphadenopathy:     Cervical: No cervical adenopathy.  Skin:    General: Skin is warm and dry.     Capillary Refill: Capillary refill takes less than 2 seconds.  Neurological:     General: No focal deficit present.     Mental Status: He is alert and oriented  to person, place, and time. Mental status is at baseline.  Psychiatric:        Mood and Affect: Mood normal.        Behavior: Behavior normal.        Thought Content: Thought content normal.        Judgment: Judgment normal.     No results found for: TSH Lab Results  Component Value Date   WBC  8.5 01/21/2020   HGB 13.0 01/27/2020   HCT 39.3 01/27/2020   MCV 95.6 01/21/2020   PLT 230 01/21/2020   Lab Results  Component Value Date   NA 139 01/27/2020   K 4.5 01/27/2020   CO2 19 (L) 01/27/2020   GLUCOSE 126 (H) 01/27/2020   BUN 20 01/27/2020   CREATININE 1.20 01/27/2020   BILITOT 0.6 06/25/2019   ALKPHOS 69 06/25/2019   AST 23 06/25/2019   ALT 17 06/25/2019   PROT 6.3 06/25/2019   ALBUMIN 4.0 06/25/2019   CALCIUM 8.6 (L) 01/27/2020   ANIONGAP 11 01/27/2020   No results found for: CHOL No results found for: HDL No results found for: LDLCALC No results found for: TRIG No results found for: CHOLHDL No results found for: HGBA1C

## 2020-06-08 LAB — LIPID PANEL
Chol/HDL Ratio: 3.1 ratio (ref 0.0–5.0)
Cholesterol, Total: 157 mg/dL (ref 100–199)
HDL: 51 mg/dL (ref >39)
LDL Chol Calc (NIH): 78 mg/dL (ref 0–99)
Triglycerides: 165 mg/dL — ABNORMAL HIGH (ref 0–149)
VLDL Cholesterol Cal: 28 mg/dL (ref 5–40)

## 2020-06-08 LAB — PSA, TOTAL AND FREE
PSA, Free Pct: 35.1 %
PSA, Free: 3.97 ng/mL
Prostate Specific Ag, Serum: 11.3 ng/mL — ABNORMAL HIGH (ref 0.0–4.0)

## 2020-06-08 LAB — CBC WITH DIFFERENTIAL/PLATELET
Basophils Absolute: 0 10*3/uL (ref 0.0–0.2)
Basos: 0 %
EOS (ABSOLUTE): 0.3 10*3/uL (ref 0.0–0.4)
Eos: 4 %
Hematocrit: 45.9 % (ref 37.5–51.0)
Hemoglobin: 15.6 g/dL (ref 13.0–17.7)
Immature Grans (Abs): 0 10*3/uL (ref 0.0–0.1)
Immature Granulocytes: 0 %
Lymphocytes Absolute: 2.5 10*3/uL (ref 0.7–3.1)
Lymphs: 32 %
MCH: 31.8 pg (ref 26.6–33.0)
MCHC: 34 g/dL (ref 31.5–35.7)
MCV: 94 fL (ref 79–97)
Monocytes Absolute: 0.5 10*3/uL (ref 0.1–0.9)
Monocytes: 6 %
Neutrophils Absolute: 4.6 10*3/uL (ref 1.4–7.0)
Neutrophils: 58 %
Platelets: 222 10*3/uL (ref 150–450)
RBC: 4.9 x10E6/uL (ref 4.14–5.80)
RDW: 13 % (ref 11.6–15.4)
WBC: 7.9 10*3/uL (ref 3.4–10.8)

## 2020-06-08 LAB — CMP14+EGFR
ALT: 13 IU/L (ref 0–44)
AST: 21 IU/L (ref 0–40)
Albumin/Globulin Ratio: 1.5 (ref 1.2–2.2)
Albumin: 4.3 g/dL (ref 3.6–4.6)
Alkaline Phosphatase: 78 IU/L (ref 48–121)
BUN/Creatinine Ratio: 17 (ref 10–24)
BUN: 20 mg/dL (ref 8–27)
Bilirubin Total: 0.4 mg/dL (ref 0.0–1.2)
CO2: 21 mmol/L (ref 20–29)
Calcium: 9.4 mg/dL (ref 8.6–10.2)
Chloride: 107 mmol/L — ABNORMAL HIGH (ref 96–106)
Creatinine, Ser: 1.2 mg/dL (ref 0.76–1.27)
GFR calc Af Amer: 63 mL/min/{1.73_m2} (ref >59)
GFR calc non Af Amer: 54 mL/min/{1.73_m2} — ABNORMAL LOW (ref >59)
Globulin, Total: 2.8 g/dL (ref 1.5–4.5)
Glucose: 96 mg/dL (ref 65–99)
Potassium: 4.9 mmol/L (ref 3.5–5.2)
Sodium: 142 mmol/L (ref 134–144)
Total Protein: 7.1 g/dL (ref 6.0–8.5)

## 2020-08-02 ENCOUNTER — Encounter (HOSPITAL_COMMUNITY): Payer: Self-pay

## 2020-08-02 ENCOUNTER — Other Ambulatory Visit: Payer: Self-pay

## 2020-08-02 ENCOUNTER — Emergency Department (HOSPITAL_COMMUNITY): Payer: Medicare Other

## 2020-08-02 ENCOUNTER — Emergency Department (HOSPITAL_COMMUNITY)
Admission: EM | Admit: 2020-08-02 | Discharge: 2020-08-02 | Disposition: A | Discharge status: Home / Self Care | Payer: Medicare Other | Attending: Emergency Medicine | Admitting: Emergency Medicine

## 2020-08-02 DIAGNOSIS — I10 Essential (primary) hypertension: Secondary | ICD-10-CM | POA: Diagnosis not present

## 2020-08-02 DIAGNOSIS — Z951 Presence of aortocoronary bypass graft: Secondary | ICD-10-CM | POA: Insufficient documentation

## 2020-08-02 DIAGNOSIS — Z87891 Personal history of nicotine dependence: Secondary | ICD-10-CM | POA: Diagnosis not present

## 2020-08-02 DIAGNOSIS — R42 Dizziness and giddiness: Secondary | ICD-10-CM | POA: Diagnosis not present

## 2020-08-02 DIAGNOSIS — R531 Weakness: Secondary | ICD-10-CM | POA: Diagnosis not present

## 2020-08-02 DIAGNOSIS — Z20822 Contact with and (suspected) exposure to covid-19: Secondary | ICD-10-CM | POA: Diagnosis not present

## 2020-08-02 LAB — HEPATIC FUNCTION PANEL
ALT: 13 U/L (ref 0–44)
AST: 21 U/L (ref 15–41)
Albumin: 4.1 g/dL (ref 3.5–5.0)
Alkaline Phosphatase: 56 U/L (ref 38–126)
Bilirubin, Direct: 0.1 mg/dL (ref 0.0–0.2)
Indirect Bilirubin: 0.7 mg/dL (ref 0.3–0.9)
Total Bilirubin: 0.8 mg/dL (ref 0.3–1.2)
Total Protein: 7.5 g/dL (ref 6.5–8.1)

## 2020-08-02 LAB — BASIC METABOLIC PANEL
Anion gap: 11 (ref 5–15)
BUN: 18 mg/dL (ref 8–23)
CO2: 23 mmol/L (ref 22–32)
Calcium: 9.4 mg/dL (ref 8.9–10.3)
Chloride: 107 mmol/L (ref 98–111)
Creatinine, Ser: 1.14 mg/dL (ref 0.61–1.24)
GFR calc Af Amer: >60 mL/min (ref >60)
GFR calc non Af Amer: 58 mL/min — ABNORMAL LOW (ref >60)
Glucose, Bld: 139 mg/dL — ABNORMAL HIGH (ref 70–99)
Potassium: 3.9 mmol/L (ref 3.5–5.1)
Sodium: 141 mmol/L (ref 135–145)

## 2020-08-02 LAB — CBC
HCT: 45.2 % (ref 39.0–52.0)
Hemoglobin: 15 g/dL (ref 13.0–17.0)
MCH: 31.8 pg (ref 26.0–34.0)
MCHC: 33.2 g/dL (ref 30.0–36.0)
MCV: 95.8 fL (ref 80.0–100.0)
Platelets: 208 10*3/uL (ref 150–400)
RBC: 4.72 MIL/uL (ref 4.22–5.81)
RDW: 12.9 % (ref 11.5–15.5)
WBC: 8.5 10*3/uL (ref 4.0–10.5)
nRBC: 0 % (ref 0.0–0.2)

## 2020-08-02 LAB — SARS CORONAVIRUS 2 BY RT PCR (HOSPITAL ORDER, PERFORMED IN ~~LOC~~ HOSPITAL LAB): SARS Coronavirus 2: NEGATIVE

## 2020-08-02 LAB — URINALYSIS, ROUTINE W REFLEX MICROSCOPIC
Bilirubin Urine: NEGATIVE
Glucose, UA: NEGATIVE mg/dL
Hgb urine dipstick: NEGATIVE
Ketones, ur: 5 mg/dL — AB
Leukocytes,Ua: NEGATIVE
Nitrite: NEGATIVE
Protein, ur: NEGATIVE mg/dL
Specific Gravity, Urine: 1.013 (ref 1.005–1.030)
pH: 5 (ref 5.0–8.0)

## 2020-08-02 LAB — TROPONIN I (HIGH SENSITIVITY)
Troponin I (High Sensitivity): 15 ng/L (ref <18)
Troponin I (High Sensitivity): 20 ng/L — ABNORMAL HIGH (ref <18)
Troponin I (High Sensitivity): 16 ng/L (ref <18)

## 2020-08-02 LAB — LIPASE, BLOOD: Lipase: 44 U/L (ref 11–51)

## 2020-08-02 LAB — CBG MONITORING, ED: Glucose-Capillary: 136 mg/dL — ABNORMAL HIGH (ref 70–99)

## 2020-08-02 IMAGING — MR MR HEAD W/O CM
10 of 11 series · 40 of 48 positions shown · non-contrast
Comparison: None.

CLINICAL DATA: Acute neuro deficit rule out stroke

EXAM:
MRI HEAD WITHOUT CONTRAST
TECHNIQUE: Multiplanar, multiecho pulse sequences of the brain and surrounding
structures were obtained without intravenous contrast.

[Series 5: DWI · axial · 4.0mm · 0.88mm/px · z∈[-38,+101]mm · 6 of 36 slices shown (1 of 4)]
[im 1/36]
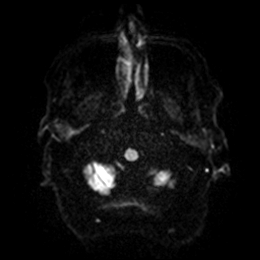
[im 8/36]
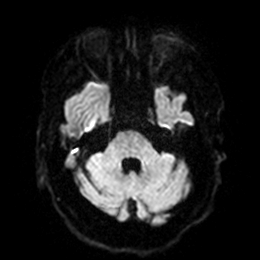
[im 15/36]
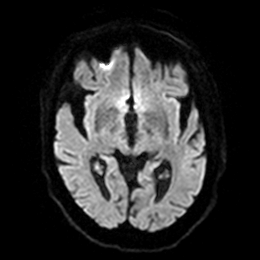
[im 22/36]
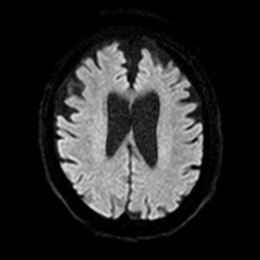
[im 29/36]
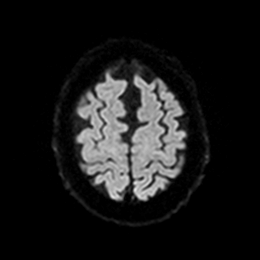
[im 36/36]
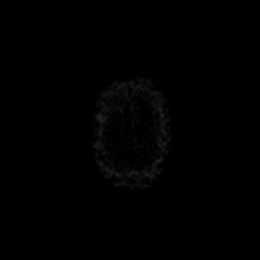

[Series 6: DWI · axial · 4.0mm · 0.88mm/px · z∈[-38,+101]mm · 5 of 36 slices shown (2 of 4)]
[im 1/36]
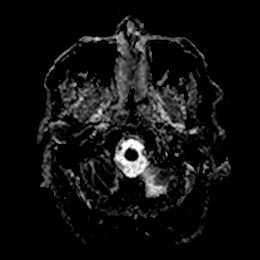
[im 9/36]
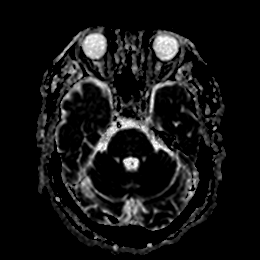
[im 18/36]
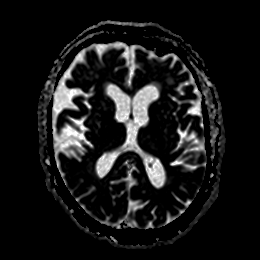
[im 27/36]
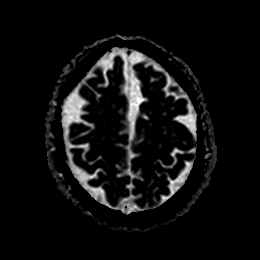
[im 36/36]
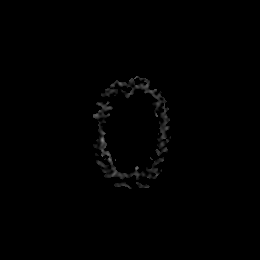

[Series 7: DWI · coronal · 4.0mm · 0.88mm/px · 4 of 32 slices shown (3 of 4)]
[im 1/32]
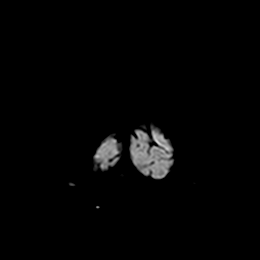
[im 11/32]
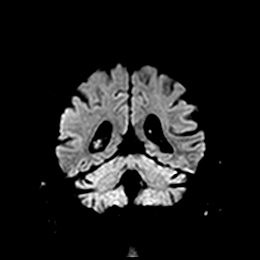
[im 21/32]
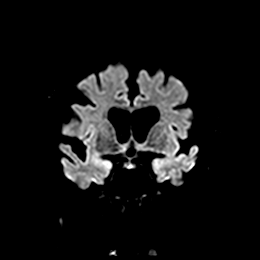
[im 32/32]
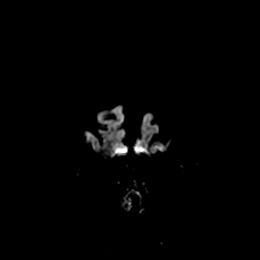

[Series 8: DWI · coronal · 4.0mm · 0.88mm/px · 4 of 32 slices shown (4 of 4)]
[im 1/32]
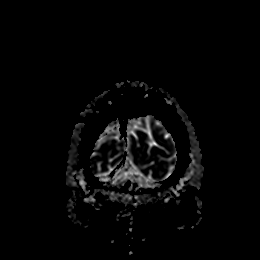
[im 11/32]
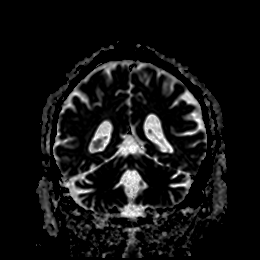
[im 21/32]
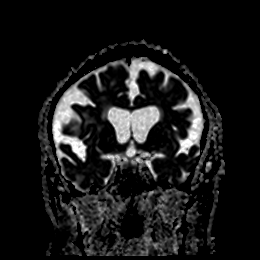
[im 32/32]
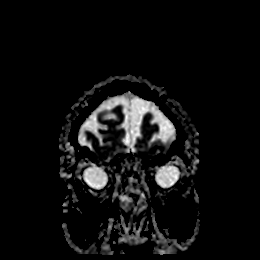

[Series 9: T1 · sagittal · 5.0mm · 0.94mm/px · 3 of 25 slices shown (1 of 2)]
[im 1/25]
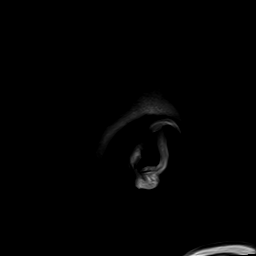
[im 13/25]
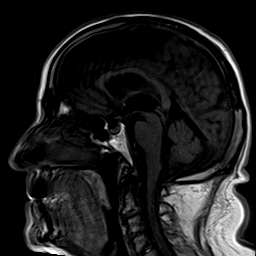
[im 25/25]
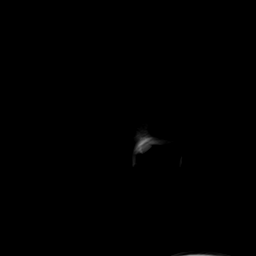

[Series 10: T2 · axial · 5.0mm · 0.72mm/px · z∈[-45,+108]mm · 3 of 23 slices shown (1 of 2)]
[im 1/23]
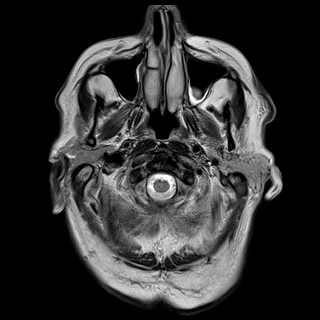
[im 12/23]
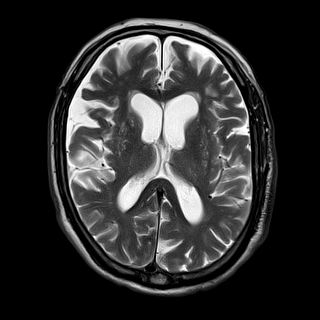
[im 23/23]
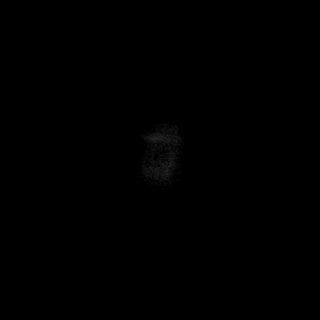

[Series 11: ax hemo · axial · 5.0mm · 0.86mm/px · z∈[-42,+101]mm · 3 of 25 slices shown]
[im 1/25]
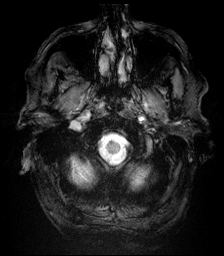
[im 13/25]
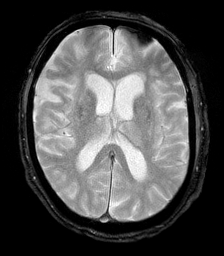
[im 25/25]
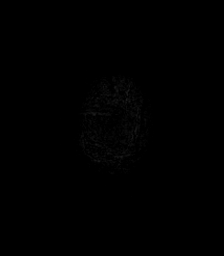

[Series 12: FLAIR · axial · 4.0mm · 0.43mm/px · z∈[-44,+103]mm · 5 of 38 slices shown]
[im 1/38]
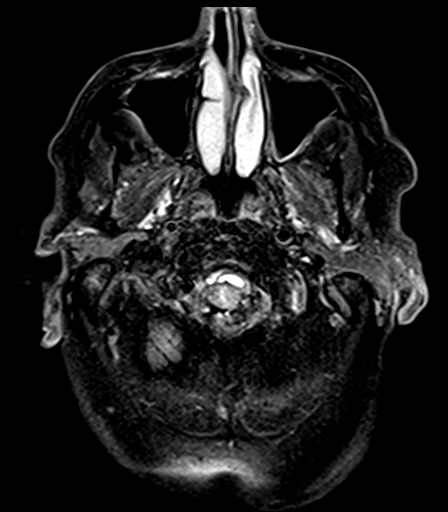
[im 10/38]
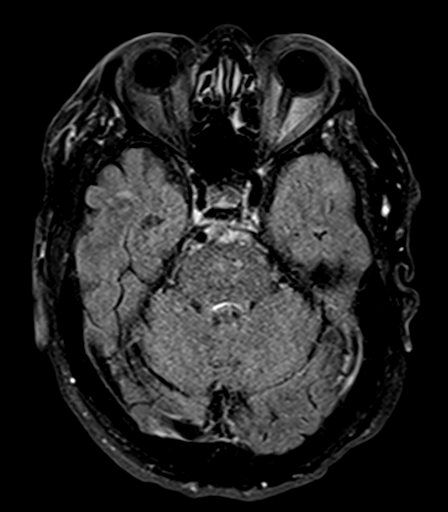
[im 19/38]
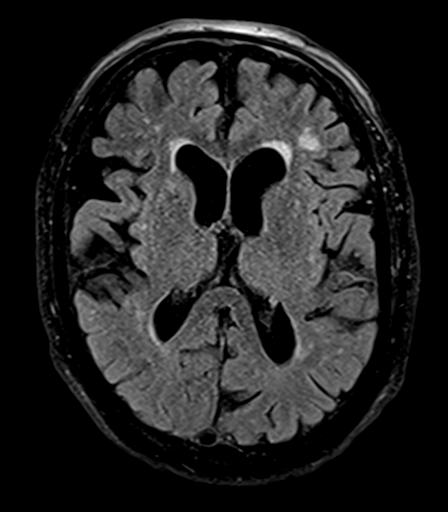
[im 28/38]
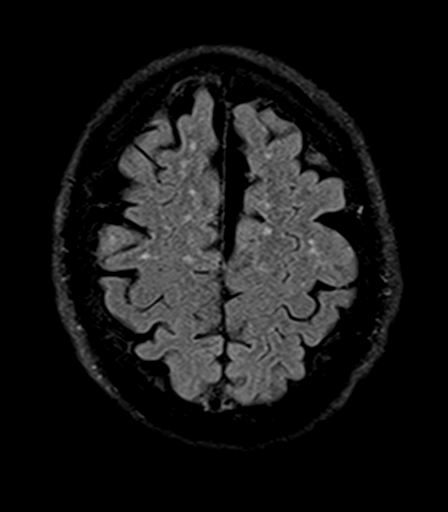
[im 38/38]
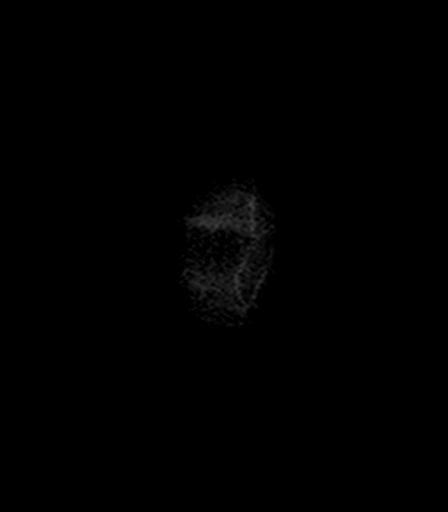

[Series 14: T2 · coronal · 5.0mm · 0.72mm/px · 4 of 28 slices shown (2 of 2)]
[im 1/28]
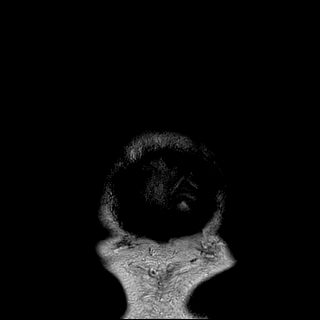
[im 10/28]
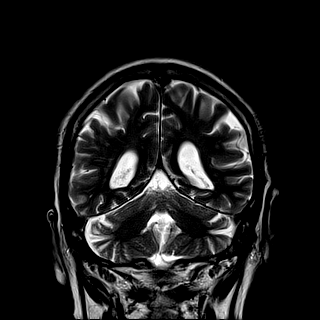
[im 19/28]
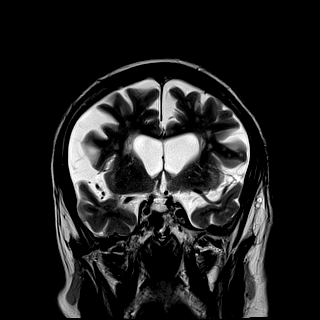
[im 28/28]
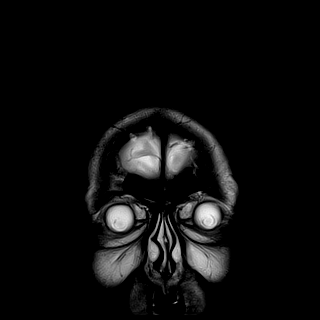

[Series 15: T1 · sagittal · 5.0mm · 0.80mm/px · 3 of 23 slices shown (2 of 2)]
[im 1/23]
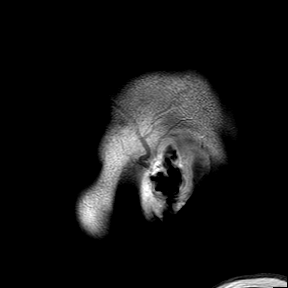
[im 12/23]
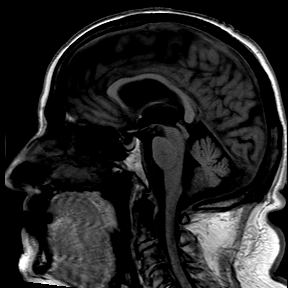
[im 23/23]
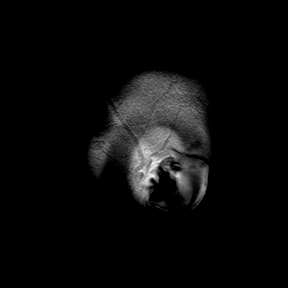

[40 of 48 positions shown; findings below may reference images not displayed]

FINDINGS: Brain: Negative for acute infarct.

Moderate atrophy. Chronic microvascular ischemic changes throughout
the white matter and pons. Small chronic infarct right cerebellum.
Negative for hemorrhage or mass

Vascular: Normal arterial flow voids

Skull and upper cervical spine: No focal skeletal lesion.

Sinuses/Orbits: Mild mucosal edema paranasal sinuses. Mastoid clear.
Bilateral cataract extraction.

Other: None
IMPRESSION: Negative for acute infarct

Moderate atrophy with mild to moderate chronic microvascular
ischemic changes in the white matter and pons.

## 2020-08-02 IMAGING — DX DG CHEST 1V PORT
1 series · 1 of 1 positions shown · non-contrast
Comparison: None.

CLINICAL DATA: Syncope.

EXAM:
PORTABLE CHEST 1 VIEW

[chest ap]
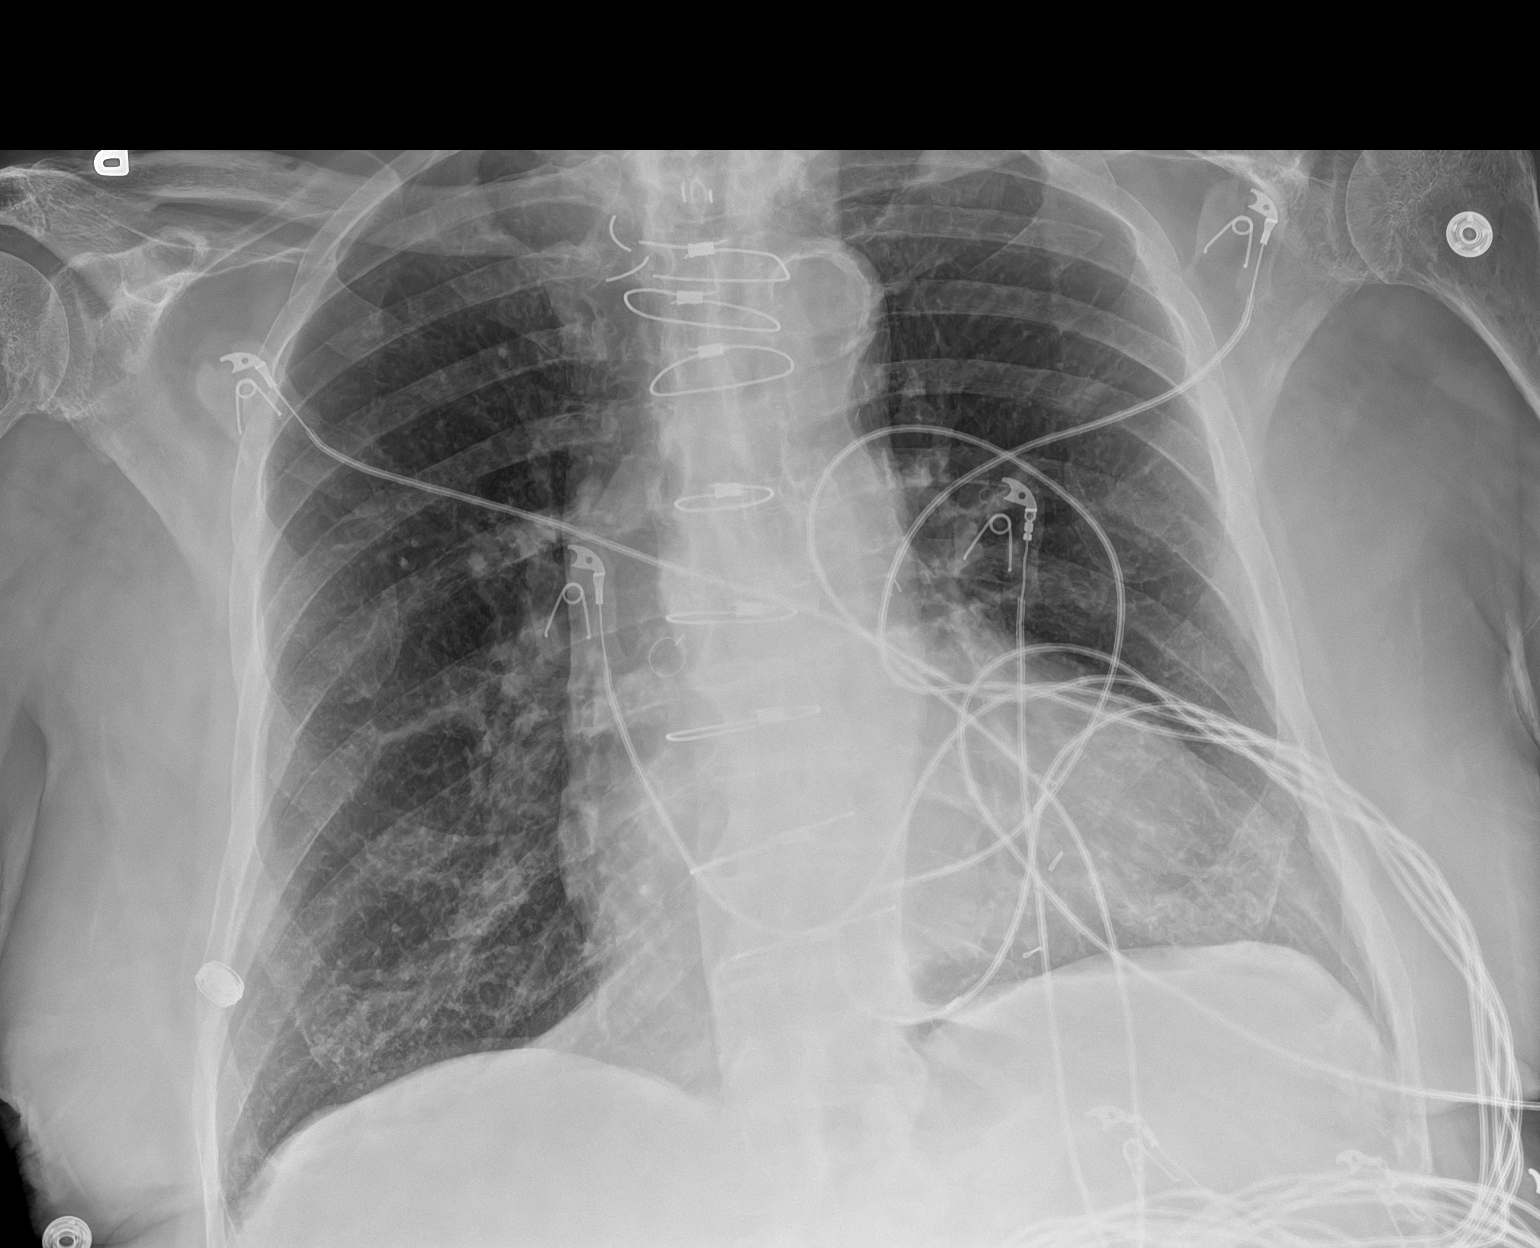

[1 of 1 positions shown; findings below may reference images not displayed]

FINDINGS: Mild cardiomegaly is noted. Status post coronary bypass graft. No
pneumothorax or pleural effusion is noted. Lungs are clear. Bony
thorax is unremarkable.
IMPRESSION: No active disease.

Aortic Atherosclerosis ([FS]-[FS]).

## 2020-08-02 MED ORDER — MECLIZINE HCL 12.5 MG PO TABS
25.0000 mg | ORAL_TABLET | Freq: Once | ORAL | Status: AC
  Administered 2020-08-02: 25 mg via ORAL
  Filled 2020-08-02: qty 2

## 2020-08-02 MED ORDER — SODIUM CHLORIDE 0.9 % IV BOLUS
500.0000 mL | Freq: Once | INTRAVENOUS | Status: AC
  Administered 2020-08-02: 500 mL via INTRAVENOUS

## 2020-08-02 MED ORDER — MECLIZINE HCL 25 MG PO TABS: 25.0000 mg | ORAL_TABLET | Freq: Three times a day (TID) | ORAL | 0 refills | Status: DC | PRN

## 2020-08-02 NOTE — ED Provider Notes (Signed)
Emergency Department Provider Note   I have reviewed the triage vital signs and the nursing notes.   HISTORY  Chief Complaint No chief complaint on file.   HPI Mitchell Chandler is a 86 y.o. male with past medical history reviewed below presents to the emergency department with acute onset lightheadedness which began this morning.  Patient states he has been dealing with intermittent generalized weakness for over a year now.  He has had medical evaluation with no clear diagnosis.  Denies any recent medication changes.  He cares for his elderly wife at home and states today he awoke feeling okay but when standing and trying to walk he felt very lightheaded like he might pass out.  He denies any unilateral weakness or numbness.  No vision changes.  Denies vertigo symptoms.  Denies headache, chest pain, heart palpitations.  Symptoms ultimately passed and states he is feeling better now.  Given the severity of symptoms this morning, however, he presents to the ED for evaluation.  Past Medical History:  Diagnosis Date  . Benign prostatic hyperplasia 12/25/2016  . 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 Redmond Pulling  . History of kidney stones   . Hypertension   . 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 Yearly surviellance.   . Renal stone 01/26/2020    Patient Active Problem List   Diagnosis Date Noted  . Arthritis 06/07/2020  . Benign prostatic hyperplasia 12/25/2016  . Dyslipidemia 12/25/2016  . Essential (primary) hypertension 12/25/2016    Past Surgical History:  Procedure Laterality Date  . COLONOSCOPY  10/02/2011   Procedure: COLONOSCOPY;  Surgeon: Rogene Houston, MD;  [facility];  Service: Endoscopy;  Laterality: N/A;  10:30 am  . COLONOSCOPY N/A 12/21/2014   Procedure: COLONOSCOPY;  Surgeon: Rogene Houston, MD;  [facility];  Service: Endoscopy;  Laterality: N/A;  1030    . CORONARY ARTERY BYPASS GRAFT  1995  . CYSTOSCOPY W/ URETERAL STENT PLACEMENT Left 01/26/2020   Procedure: CYSTOSCOPY WITH RETROGRADE PYELOGRAM/URETERAL STENT PLACEMENT;  Surgeon: Alexis Frock, MD;  [facility];  Service: Urology;  Laterality: Left;  . CYSTOSCOPY WITH RETROGRADE PYELOGRAM, URETEROSCOPY AND STENT PLACEMENT Left 11/19/2019   Procedure: CYSTOSCOPY WITH RETROGRADE PYELOGRAM, DIAGNOSTIC URETEROSCOPY AND STENT PLACEMENT;  Surgeon: Alexis Frock, MD;  Location: [facility];  Service: Urology;  Laterality: Left;  1  . EYE SURGERY     feb 7 feb 28 [place]  . KIDNEY STONE SURGERY    . NEPHROLITHOTOMY Left 01/26/2020   Procedure: NEPHROLITHOTOMY PERCUTANEOUS;  Surgeon: Alexis Frock, MD;  [facility];  Service: Urology;  Laterality: Left;  3 HRS  . PROSTATE BIOPSY      Allergies Patient has no known allergies.  Family History  Problem Relation Age of Onset  . Heart disease Mother   . Hypertension Father   . Hypertension Brother   . Hypertension Brother   . Hypertension Brother   . Hypertension Brother     Social History Social History   Tobacco Use  . Smoking status: Former Smoker    Quit date: 12/21/1962    Years since quitting: 57.6  . Smokeless tobacco: Never Used  Substance Use Topics  . Alcohol use: No  . Drug use: No    Review of Systems  Constitutional: No fever/chills. Positive chronic generalized weakness.  Eyes: No visual changes. ENT:  No sore throat. Cardiovascular: Denies chest pain. Positive near syncope.  Respiratory: Denies shortness of breath. Gastrointestinal: No abdominal pain.  No nausea, no vomiting.  No diarrhea.  No constipation. Genitourinary: Negative for dysuria. Musculoskeletal: Negative for back pain. Skin: Negative for rash. Neurological: Negative for headaches, focal weakness or numbness.  10-point ROS otherwise negative.  ____________________________________________   PHYSICAL  EXAM:  VITAL SIGNS: ED Triage Vitals  Enc Vitals Group     BP 08/02/20 1032 134/67     Pulse Rate 08/02/20 1032 (!) 51     Resp 08/02/20 1032 18     Temp 08/02/20 1032 98.4 F (36.9 C)     Temp Source 08/02/20 1032 Oral     SpO2 08/02/20 1032 99 %     Weight --      Height 08/02/20 1032 5\' 8"  (1.727 m)   Constitutional: Alert and oriented. Well appearing and in no acute distress. Eyes: Conjunctivae are normal.  Head: Atraumatic. Nose: No congestion/rhinnorhea. Mouth/Throat: Mucous membranes are moist.   Neck: No stridor.  Cardiovascular: Sinus bradycardia. Good peripheral circulation. Grossly normal heart sounds.   Respiratory: Normal respiratory effort.  No retractions. Lungs CTAB. Gastrointestinal: Soft and nontender. No distention.  Musculoskeletal: No lower extremity tenderness nor edema. No gross deformities of extremities. Neurologic:  Normal speech and language.  No facial asymmetry with normal sensation.  Equal strength in the bilateral upper and lower extremities (5/5).  Normal finger-to-nose testing.  Extraocular movement intact with PERRL (3 mm).  Skin:  Skin is warm, dry and intact. No rash noted.   ____________________________________________   LABS (all labs ordered are listed, but only abnormal results are displayed)  Labs Reviewed  BASIC METABOLIC PANEL - Abnormal; Notable for the following components:      Result Value   Glucose, Bld 139 (*)    GFR calc non Af Amer 58 (*)    All other components within normal limits  URINALYSIS, ROUTINE W REFLEX MICROSCOPIC - Abnormal; Notable for the following components:   Ketones, ur 5 (*)    All other components within normal limits  CBG MONITORING, ED - Abnormal; Notable for the following components:   Glucose-Capillary 136 (*)    All other components within normal limits  TROPONIN I (HIGH SENSITIVITY) - Abnormal; Notable for the following components:   Troponin I (High Sensitivity) 20 (*)    All other components  within normal limits  SARS CORONAVIRUS 2 BY RT PCR (HOSPITAL ORDER, [facility])  CBC  HEPATIC FUNCTION PANEL  LIPASE, BLOOD  TROPONIN I (HIGH SENSITIVITY)  TROPONIN I (HIGH SENSITIVITY)   ____________________________________________  EKG   EKG Interpretation  Date/Time:  Wednesday August 02 2020 10:28:56 EDT Ventricular Rate:  57 PR Interval:  320 QRS Duration: 110 QT Interval:  428 QTC Calculation: 416 R Axis:   35 Text Interpretation: Sinus bradycardia with 1st degree A-V block ST & T wave abnormality, consider lateral ischemia Abnormal ECG Similar to 2020 tracing. No STEMI Confirmed by Nanda Quinton [phone]) on 08/02/2020 10:37:34 AM       ____________________________________________  RADIOLOGY  MR BRAIN WO CONTRAST  Result Date: 08/02/2020 CLINICAL DATA:  Acute neuro deficit rule out stroke EXAM: MRI HEAD WITHOUT CONTRAST TECHNIQUE: Multiplanar, multiecho pulse sequences of the brain and surrounding structures were obtained without intravenous contrast. COMPARISON:  None. FINDINGS: Brain: Negative for acute infarct. Moderate atrophy. Chronic microvascular ischemic changes throughout the white matter and pons. Small chronic infarct right cerebellum. Negative for  hemorrhage or mass Vascular: Normal arterial flow voids Skull and upper cervical spine: No focal skeletal lesion. Sinuses/Orbits: Mild mucosal edema paranasal sinuses. Mastoid clear. Bilateral cataract extraction. Other: None IMPRESSION: Negative for acute infarct Moderate atrophy with mild to moderate chronic microvascular ischemic changes in the white matter and pons. Electronically Signed   By: Franchot Gallo M.D.   On: 08/02/2020 16:08   DG Chest Portable 1 View  Result Date: 08/02/2020 CLINICAL DATA:  Syncope. EXAM: PORTABLE CHEST 1 VIEW COMPARISON:  None. FINDINGS: Mild cardiomegaly is noted. Status post coronary bypass graft. No pneumothorax or pleural effusion is noted. Lungs are clear.  Bony thorax is unremarkable. IMPRESSION: No active disease. Aortic Atherosclerosis (ICD10-I70.0). Electronically Signed   By: Marijo Conception M.D.   On: 08/02/2020 12:15    ____________________________________________   PROCEDURES  Procedure(s) performed:   Procedures  None  ____________________________________________   INITIAL IMPRESSION / ASSESSMENT AND PLAN / ED COURSE  Pertinent labs & imaging results that were available during my care of the patient were reviewed by me and considered in my medical decision making (see chart for details).   Patient presents emergency department with near syncope this morning.  Symptoms have resolved but were severe.  No chest pain, palpitations, shortness of breath.  Patient does have history of hypertension and first-degree AV block.  He has relatively normal vital signs here.  Does have CAD history as well.  No specific angina type symptoms.  Lower suspicion for PE clinically.  Plan for IV fluids, orthostatic vitals, cardiogenic syncope work-up.  Patient has a normal neurologic exam and does not give a history to suspect posterior circulation stroke.   02:45 PM Patient reassessed.  He is continued have some lightheadedness but mainly with standing.  I got him up at bedside and personally walked him around the room.  He is having a very shuffling type gait and reports feeling lightheaded.  No clear vertigo symptoms.  Given his gait abnormality I do plan for MRI brain to rule out stroke.  Patient's troponin went from 15-20.  Plan for third troponin but he is having no chest pain.  If third troponin is not significantly increased would be okay with outpatient follow-up.   Care transferred to Dr. Roderic Palau pending MRI.  ____________________________________________  FINAL CLINICAL IMPRESSION(S) / ED DIAGNOSES  Final diagnoses:  Dizziness     MEDICATIONS GIVEN DURING THIS VISIT:  Medications  sodium chloride 0.9 % bolus 500 mL (0 mLs Intravenous  Stopped 08/02/20 1432)  meclizine (ANTIVERT) tablet 25 mg (25 mg Oral Given 08/02/20 1758)     NEW OUTPATIENT MEDICATIONS STARTED DURING THIS VISIT:  Discharge Medication List as of 08/02/2020  5:23 PM    START taking these medications   Details  meclizine (ANTIVERT) 25 MG tablet Take 1 tablet (25 mg total) by mouth 3 (three) times daily as needed for dizziness., Starting Wed 08/02/2020, Normal        Note:  This document was prepared using Dragon voice recognition software and may include unintentional dictation errors.  Nanda Quinton, MD, [facility] Emergency Medicine    Ramiz Turpin, [facility], MD 08/02/20 2022

## 2020-08-02 NOTE — ED Triage Notes (Signed)
Patient states he has had weakness for over a year now.  Got up this morning and states he could hardly walk.  Had to hold on to something to walk due to dizziness

## 2020-08-02 NOTE — ED Notes (Signed)
Pt to MRI

## 2020-08-02 NOTE — Discharge Instructions (Addendum)
Use a walker to ambulate with.  Follow-up with your family doctor next week.  Return if problems

## 2020-08-08 ENCOUNTER — Emergency Department (HOSPITAL_COMMUNITY)
Admission: EM | Admit: 2020-08-08 | Discharge: 2020-08-08 | Disposition: A | Discharge status: Home / Self Care | Payer: Medicare Other | Attending: Emergency Medicine | Admitting: Emergency Medicine

## 2020-08-08 ENCOUNTER — Encounter (HOSPITAL_COMMUNITY): Payer: Self-pay | Admitting: *Deleted

## 2020-08-08 ENCOUNTER — Other Ambulatory Visit: Payer: Self-pay

## 2020-08-08 DIAGNOSIS — I10 Essential (primary) hypertension: Secondary | ICD-10-CM | POA: Insufficient documentation

## 2020-08-08 DIAGNOSIS — R001 Bradycardia, unspecified: Secondary | ICD-10-CM | POA: Diagnosis not present

## 2020-08-08 DIAGNOSIS — M6281 Muscle weakness (generalized): Secondary | ICD-10-CM | POA: Insufficient documentation

## 2020-08-08 DIAGNOSIS — R531 Weakness: Secondary | ICD-10-CM | POA: Insufficient documentation

## 2020-08-08 DIAGNOSIS — Z79899 Other long term (current) drug therapy: Secondary | ICD-10-CM | POA: Diagnosis not present

## 2020-08-08 DIAGNOSIS — Z7982 Long term (current) use of aspirin: Secondary | ICD-10-CM | POA: Insufficient documentation

## 2020-08-08 DIAGNOSIS — Z87891 Personal history of nicotine dependence: Secondary | ICD-10-CM | POA: Insufficient documentation

## 2020-08-08 LAB — URINALYSIS, ROUTINE W REFLEX MICROSCOPIC
Bilirubin Urine: NEGATIVE
Glucose, UA: NEGATIVE mg/dL
Hgb urine dipstick: NEGATIVE
Ketones, ur: 20 mg/dL — AB
Leukocytes,Ua: NEGATIVE
Nitrite: NEGATIVE
Protein, ur: NEGATIVE mg/dL
Specific Gravity, Urine: 1.014 (ref 1.005–1.030)
pH: 6 (ref 5.0–8.0)

## 2020-08-08 LAB — CBC WITH DIFFERENTIAL/PLATELET
Abs Immature Granulocytes: 0.01 10*3/uL (ref 0.00–0.07)
Basophils Absolute: 0 10*3/uL (ref 0.0–0.1)
Basophils Relative: 0 %
Eosinophils Absolute: 0.2 10*3/uL (ref 0.0–0.5)
Eosinophils Relative: 2 %
HCT: 48.2 % (ref 39.0–52.0)
Hemoglobin: 16 g/dL (ref 13.0–17.0)
Immature Granulocytes: 0 %
Lymphocytes Relative: 20 %
Lymphs Abs: 1.4 10*3/uL (ref 0.7–4.0)
MCH: 32 pg (ref 26.0–34.0)
MCHC: 33.2 g/dL (ref 30.0–36.0)
MCV: 96.4 fL (ref 80.0–100.0)
Monocytes Absolute: 0.4 10*3/uL (ref 0.1–1.0)
Monocytes Relative: 5 %
Neutro Abs: 5.3 10*3/uL (ref 1.7–7.7)
Neutrophils Relative %: 73 %
Platelets: 194 10*3/uL (ref 150–400)
RBC: 5 MIL/uL (ref 4.22–5.81)
RDW: 12.8 % (ref 11.5–15.5)
WBC: 7.3 10*3/uL (ref 4.0–10.5)
nRBC: 0 % (ref 0.0–0.2)

## 2020-08-08 LAB — BASIC METABOLIC PANEL
Anion gap: 6 (ref 5–15)
BUN: 14 mg/dL (ref 8–23)
CO2: 21 mmol/L — ABNORMAL LOW (ref 22–32)
Calcium: 9 mg/dL (ref 8.9–10.3)
Chloride: 108 mmol/L (ref 98–111)
Creatinine, Ser: 0.99 mg/dL (ref 0.61–1.24)
GFR calc Af Amer: >60 mL/min (ref >60)
GFR calc non Af Amer: >60 mL/min (ref >60)
Glucose, Bld: 91 mg/dL (ref 70–99)
Potassium: 3.8 mmol/L (ref 3.5–5.1)
Sodium: 135 mmol/L (ref 135–145)

## 2020-08-08 MED ORDER — MECLIZINE HCL 25 MG PO TABS: 25.0000 mg | ORAL_TABLET | Freq: Three times a day (TID) | ORAL | 0 refills | Status: DC | PRN

## 2020-08-08 MED ORDER — LACTATED RINGERS IV BOLUS
500.0000 mL | Freq: Once | INTRAVENOUS | Status: AC
  Administered 2020-08-08: 500 mL via INTRAVENOUS

## 2020-08-08 NOTE — ED Triage Notes (Signed)
Pt brought in by [facility] for c/o continued weakness, pt was seen here last week for same complaint and pt reports he feels like he has rocks in his pockets and is unable to lift his legs without assistance; pt had some orthostatic vital changes with ems when he stood up; pt was given a 526ml NS bolus en route by ems

## 2020-08-08 NOTE — Evaluation (Signed)
Physical Therapy Evaluation Patient Details Name: Mitchell Chandler MRN: [id number] DOB: 1932-05-21 Today's Date: 08/08/2020   History of Present Illness  Mitchell Chandler is a 86 y.o. male with past medical history reviewed below presents to the emergency department with acute onset lightheadedness which began this morning.  Patient states he has been dealing with intermittent generalized weakness for over a year now.  He has had medical evaluation with no clear diagnosis.  Denies any recent medication changes.  He cares for his elderly wife at home and states today he awoke feeling okay but when standing and trying to walk he felt very lightheaded like he might pass out.  He denies any unilateral weakness or numbness.  No vision changes.  Denies vertigo symptoms.  Denies headache, chest pain, heart palpitations.  Symptoms ultimately passed and states he is feeling better now.  Given the severity of symptoms this morning, however, he presents to the ED for evaluation.    Clinical Impression  Patient functioning near baseline for functional mobility and gait, other than c/o mild dizziness upon sitting up at bedside, able to ambulate in hallway without loss of balance using RW, had to slow cadence when taking steps without AD.  Patient encouraged to use his walker at home and ask family members for assistance as needed.  Plan:  Patient discharged from physical therapy to care of nursing for ambulation daily as tolerated for length of stay.     Follow Up Recommendations Home health PT;Supervision - Intermittent;Supervision for mobility/OOB    Equipment Recommendations  None recommended by PT    Recommendations for Other Services       Precautions / Restrictions Precautions Precautions: Fall Restrictions Weight Bearing Restrictions: No      Mobility  Bed Mobility Overal bed mobility: Modified Independent             General bed mobility comments: increased time, labored  movement  Transfers Overall transfer level: Modified independent Equipment used: Rolling walker (2 wheeled);1 person hand held assist                Ambulation/Gait Ambulation/Gait assistance: Supervision Gait Distance (Feet): 100 Feet Assistive device: Rolling walker (2 wheeled) Gait Pattern/deviations: Decreased step length - right;Decreased step length - left;Decreased stride length Gait velocity: decreased   General Gait Details: slow labored cadence with decreased arm swing when not using RW, good return for using RW without loss of balance  Stairs            Wheelchair Mobility    Modified Rankin (Stroke Patients Only)       Balance Overall balance assessment: Needs assistance Sitting-balance support: No upper extremity supported;Feet supported Sitting balance-Leahy Scale: Good Sitting balance - Comments: seated at EOB   Standing balance support: No upper extremity supported;During functional activity Standing balance-Leahy Scale: Fair Standing balance comment: fair/good using RW                             Pertinent Vitals/Pain Pain Assessment: Faces Faces Pain Scale: Hurts a little bit Pain Location: back of head once sitting, dizziness Pain Descriptors / Indicators: Aching;Discomfort Pain Intervention(s): Limited activity within patient's tolerance;Monitored during session    [place] expects to be discharged to:: Private residence Living Arrangements: Spouse/significant other Available Help at Discharge: Family;Available 24 hours/day Type of Home: House Home Access: Stairs to enter Entrance Stairs-Rails: None Entrance Stairs-Number of Steps: 1 Home Layout: One level Home  Equipment: Gilford Rile - 2 wheels;Bedside commode      Prior Function Level of Independence: Independent with assistive device(s)         Comments: household and short distanced community ambulator with RW PRN     Hand Dominance         Extremity/Trunk Assessment   Upper Extremity Assessment Upper Extremity Assessment: Overall WFL for tasks assessed    Lower Extremity Assessment Lower Extremity Assessment: Generalized weakness    Cervical / Trunk Assessment Cervical / Trunk Assessment: Normal  Communication   Communication: No difficulties  Cognition Arousal/Alertness: Awake/alert Behavior During Therapy: WFL for tasks assessed/performed Overall Cognitive Status: Within Functional Limits for tasks assessed                                        General Comments      Exercises     Assessment/Plan    PT Assessment All further PT needs can be met in the next venue of care  PT Problem List Decreased strength;Decreased activity tolerance;Decreased balance;Decreased mobility       PT Treatment Interventions      PT Goals (Current goals can be found in the Care Plan section)  Acute Rehab PT Goals Patient Stated Goal: return home with family to assist PT Goal Formulation: With patient Time For Goal Achievement: 08/08/20 Potential to Achieve Goals: Good    Frequency     Barriers to discharge        Co-evaluation               AM-PAC PT "6 Clicks" Mobility  Outcome Measure Help needed turning from your back to your side while in a flat bed without using bedrails?: None Help needed moving from lying on your back to sitting on the side of a flat bed without using bedrails?: None Help needed moving to and from a bed to a chair (including a wheelchair)?: A Little Help needed standing up from a chair using your arms (e.g., wheelchair or bedside chair)?: A Little Help needed to walk in hospital room?: A Little Help needed climbing 3-5 steps with a railing? : A Little 6 Click Score: 20    End of Session   Activity Tolerance: Patient tolerated treatment well;Patient limited by fatigue Patient left: in bed;with call bell/phone within reach Nurse Communication: Mobility status PT  Visit Diagnosis: Unsteadiness on feet (R26.81);Other abnormalities of gait and mobility (R26.89);Muscle weakness (generalized) (M62.81)    Time: 9558-3167 PT Time Calculation (min) (ACUTE ONLY): 25 min   Charges:   PT Evaluation $PT Eval Moderate Complexity: 1 Mod PT Treatments $Therapeutic Activity: 23-37 mins        3:56 PM, 08/08/20 Lonell Grandchild, MPT Physical Therapist with [facility] [phone] office [phone] mobile phone

## 2020-08-08 NOTE — TOC Initial Note (Addendum)
Transition of Care [facility]) - Initial/Assessment Note   Patient Details  Name: Mitchell Chandler MRN: [id number] Date of Birth: 27-Apr-1932  Transition of Care [facility] CM/SW Contact:    Sherie Don, LCSW Phone Number: 08/08/2020, 3:19 PM  Clinical Narrative: Patient is an 86 year old male who presented to the ED for weakness. TOC received consult for HH/SNF needs. CSW attempted to call the patient twice as well as his wife, daughter, and son-in-law, but was unable to reach any family at this time. Awaiting PT evaluation for recommendations for HH vs. SNF. TOC to follow.  Addendum: CSW received return call from daughter, Melissa Back. CSW discussed SNF vs. HH with daughter. Per daughter, she believes the patient will be agreeable to SNF and will discuss it with family.  CSW notified by PT the patient was evaluated and HHPT is recommended. CSW spoke with daughter and the family is requesting [facility]. CSW made referral to [facility] with [facility]. TOC signing off.  Expected Discharge Plan: [place] Barriers to Discharge: Continued Medical Work up  Patient Goals and CMS Choice Patient states their goals for this hospitalization and ongoing recovery are:: Discharge to SNF or home with [facility] CMS Medicare.gov Compare Post Acute Care list provided to:: Patient Choice offered to / list presented to : Patient  Expected Discharge Plan and Services Expected Discharge Plan: [place] In-house Referral: Clinical Social Work Discharge Planning Services: NA Post Acute Care Choice: [place] Living arrangements for the past 2 months: [place]  Prior Living Arrangements/Services Living arrangements for the past 2 months: Single Family Home Lives with:: Spouse Patient language and need for interpreter reviewed:: Yes Do you feel safe going back to the place where you live?: Yes      Need for Family Participation in Patient Care: No (Comment) Care giver support  system in place?: Yes (comment) Criminal Activity/Legal Involvement Pertinent to Current Situation/Hospitalization: No - Comment as needed  Permission Sought/Granted Permission sought to share information with : Facility Art therapist granted to share info w AGENCY: SNFs or [facility] providers  Emotional Assessment Appearance:: Appears stated age Attitude/Demeanor/Rapport: Engaged Affect (typically observed): Accepting Orientation: : Oriented to Self, Oriented to Place, Oriented to  Time, Oriented to Situation Alcohol / Substance Use: Not Applicable Psych Involvement: No (comment)  Admission diagnosis:  weakness Patient Active Problem List   Diagnosis Date Noted  . Arthritis 06/07/2020  . Benign prostatic hyperplasia 12/25/2016  . Dyslipidemia 12/25/2016  . Essential (primary) hypertension 12/25/2016   PCP:  Loman Brooklyn, FNP Pharmacy:   [place], [address] [place] [address] Phone: [phone] Fax: [place], [address] [place] [address] Phone: [phone] Fax: [phone]  Readmission Risk Interventions No flowsheet data found.

## 2020-08-08 NOTE — Discharge Instructions (Addendum)
We are going to arrange home health physical therapy for you to help you gain strength and mobility.

## 2020-08-08 NOTE — ED Provider Notes (Signed)
[facility] EMERGENCY DEPARTMENT Provider Note   CSN: [id number] Arrival date & time: 08/08/20  0935     History Chief Complaint  Patient presents with  . Weakness    Mitchell Chandler is a 86 y.o. male.  HPI   88yM with generalized weakness. Recently seen for the same. He has no energy. Having difficulty getting up and ambulating. He feels like he "has rocks in his pockets" that are pulling him down. Chronic neck pain. No acute pain otherwise. No fever. No cough. No urinary complaints.   Past Medical History:  Diagnosis Date  . Benign prostatic hyperplasia 12/25/2016  . 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  . History of kidney stones   . Hypertension   . 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 Yearly surviellance.   . Renal stone 01/26/2020   Patient Active Problem List   Diagnosis Date Noted  . Arthritis 06/07/2020  . Benign prostatic hyperplasia 12/25/2016  . Dyslipidemia 12/25/2016  . Essential (primary) hypertension 12/25/2016   Past Surgical History:  Procedure Laterality Date  . COLONOSCOPY  10/02/2011   Procedure: COLONOSCOPY;  Surgeon: Rogene Houston, MD;  [facility];  Service: Endoscopy;  Laterality: N/A;  10:30 am  . COLONOSCOPY N/A 12/21/2014   Procedure: COLONOSCOPY;  Surgeon: Rogene Houston, MD;  [facility];  Service: Endoscopy;  Laterality: N/A;  1030  . CORONARY ARTERY BYPASS GRAFT  1995  . CYSTOSCOPY W/ URETERAL STENT PLACEMENT Left 01/26/2020   Procedure: CYSTOSCOPY WITH RETROGRADE PYELOGRAM/URETERAL STENT PLACEMENT;  Surgeon: Alexis Frock, MD;  Location: [facility] OR[facility];  Service: Urology;  Laterality: Left;  . CYSTOSCOPY WITH RETROGRADE PYELOGRAM, URETEROSCOPY AND STENT PLACEMENT Left 11/19/2019   Procedure: CYSTOSCOPY WITH RETROGRADE PYELOGRAM, DIAGNOSTIC URETEROSCOPY AND STENT PLACEMENT;  Surgeon: Alexis Frock, MD;  Location: [facility];  Service: Urology;  Laterality: Left;  1  . EYE SURGERY     feb 7 feb 28 [place]  . KIDNEY STONE SURGERY    . NEPHROLITHOTOMY Left 01/26/2020   Procedure: NEPHROLITHOTOMY PERCUTANEOUS;  Surgeon: Alexis Frock, MD;  Location: [facility];  Service: Urology;  Laterality: Left;  3 HRS  . PROSTATE BIOPSY       Family History  Problem Relation Age of Onset  . Heart disease Mother   . Hypertension Father   . Hypertension Brother   . Hypertension Brother   . Hypertension Brother   . Hypertension Brother    Social History   Tobacco Use  . Smoking status: Former Smoker    Quit date: 12/21/1962    Years since quitting: 57.6  . Smokeless tobacco: Never Used  Substance Use Topics  . Alcohol use: No  . Drug use: No    Home Medications Prior to Admission medications   Medication Sig Start Date End Date Taking? Authorizing Provider  acetaminophen (TYLENOL) 500 MG tablet Take 500-1,000 mg by mouth every 6 (six) hours as needed (for arthritis pain (neck/shoulder)).     [provider]  amLODipine-benazepril (LOTREL) 10-20 MG capsule Take 1 capsule by mouth daily at 12 noon. 06/07/20   Loman Brooklyn, FNP  aspirin 81 MG EC tablet Take 81 mg by mouth daily. Swallow whole.    [provider]  doxazosin (CARDURA) 8 MG tablet Take 1 tablet (8 mg total) by mouth daily with supper. 06/07/20   Blanch Media,  Shireen Quan, FNP  Folic Acid-Vit U7-OZD G64 (FOLBEE) 2.5-25-1 MG TABS tablet Take 1 tablet by mouth daily at 12 noon. 06/07/20   Loman Brooklyn, FNP  furosemide (LASIX) 20 MG tablet Take 1 tablet (20 mg total) by mouth daily. 06/07/20   Loman Brooklyn, FNP  meclizine (ANTIVERT) 25 MG tablet Take 1 tablet (25 mg total) by mouth 3 (three) times daily as needed for dizziness. 08/02/20   Milton Ferguson, MD  metoprolol tartrate (LOPRESSOR) 50 MG tablet Take 1 tablet (50 mg total) by mouth 2 (two) times daily. 06/07/20   Loman Brooklyn, FNP  simvastatin (ZOCOR) 20 MG tablet  Take 1 tablet (20 mg total) by mouth at bedtime. 06/07/20   Loman Brooklyn, FNP    Allergies    Patient has no known allergies.  Review of Systems   Review of Systems All systems reviewed and negative, other than as noted in HPI.  Physical Exam Updated Vital Signs BP (!) 161/60 (BP Location: Right Arm)   Pulse 63   Temp 98.4 F (36.9 C) (Oral)   Resp 18   Ht 5\' 8"  (1.727 m)   Wt 81.6 kg   SpO2 98%   BMI 27.37 kg/m   Physical Exam Vitals and nursing note reviewed.  Constitutional:      General: He is not in acute distress.    Appearance: He is well-developed.  HENT:     Head: Normocephalic and atraumatic.  Eyes:     General:        Right eye: No discharge.        Left eye: No discharge.     Conjunctiva/sclera: Conjunctivae normal.  Cardiovascular:     Rate and Rhythm: Normal rate and regular rhythm.     Heart sounds: Normal heart sounds. No murmur heard.  No friction rub. No gallop.   Pulmonary:     Effort: Pulmonary effort is normal. No respiratory distress.     Breath sounds: Normal breath sounds.  Abdominal:     General: There is no distension.     Palpations: Abdomen is soft.     Tenderness: There is no abdominal tenderness.  Musculoskeletal:        General: No tenderness.     Cervical back: Neck supple.  Skin:    General: Skin is warm and dry.  Neurological:     Mental Status: He is alert.     Comments: Alert and oriented. Strength 5/5 b/l LE. Sensation intact to light touch.   Psychiatric:        Behavior: Behavior normal.        Thought Content: Thought content normal.    ED Results / Procedures / Treatments   Labs (all labs ordered are listed, but only abnormal results are displayed) Labs Reviewed  BASIC METABOLIC PANEL - Abnormal; Notable for the following components:      Result Value   CO2 21 (*)    All other components within normal limits  URINALYSIS, ROUTINE W REFLEX MICROSCOPIC - Abnormal; Notable for the following components:    Ketones, ur 20 (*)    All other components within normal limits  CBC WITH DIFFERENTIAL/PLATELET   EKG EKG Interpretation  Date/Time:  Tuesday August 08 2020 11:50:47 EDT Ventricular Rate:  56 PR Interval:  302 QRS Duration: 104 QT Interval:  444 QTC Calculation: 428 R Axis:   36 Text Interpretation: Sinus bradycardia with 1st degree A-V block ST & T wave abnormality, consider lateral ischemia  Abnormal ECG Confirmed by Virgel Manifold [phone]) on 08/08/2020 1:49:58 PM   Radiology No results found.  Procedures Procedures (including critical care time)  Medications Ordered in ED Medications  lactated ringers bolus 500 mL (0 mLs Intravenous Stopped 08/08/20 1520)    ED Course  I have reviewed the triage vital signs and the nursing notes.  Pertinent labs & imaging results that were available during my care of the patient were reviewed by me and considered in my medical decision making (see chart for details).    MDM Rules/Calculators/A&P                          88yM with generalized weakness. Neuro exam nonfocal. No emergent condition but he is having difficulty taking care of himself at home. Lives with elderly wife. May benefit from placement. No indication for hospitalization at this time though. Social work [place]. Outpt FU otherwise.   HHPT arranged for. Pt was able to ambulate with PT in the ED.   Final Clinical Impression(s) / ED Diagnoses Final diagnoses:  Generalized weakness    Rx / DC Orders ED Discharge Orders    None       Virgel Manifold, MD 08/09/20 [phone]

## 2020-08-10 ENCOUNTER — Encounter: Payer: Self-pay | Admitting: Family Medicine

## 2020-08-10 ENCOUNTER — Ambulatory Visit (INDEPENDENT_AMBULATORY_CARE_PROVIDER_SITE_OTHER): Payer: Medicare Other | Admitting: Family Medicine

## 2020-08-10 ENCOUNTER — Other Ambulatory Visit: Payer: Self-pay

## 2020-08-10 VITALS — BP 101/56 | HR 54 | Temp 97.2°F

## 2020-08-10 DIAGNOSIS — R42 Dizziness and giddiness: Secondary | ICD-10-CM

## 2020-08-10 DIAGNOSIS — R5383 Other fatigue: Secondary | ICD-10-CM

## 2020-08-10 DIAGNOSIS — N1831 Chronic kidney disease, stage 3a: Secondary | ICD-10-CM

## 2020-08-10 DIAGNOSIS — I10 Essential (primary) hypertension: Secondary | ICD-10-CM | POA: Diagnosis not present

## 2020-08-10 MED ORDER — MECLIZINE HCL 25 MG PO TABS: 25.0000 mg | ORAL_TABLET | Freq: Three times a day (TID) | ORAL | 2 refills | Status: DC | PRN

## 2020-08-10 NOTE — Progress Notes (Signed)
Assessment & Plan:  1. Dizziness - Meclizine does help - refilled. Continue PT at home.  - meclizine (ANTIVERT) 25 MG tablet; Take 1 tablet (25 mg total) by mouth 3 (three) times daily as needed for dizziness.  Dispense: 30 tablet; Refill: 2 - Anemia Profile B  2. Fatigue, unspecified type - Continue PT at home.  - Anemia Profile B  3. Essential (primary) hypertension - Soft BP readings. Metoprolol D/C'd to see if this could be contributing to patient not feeling great. Patient will monitor BP once daily at home and keep a log to bring back to his next appointment.   4. Stage 3a chronic kidney disease - VITAMIN D 25 Hydroxy (Vit-D Deficiency, Fractures)   Return in about 3 weeks (around 08/31/2020) for HTN.  [name], MSN, APRN, FNP-C [facility]  Subjective:    Patient ID: Mitchell Chandler, male    DOB: [birth year], 86 y.o.   MRN: [id number]  Patient Care Team: Loman Brooklyn, FNP as PCP - General (Family Medicine) Leroy Sea, MD as Referring Physician (Urology)   Chief Complaint:  Chief Complaint  Patient presents with   Hospitalization Follow-up    [facility] was seen at [facility] & + 9/7 or weakness and dizziness.  Patient states he is no better since he left the hospital.  Having on and off dizziness when moving.    HPI: Mitchell Chandler is a 86 y.o. male presenting on 08/10/2020 for Hospitalization Follow-up (Patient was seen at [facility] & + 9/7 or weakness and dizziness.  Patient states he is no better since he left the hospital.  Having on and off dizziness when moving.)  Patient is accompanied by his son-in-law, who he is okay with being present.  He reports he and his wife are now living with their daughter and son-in-law.  Patient was seen at [facility], ER on 08/02/2020 due to dizziness and weakness.  MRI of the brain negative for acute infarct.  He does have moderate atrophy with mild to moderate chronic microvascular ischemic changes in the  white matter and pons.  Patient was hydrated in the ER and discharged with meclizine as needed.  Patient went back to [facility], ER on 08/08/2020 with similar complaints.  He was given IV fluids and home health physical therapy was arranged for patient.  His son-in-law reports today they have been monitoring his blood pressure several times per day.  He reports his systolic is mostly in the low 100s and has never gone up over 120.  His diastolic ranges 93-90.  Home health physical therapist came out today.  New complaints: None  Social history:  Relevant past medical, surgical, family and social history reviewed and updated as indicated. Interim medical history since our last visit reviewed.  Allergies and medications reviewed and updated.  DATA REVIEWED: CHART IN EPIC  ROS: Negative unless specifically indicated above in HPI.    Current Outpatient Medications:    acetaminophen (TYLENOL) 500 MG tablet, Take 500-1,000 mg by mouth every 6 (six) hours as needed (for arthritis pain (neck/shoulder)). , Disp: , Rfl:    amLODipine-benazepril (LOTREL) 10-20 MG capsule, Take 1 capsule by mouth daily at 12 noon., Disp: 90 capsule, Rfl: 1   aspirin 81 MG EC tablet, Take 81 mg by mouth daily. Swallow whole., Disp: , Rfl:    doxazosin (CARDURA) 8 MG tablet, Take 1 tablet (8 mg total) by mouth daily with supper., Disp: 90 tablet, Rfl: 1  Folic Acid-Vit H0-QMV H84 (FOLBEE) 2.5-25-1 MG TABS tablet, Take 1 tablet by mouth daily at 12 noon., Disp: 90 tablet, Rfl: 1   furosemide (LASIX) 20 MG tablet, Take 1 tablet (20 mg total) by mouth daily., Disp: 90 tablet, Rfl: 1   meclizine (ANTIVERT) 25 MG tablet, Take 1 tablet (25 mg total) by mouth 3 (three) times daily as needed for dizziness., Disp: 20 tablet, Rfl: 0   meclizine (ANTIVERT) 25 MG tablet, Take 1 tablet (25 mg total) by mouth 3 (three) times daily as needed for dizziness., Disp: 20 tablet, Rfl: 0   metoprolol tartrate (LOPRESSOR) 50 MG  tablet, Take 1 tablet (50 mg total) by mouth 2 (two) times daily., Disp: 180 tablet, Rfl: 1   simvastatin (ZOCOR) 20 MG tablet, Take 1 tablet (20 mg total) by mouth at bedtime., Disp: 90 tablet, Rfl: 1   No Known Allergies Past Medical History:  Diagnosis Date   Benign prostatic hyperplasia 12/25/2016   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 Yearly surviellance.    Renal stone 01/26/2020    Past Surgical History:  Procedure Laterality Date   COLONOSCOPY  10/02/2011   Procedure: COLONOSCOPY;  Surgeon: Rogene Houston, MD;  [facility];  Service: Endoscopy;  Laterality: N/A;  10:30 am   COLONOSCOPY N/A 12/21/2014   Procedure: COLONOSCOPY;  Surgeon: Rogene Houston, MD;  [facility];  Service: Endoscopy;  Laterality: N/A;  1030   CORONARY ARTERY BYPASS GRAFT  1995   CYSTOSCOPY W/ URETERAL STENT PLACEMENT Left 01/26/2020   Procedure: CYSTOSCOPY WITH RETROGRADE PYELOGRAM/URETERAL STENT PLACEMENT;  Surgeon: Alexis Frock, MD;  Location: [facility];  Service: Urology;  Laterality: Left;   CYSTOSCOPY WITH RETROGRADE PYELOGRAM, URETEROSCOPY AND STENT PLACEMENT Left 11/19/2019   Procedure: CYSTOSCOPY WITH RETROGRADE PYELOGRAM, DIAGNOSTIC URETEROSCOPY AND STENT PLACEMENT;  Surgeon: Alexis Frock, MD;  Location: [facility];  Service: Urology;  Laterality: Left;  1   EYE SURGERY     feb 7 feb 28 [facility]   KIDNEY STONE SURGERY     NEPHROLITHOTOMY Left 01/26/2020   Procedure: NEPHROLITHOTOMY PERCUTANEOUS;  Surgeon: Alexis Frock, MD;  [facility];  Service: Urology;  Laterality: Left;  3 HRS   PROSTATE BIOPSY      Social History   Socioeconomic History   Marital status: Married    Spouse name: Not on file   Number of children: Not on file   Years of  education: Not on file   Highest education level: Not on file  Occupational History   Not on file  Tobacco Use   Smoking status: Former Smoker    Quit date: 12/21/1962    Years since quitting: 13.6   Smokeless tobacco: Never Used  Substance and Sexual Activity   Alcohol use: No   Drug use: No   Sexual activity: Not on file  Other Topics Concern   Not on file  Social History Narrative   Not on file   Social Determinants of Health   Financial Resource Strain:    Difficulty of Paying Living Expenses: Not on file  Food Insecurity:    Worried About [place] in the Last Year: Not on file   [employer] of Food in the Last Year:  Not on file  Transportation Needs:    Lack of Transportation (Medical): Not on file   Lack of Transportation (Non-Medical): Not on file  Physical Activity:    Days of Exercise per Week: Not on file   Minutes of Exercise per Session: Not on file  Stress:    Feeling of Stress : Not on file  Social Connections:    Frequency of Communication with Friends and Family: Not on file   Frequency of Social Gatherings with Friends and Family: Not on file   Attends Religious Services: Not on file   Active Member of Clubs or Organizations: Not on file   Attends Archivist Meetings: Not on file   Marital Status: Not on file  Intimate Partner Violence:    Fear of Current or Ex-Partner: Not on file   Emotionally Abused: Not on file   Physically Abused: Not on file   Sexually Abused: Not on file        Objective:    BP (!) 101/56    Pulse (!) 54    Temp (!) 97.2 F (36.2 C) (Temporal)    SpO2 94%   Wt Readings from Last 3 Encounters:  08/08/20 180 lb (81.6 kg)  06/07/20 182 lb 12.8 oz (82.9 kg)  01/26/20 186 lb 15.2 oz (84.8 kg)    Physical Exam Vitals reviewed.  Constitutional:      General: He is not in acute distress.    Appearance: Normal appearance. He is not ill-appearing, toxic-appearing or diaphoretic.    HENT:     Head: Normocephalic and atraumatic.  Eyes:     General: No scleral icterus.       Right eye: No discharge.        Left eye: No discharge.     Conjunctiva/sclera: Conjunctivae normal.  Cardiovascular:     Rate and Rhythm: Normal rate and regular rhythm.     Heart sounds: Normal heart sounds. No murmur heard.  No friction rub. No gallop.   Pulmonary:     Effort: Pulmonary effort is normal. No respiratory distress.     Breath sounds: Normal breath sounds. No stridor. No wheezing, rhonchi or rales.  Musculoskeletal:        General: Normal range of motion.     Cervical back: Normal range of motion.  Skin:    General: Skin is warm and dry.  Neurological:     Mental Status: He is alert and oriented to person, place, and time. Mental status is at baseline.     Gait: Gait abnormal (riding in [facility]).  Psychiatric:        Mood and Affect: Mood normal.        Behavior: Behavior normal.        Thought Content: Thought content normal.        Judgment: Judgment normal.     No results found for: TSH Lab Results  Component Value Date   WBC 7.3 08/08/2020   HGB 16.0 08/08/2020   HCT 48.2 08/08/2020   MCV 96.4 08/08/2020   PLT 194 08/08/2020   Lab Results  Component Value Date   NA 135 08/08/2020   K 3.8 08/08/2020   CO2 21 (L) 08/08/2020   GLUCOSE 91 08/08/2020   BUN 14 08/08/2020   CREATININE 0.99 08/08/2020   BILITOT 0.8 08/02/2020   ALKPHOS 56 08/02/2020   AST 21 08/02/2020   ALT 13 08/02/2020   PROT 7.5 08/02/2020   ALBUMIN 4.1 08/02/2020   CALCIUM  9.0 08/08/2020   ANIONGAP 6 08/08/2020   Lab Results  Component Value Date   CHOL 157 06/07/2020   Lab Results  Component Value Date   HDL 51 06/07/2020   Lab Results  Component Value Date   LDLCALC 78 06/07/2020   Lab Results  Component Value Date   TRIG 165 (H) 06/07/2020   Lab Results  Component Value Date   CHOLHDL 3.1 06/07/2020   No results found for: HGBA1C

## 2020-08-10 NOTE — Patient Instructions (Signed)
Stop taking Metoprolol.  

## 2020-08-11 LAB — ANEMIA PROFILE B
Basophils Absolute: 0 10*3/uL (ref 0.0–0.2)
Basos: 0 %
EOS (ABSOLUTE): 0.3 10*3/uL (ref 0.0–0.4)
Eos: 3 %
Ferritin: 196 ng/mL (ref 30–400)
Folate: >20 ng/mL (ref >3.0)
Hematocrit: 42.1 % (ref 37.5–51.0)
Hemoglobin: 14.8 g/dL (ref 13.0–17.7)
Immature Grans (Abs): 0 10*3/uL (ref 0.0–0.1)
Immature Granulocytes: 0 %
Iron Saturation: 29 % (ref 15–55)
Iron: 73 ug/dL (ref 38–169)
Lymphocytes Absolute: 1.8 10*3/uL (ref 0.7–3.1)
Lymphs: 20 %
MCH: 32.9 pg (ref 26.6–33.0)
MCHC: 35.2 g/dL (ref 31.5–35.7)
MCV: 94 fL (ref 79–97)
Monocytes Absolute: 0.7 10*3/uL (ref 0.1–0.9)
Monocytes: 8 %
Neutrophils Absolute: 6.3 10*3/uL (ref 1.4–7.0)
Neutrophils: 69 %
Platelets: 197 10*3/uL (ref 150–450)
RBC: 4.5 x10E6/uL (ref 4.14–5.80)
RDW: 13.4 % (ref 11.6–15.4)
Retic Ct Pct: 1.3 % (ref 0.6–2.6)
Total Iron Binding Capacity: 252 ug/dL (ref 250–450)
UIBC: 179 ug/dL (ref 111–343)
Vitamin B-12: >2000 pg/mL — ABNORMAL HIGH (ref 232–1245)
WBC: 9.1 10*3/uL (ref 3.4–10.8)

## 2020-08-11 LAB — VITAMIN D 25 HYDROXY (VIT D DEFICIENCY, FRACTURES): Vit D, 25-Hydroxy: 18.3 ng/mL — ABNORMAL LOW (ref 30.0–100.0)

## 2020-08-13 ENCOUNTER — Encounter: Payer: Self-pay | Admitting: Family Medicine

## 2020-08-13 ENCOUNTER — Other Ambulatory Visit: Payer: Self-pay | Admitting: Family Medicine

## 2020-08-13 DIAGNOSIS — Z8639 Personal history of other endocrine, nutritional and metabolic disease: Secondary | ICD-10-CM | POA: Insufficient documentation

## 2020-08-13 DIAGNOSIS — R42 Dizziness and giddiness: Secondary | ICD-10-CM | POA: Insufficient documentation

## 2020-08-13 DIAGNOSIS — E559 Vitamin D deficiency, unspecified: Secondary | ICD-10-CM

## 2020-08-13 DIAGNOSIS — N1831 Chronic kidney disease, stage 3a: Secondary | ICD-10-CM | POA: Insufficient documentation

## 2020-08-13 HISTORY — DX: Vitamin D deficiency, unspecified: E55.9

## 2020-08-13 MED ORDER — VITAMIN D (ERGOCALCIFEROL) 1.25 MG (50000 UNIT) PO CAPS: 50000.0000 [IU] | ORAL_CAPSULE | ORAL | 1 refills | Status: DC

## 2020-09-05 ENCOUNTER — Ambulatory Visit (INDEPENDENT_AMBULATORY_CARE_PROVIDER_SITE_OTHER): Payer: Medicare Other

## 2020-09-05 ENCOUNTER — Other Ambulatory Visit: Payer: Self-pay

## 2020-09-05 DIAGNOSIS — Z87442 Personal history of urinary calculi: Secondary | ICD-10-CM

## 2020-09-05 DIAGNOSIS — Z7982 Long term (current) use of aspirin: Secondary | ICD-10-CM

## 2020-09-05 DIAGNOSIS — N2889 Other specified disorders of kidney and ureter: Secondary | ICD-10-CM

## 2020-09-05 DIAGNOSIS — E559 Vitamin D deficiency, unspecified: Secondary | ICD-10-CM

## 2020-09-05 DIAGNOSIS — R42 Dizziness and giddiness: Secondary | ICD-10-CM | POA: Diagnosis not present

## 2020-09-05 DIAGNOSIS — N183 Chronic kidney disease, stage 3 unspecified: Secondary | ICD-10-CM

## 2020-09-05 DIAGNOSIS — E785 Hyperlipidemia, unspecified: Secondary | ICD-10-CM

## 2020-09-05 DIAGNOSIS — I442 Atrioventricular block, complete: Secondary | ICD-10-CM

## 2020-09-05 DIAGNOSIS — I129 Hypertensive chronic kidney disease with stage 1 through stage 4 chronic kidney disease, or unspecified chronic kidney disease: Secondary | ICD-10-CM

## 2020-09-05 DIAGNOSIS — N400 Enlarged prostate without lower urinary tract symptoms: Secondary | ICD-10-CM

## 2020-09-05 DIAGNOSIS — R5383 Other fatigue: Secondary | ICD-10-CM | POA: Diagnosis not present

## 2020-09-05 DIAGNOSIS — Z87891 Personal history of nicotine dependence: Secondary | ICD-10-CM

## 2020-09-05 DIAGNOSIS — M1990 Unspecified osteoarthritis, unspecified site: Secondary | ICD-10-CM

## 2020-09-07 ENCOUNTER — Ambulatory Visit (INDEPENDENT_AMBULATORY_CARE_PROVIDER_SITE_OTHER): Payer: Medicare Other | Admitting: Family Medicine

## 2020-09-07 ENCOUNTER — Encounter: Payer: Self-pay | Admitting: Family Medicine

## 2020-09-07 ENCOUNTER — Other Ambulatory Visit: Payer: Self-pay

## 2020-09-07 VITALS — BP 110/55 | HR 76 | Temp 97.7°F | Ht 68.0 in | Wt 179.4 lb

## 2020-09-07 DIAGNOSIS — R5383 Other fatigue: Secondary | ICD-10-CM

## 2020-09-07 DIAGNOSIS — E559 Vitamin D deficiency, unspecified: Secondary | ICD-10-CM

## 2020-09-07 DIAGNOSIS — Z23 Encounter for immunization: Secondary | ICD-10-CM

## 2020-09-07 DIAGNOSIS — I10 Essential (primary) hypertension: Secondary | ICD-10-CM | POA: Diagnosis not present

## 2020-09-07 MED ORDER — AMLODIPINE BESY-BENAZEPRIL HCL 5-10 MG PO CAPS: 1.0000 | ORAL_CAPSULE | Freq: Every day | ORAL | 2 refills | Status: DC

## 2020-09-07 NOTE — Progress Notes (Signed)
Assessment & Plan:  1. Essential (primary) hypertension - Patient's BP remains on the low end so I am decreasing his Lotrel from 10-20 mg to 5-10 mg.  Patient will continue monitoring his blood pressure at home.  He is wanting to hopefully come off of the medicine altogether, but we discussed that this needs to be a slower process than just stopping it. - amLODipine-benazepril (LOTREL) 5-10 MG capsule; Take 1 capsule by mouth daily.  Dispense: 30 capsule; Refill: 2  2. Fatigue, unspecified type - Patient has some days that are better than others.  Encouraged to continue working with physical therapy and taking his vitamin D supplement to get that back in a normal range.  I offered mirtazapine at bedtime to see if improving sleep and mood could possibly make him feel better, but he declined.  3. Vitamin D deficiency - New diagnosis from last visit.  Encouraged to continue taking vitamin D supplement once weekly.  4. Need for immunization against influenza - Flu Vaccine QUAD High Dose(Fluad)   Return in about 4 weeks (around 10/05/2020) for HTN.  [name], MSN, APRN, FNP-C [facility]  Subjective:    Patient ID: Mitchell Chandler, male    DOB: 1932/10/29, 86 y.o.   MRN: [id number]  Patient Care Team: Loman Brooklyn, FNP as PCP - General (Family Medicine) Leroy Sea, MD as Referring Physician (Urology)   Chief Complaint:  Chief Complaint  Patient presents with  . Hypertension    3 week follow up    HPI: Mitchell Chandler is a 86 y.o. male presenting on 09/07/2020 for Hypertension (3 week follow up)  Patient is accompanied by his son-in-law who he is okay with being present.  At patient's last visit metoprolol was discontinued due to feelings of dizziness and fatigue.  He was also having soft blood pressures.  He reports today the dizziness has resolved.  He reports some days he feels good and other days he feels worn down.  He is continuing to work with  physical therapy and does do his exercises on the days that they do not come.  His blood pressure readings at home average 120s/60s with a heart rate in the 70s.  At his last visit lab work was completed and he was diagnosed with a vitamin D deficiency for which he has been taking vitamin D 50,000 units once daily.  Patient declines having any trouble sleeping or feeling depressed.  He does care for his wife, but does not feel that is stressing him.  Depression screen [facility] 2/9 09/07/2020 09/07/2020 08/10/2020  Decreased Interest 0 0 0  Down, Depressed, Hopeless 0 0 0  PHQ - 2 Score 0 0 0  Altered sleeping 0 - -  Tired, decreased energy 3 - -  Change in appetite 0 - -  Feeling bad or failure about yourself  0 - -  Trouble concentrating 0 - -  Moving slowly or fidgety/restless 0 - -  Suicidal thoughts 0 - -  PHQ-9 Score 3 - -   New complaints: None  Social history:  Relevant past medical, surgical, family and social history reviewed and updated as indicated. Interim medical history since our last visit reviewed.  Allergies and medications reviewed and updated.  DATA REVIEWED: CHART IN EPIC  ROS: Negative unless specifically indicated above in HPI.    Current Outpatient Medications:  .  acetaminophen (TYLENOL) 500 MG tablet, Take 500-1,000 mg by mouth every 6 (six) hours as  needed (for arthritis pain (neck/shoulder)). , Disp: , Rfl:  .  amLODipine-benazepril (LOTREL) 10-20 MG capsule, Take 1 capsule by mouth daily at 12 noon., Disp: 90 capsule, Rfl: 1 .  aspirin 81 MG EC tablet, Take 81 mg by mouth daily. Swallow whole., Disp: , Rfl:  .  doxazosin (CARDURA) 8 MG tablet, Take 1 tablet (8 mg total) by mouth daily with supper., Disp: 90 tablet, Rfl: 1 .  Folic Acid-Vit W2-NFA O13 (FOLBEE) 2.5-25-1 MG TABS tablet, Take 1 tablet by mouth daily at 12 noon., Disp: 90 tablet, Rfl: 1 .  furosemide (LASIX) 20 MG tablet, Take 1 tablet (20 mg total) by mouth daily., Disp: 90 tablet, Rfl: 1 .   meclizine (ANTIVERT) 25 MG tablet, Take 1 tablet (25 mg total) by mouth 3 (three) times daily as needed for dizziness., Disp: 30 tablet, Rfl: 2 .  simvastatin (ZOCOR) 20 MG tablet, Take 1 tablet (20 mg total) by mouth at bedtime., Disp: 90 tablet, Rfl: 1 .  Vitamin D, Ergocalciferol, (DRISDOL) 1.25 MG (50000 UNIT) CAPS capsule, Take 1 capsule (50,000 Units total) by mouth every 7 (seven) days., Disp: 12 capsule, Rfl: 1   No Known Allergies Past Medical History:  Diagnosis Date  . Benign prostatic hyperplasia 12/25/2016  . 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 Red[facility] Pulling  . History of kidney stones   . Hypertension   . 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 Yearly surviellance.   . Renal stone 01/26/2020  . Vitamin D deficiency 08/13/2020    Past Surgical History:  Procedure Laterality Date  . COLONOSCOPY  10/02/2011   Procedure: COLONOSCOPY;  Surgeon: Rogene Houston, MD;  [facility];  Service: Endoscopy;  Laterality: N/A;  10:30 am  . COLONOSCOPY N/A 12/21/2014   Procedure: COLONOSCOPY;  Surgeon: Rogene Houston, MD;  [facility];  Service: Endoscopy;  Laterality: N/A;  1030  . CORONARY ARTERY BYPASS GRAFT  1995  . CYSTOSCOPY W/ URETERAL STENT PLACEMENT Left 01/26/2020   Procedure: CYSTOSCOPY WITH RETROGRADE PYELOGRAM/URETERAL STENT PLACEMENT;  Surgeon: Alexis Frock, MD;  Location[facility];  Service: Urology;  Laterality: Left;  . CYSTOSCOPY WITH RETROGRADE PYELOGRAM, URETEROSCOPY AND STENT PLACEMENT Left 11/19/2019   Procedure: CYSTOSCOPY WITH RETROGRADE PYELOGRAM, DIAGNOSTIC URETEROSCOPY AND STENT PLACEMENT;  Surgeon: Alexis Frock, MD;  [facility];  Service: Urology;  Laterality: Left;  1  . EYE SURGERY     feb 7 feb 28 [place]  . KIDNEY STONE SURGERY    . NEPHROLITHOTOMY Left 01/26/2020   Procedure: NEPHROLITHOTOMY PERCUTANEOUS;  Surgeon:  Alexis Frock, MD;  Location[facility];  Service: Urology;  Laterality: Left;  3 HRS  . PROSTATE BIOPSY      Social History   Socioeconomic History  . Marital status: Married    Spouse name: Not on file  . Number of children: Not on file  . Years of education: Not on file  . Highest education level: Not on file  Occupational History  . Not on file  Tobacco Use  . Smoking status: Former Smoker    Quit date: 12/21/1962    Years since quitting: 57.7  . Smokeless tobacco: Never Used  Substance and Sexual Activity  . Alcohol use: No  . Drug use: No  . Sexual activity: Not on file  Other Topics Concern  . Not on file  Social History  Narrative  . Not on file   Social Determinants of Health   Financial Resource Strain:   . Difficulty of Paying Living Expenses: Not on file  Food Insecurity:   . Worried About [employer] in the Last Year: Not on file  . Ran Out of Food in the Last Year: Not on file  Transportation Needs:   . Lack of Transportation (Medical): Not on file  . Lack of Transportation (Non-Medical): Not on file  Physical Activity:   . Days of Exercise per Week: Not on file  . Minutes of Exercise per Session: Not on file  Stress:   . Feeling of Stress : Not on file  Social Connections:   . Frequency of Communication with Friends and Family: Not on file  . Frequency of Social Gatherings with Friends and Family: Not on file  . Attends Religious Services: Not on file  . Active Member of Clubs or Organizations: Not on file  . Attends Archivist Meetings: Not on file  . Marital Status: Not on file  Intimate Partner Violence:   . Fear of Current or Ex-Partner: Not on file  . Emotionally Abused: Not on file  . Physically Abused: Not on file  . Sexually Abused: Not on file        Objective:    BP (!) 110/55   Pulse 76   Temp 97.7 F (36.5 C) (Temporal)   Ht 5\' 8"  (1.727 m)   Wt 179 lb 6.4 oz (81.4 kg)   SpO2 95%   BMI 27.28 kg/m   Wt  Readings from Last 3 Encounters:  09/07/20 179 lb 6.4 oz (81.4 kg)  08/08/20 180 lb (81.6 kg)  06/07/20 182 lb 12.8 oz (82.9 kg)    Physical Exam Vitals reviewed.  Constitutional:      General: He is not in acute distress.    Appearance: Normal appearance. He is overweight. He is not ill-appearing, toxic-appearing or diaphoretic.  HENT:     Head: Normocephalic and atraumatic.  Eyes:     General: No scleral icterus.       Right eye: No discharge.        Left eye: No discharge.     Conjunctiva/sclera: Conjunctivae normal.  Cardiovascular:     Rate and Rhythm: Normal rate and regular rhythm.     Heart sounds: Normal heart sounds. No murmur heard.  No friction rub. No gallop.   Pulmonary:     Effort: Pulmonary effort is normal. No respiratory distress.     Breath sounds: Normal breath sounds. No stridor. No wheezing, rhonchi or rales.  Musculoskeletal:        General: Normal range of motion.     Cervical back: Normal range of motion.  Skin:    General: Skin is warm and dry.  Neurological:     Mental Status: He is alert and oriented to person, place, and time. Mental status is at baseline.  Psychiatric:        Mood and Affect: Mood normal.        Behavior: Behavior normal.        Thought Content: Thought content normal.        Judgment: Judgment normal.     No results found for: TSH Lab Results  Component Value Date   WBC 9.1 08/10/2020   HGB 14.8 08/10/2020   HCT 42.1 08/10/2020   MCV 94 08/10/2020   PLT 197 08/10/2020   Lab Results  Component Value  Date   NA 135 08/08/2020   K 3.8 08/08/2020   CO2 21 (L) 08/08/2020   GLUCOSE 91 08/08/2020   BUN 14 08/08/2020   CREATININE 0.99 08/08/2020   BILITOT 0.8 08/02/2020   ALKPHOS 56 08/02/2020   AST 21 08/02/2020   ALT 13 08/02/2020   PROT 7.5 08/02/2020   ALBUMIN 4.1 08/02/2020   CALCIUM 9.0 08/08/2020   ANIONGAP 6 08/08/2020   Lab Results  Component Value Date   CHOL 157 06/07/2020   Lab Results  Component  Value Date   HDL 51 06/07/2020   Lab Results  Component Value Date   LDLCALC 78 06/07/2020   Lab Results  Component Value Date   TRIG 165 (H) 06/07/2020   Lab Results  Component Value Date   CHOLHDL 3.1 06/07/2020   No results found for: HGBA1C

## 2020-09-09 ENCOUNTER — Encounter: Payer: Self-pay | Admitting: Family Medicine

## 2020-10-12 ENCOUNTER — Encounter: Payer: Self-pay | Admitting: Nurse Practitioner

## 2020-10-12 ENCOUNTER — Ambulatory Visit (INDEPENDENT_AMBULATORY_CARE_PROVIDER_SITE_OTHER): Payer: Medicare Other | Admitting: Nurse Practitioner

## 2020-10-12 ENCOUNTER — Other Ambulatory Visit: Payer: Self-pay

## 2020-10-12 VITALS — BP 127/57 | HR 81 | Temp 97.2°F | Ht 68.0 in | Wt 182.4 lb

## 2020-10-12 DIAGNOSIS — I10 Essential (primary) hypertension: Secondary | ICD-10-CM

## 2020-10-12 NOTE — Assessment & Plan Note (Signed)
Essential hypertension well managed on current medication no changes to current dose.  Provided education to patient with printed handouts given.  Continue healthy diet and exercise regimen as tolerated.  Follow-up in 3 months.

## 2020-10-12 NOTE — Patient Instructions (Signed)
Blood pressure medication therapeutic, no changes to current dose. Follow up in 3 months  Hypertension, Adult Hypertension is another name for high blood pressure. High blood pressure forces your heart to work harder to pump blood. This can cause problems over time. There are two numbers in a blood pressure reading. There is a top number (systolic) over a bottom number (diastolic). It is best to have a blood pressure that is below 120/80. Healthy choices can help lower your blood pressure, or you may need medicine to help lower it. What are the causes? The cause of this condition is not known. Some conditions may be related to high blood pressure. What increases the risk?  Smoking.  Having type 2 diabetes mellitus, high cholesterol, or both.  Not getting enough exercise or physical activity.  Being overweight.  Having too much fat, sugar, calories, or salt (sodium) in your diet.  Drinking too much alcohol.  Having long-term (chronic) kidney disease.  Having a family history of high blood pressure.  Age. Risk increases with age.  Race. You may be at higher risk if you are African American.  Gender. Men are at higher risk than women before age 71. After age 19, women are at higher risk than men.  Having obstructive sleep apnea.  Stress. What are the signs or symptoms?  High blood pressure may not cause symptoms. Very high blood pressure (hypertensive crisis) may cause: ? Headache. ? Feelings of worry or nervousness (anxiety). ? Shortness of breath. ? Nosebleed. ? A feeling of being sick to your stomach (nausea). ? Throwing up (vomiting). ? Changes in how you see. ? Very bad chest pain. ? Seizures. How is this treated?  This condition is treated by making healthy lifestyle changes, such as: ? Eating healthy foods. ? Exercising more. ? Drinking less alcohol.  Your health care provider may prescribe medicine if lifestyle changes are not enough to get your blood  pressure under control, and if: ? Your top number is above 130. ? Your bottom number is above 80.  Your personal target blood pressure may vary. Follow these instructions at home: Eating and drinking   If told, follow the DASH eating plan. To follow this plan: ? Fill one half of your plate at each meal with fruits and vegetables. ? Fill one fourth of your plate at each meal with whole grains. Whole grains include whole-wheat pasta, brown rice, and whole-grain bread. ? Eat or drink low-fat dairy products, such as skim milk or low-fat yogurt. ? Fill one fourth of your plate at each meal with low-fat (lean) proteins. Low-fat proteins include fish, chicken without skin, eggs, beans, and tofu. ? Avoid fatty meat, cured and processed meat, or chicken with skin. ? Avoid pre-made or processed food.  Eat less than 1,500 mg of salt each day.  Do not drink alcohol if: ? Your doctor tells you not to drink. ? You are pregnant, may be pregnant, or are planning to become pregnant.  If you drink alcohol: ? Limit how much you use to:  0-1 drink a day for women.  0-2 drinks a day for men. ? Be aware of how much alcohol is in your drink. In the [place] one drink equals one 12 oz bottle of beer (355 mL), one 5 oz glass of wine (148 mL), or one 1 oz glass of hard liquor (44 mL). Lifestyle   Work with your doctor to stay at a healthy weight or to lose weight. Ask your doctor what  the best weight is for you.  Get at least 30 minutes of exercise most days of the week. This may include walking, swimming, or biking.  Get at least 30 minutes of exercise that strengthens your muscles (resistance exercise) at least 3 days a week. This may include lifting weights or doing Pilates.  Do not use any products that contain nicotine or tobacco, such as cigarettes, e-cigarettes, and chewing tobacco. If you need help quitting, ask your doctor.  Check your blood pressure at home as told by your doctor.  Keep all  follow-up visits as told by your doctor. This is important. Medicines  Take over-the-counter and prescription medicines only as told by your doctor. Follow directions carefully.  Do not skip doses of blood pressure medicine. The medicine does not work as well if you skip doses. Skipping doses also puts you at risk for problems.  Ask your doctor about side effects or reactions to medicines that you should watch for. Contact a doctor if you:  Think you are having a reaction to the medicine you are taking.  Have headaches that keep coming back (recurring).  Feel dizzy.  Have swelling in your ankles.  Have trouble with your vision. Get help right away if you:  Get a very bad headache.  Start to feel mixed up (confused).  Feel weak or numb.  Feel faint.  Have very bad pain in your: ? Chest. ? Belly (abdomen).  Throw up more than once.  Have trouble breathing. Summary  Hypertension is another name for high blood pressure.  High blood pressure forces your heart to work harder to pump blood.  For most people, a normal blood pressure is less than 120/80.  Making healthy choices can help lower blood pressure. If your blood pressure does not get lower with healthy choices, you may need to take medicine. This information is not intended to replace advice given to you by your health care provider. Make sure you discuss any questions you have with your health care provider. Document Revised: 07/29/2018 Document Reviewed: 07/29/2018 Elsevier Patient Education  2020 [employer].

## 2020-10-12 NOTE — Progress Notes (Signed)
Established Patient Office Visit  Subjective:  Patient ID: Mitchell Chandler, male    DOB: Dec 18, 1931  Age: 86 y.o. MRN: [id number]  CC:  Chief Complaint  Patient presents with   Hypotension    2 week rck on Bp    HPI CAL GINDLESPERGER is a 86 year old patient presents for follow up of hypertension. Patient was diagnosed in _1/24/2018___. The patient is tolerating the medication well without side effects. Compliance with treatment has been good; including taking medication as directed , maintains a healthy diet and regular exercise regimen as tolerated, and following up as directed.  Current medication Lotrel 5-10 mg 1 capsule by mouth daily.   Past Medical History:  Diagnosis Date   Benign prostatic hyperplasia 12/25/2016   Heart block AV complete [facility]) oct 1995   [facility] 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 Yearly surviellance.    Renal stone 01/26/2020   Vitamin D deficiency 08/13/2020    Past Surgical History:  Procedure Laterality Date   COLONOSCOPY  10/02/2011   Procedure: COLONOSCOPY;  Surgeon: Rogene Houston, MD;  [facility];  Service: Endoscopy;  Laterality: N/A;  10:30 am   COLONOSCOPY N/A 12/21/2014   Procedure: COLONOSCOPY;  Surgeon: Rogene Houston, MD;  [facility];  Service: Endoscopy;  Laterality: N/A;  1030   CORONARY ARTERY BYPASS GRAFT  1995   CYSTOSCOPY W/ URETERAL STENT PLACEMENT Left 01/26/2020   Procedure: CYSTOSCOPY WITH RETROGRADE PYELOGRAM/URETERAL STENT PLACEMENT;  Surgeon: Alexis Frock, MD;  [facility]: [facility];  Service: Urology;  Laterality: Left;   CYSTOSCOPY WITH RETROGRADE PYELOGRAM, URETEROSCOPY AND STENT PLACEMENT Left 11/19/2019   Procedure: CYSTOSCOPY WITH RETROGRADE PYELOGRAM, DIAGNOSTIC URETEROSCOPY AND STENT PLACEMENT;  Surgeon: Alexis Frock, MD;  Location[facility];  Service: Urology;  Laterality: Left;  1   EYE SURGERY     feb 7 feb 28 [facility]   KIDNEY STONE SURGERY     NEPHROLITHOTOMY Left 01/26/2020   Procedure: NEPHROLITHOTOMY PERCUTANEOUS;  Surgeon: Alexis Frock, MD;  Location: [facility];  Service: Urology;  Laterality: Left;  3 HRS   PROSTATE BIOPSY      Family History  Problem Relation Age of Onset   Heart disease Mother    Hypertension Father    Hypertension Brother    Hypertension Brother    Hypertension Brother    Hypertension Brother     Social History   Socioeconomic History   Marital status: Married    Spouse name: Not on file   Number of children: Not on file   Years of education: Not on file   Highest education level: Not on file  Occupational History   Not on file  Tobacco Use   Smoking status: Former Smoker    Quit date: 12/21/1962    Years since quitting: 57.8   Smokeless tobacco: Never Used  Scientific laboratory technician Use: Never used  Substance and Sexual Activity   Alcohol use: No   Drug use: No   Sexual activity: Not on file  Other Topics Concern   Not on file  Social History Narrative   Not on file   Social Determinants of Health   Financial Resource Strain:    Difficulty of Paying Living Expenses: Not on file  Food Insecurity:    Worried About  Running Out of Food in the Last Year: Not on file   Ran Out of Food in the Last Year: Not on file  Transportation Needs:    Lack of Transportation (Medical): Not on file   Lack of Transportation (Non-Medical): Not on file  Physical Activity:    Days of Exercise per Week: Not on file   Minutes of Exercise per Session: Not on file  Stress:    Feeling of Stress : Not on file  Social Connections:    Frequency of Communication with Friends and Family: Not on file   Frequency of Social Gatherings with Friends and Family: Not on file   Attends Religious Services: Not on file   Active Member of Clubs or  Organizations: Not on file   Attends Archivist Meetings: Not on file   Marital Status: Not on file  Intimate Partner Violence:    Fear of Current or Ex-Partner: Not on file   Emotionally Abused: Not on file   Physically Abused: Not on file   Sexually Abused: Not on file    Outpatient Medications Prior to Visit  Medication Sig Dispense Refill   acetaminophen (TYLENOL) 500 MG tablet Take 500-1,000 mg by mouth every 6 (six) hours as needed (for arthritis pain (neck/shoulder)).      amLODipine-benazepril (LOTREL) 5-10 MG capsule Take 1 capsule by mouth daily. 30 capsule 2   aspirin 81 MG EC tablet Take 81 mg by mouth daily. Swallow whole.     doxazosin (CARDURA) 8 MG tablet Take 1 tablet (8 mg total) by mouth daily with supper. 90 tablet 1   Folic Acid-Vit T0-WIO X73 (FOLBEE) 2.5-25-1 MG TABS tablet Take 1 tablet by mouth daily at 12 noon. 90 tablet 1   furosemide (LASIX) 20 MG tablet Take 1 tablet (20 mg total) by mouth daily. 90 tablet 1   meclizine (ANTIVERT) 25 MG tablet Take 1 tablet (25 mg total) by mouth 3 (three) times daily as needed for dizziness. 30 tablet 2   simvastatin (ZOCOR) 20 MG tablet Take 1 tablet (20 mg total) by mouth at bedtime. 90 tablet 1   Vitamin D, Ergocalciferol, (DRISDOL) 1.25 MG (50000 UNIT) CAPS capsule Take 1 capsule (50,000 Units total) by mouth every 7 (seven) days. 12 capsule 1   No facility-administered medications prior to visit.    No Known Allergies  ROS Review of Systems  Neurological: Negative for light-headedness and headaches.  All other systems reviewed and are negative.     Objective:    Physical Exam Vitals reviewed.  Constitutional:      Appearance: Normal appearance.  HENT:     Head: Normocephalic.     Nose: Nose normal.  Eyes:     Conjunctiva/sclera: Conjunctivae normal.  Cardiovascular:     Rate and Rhythm: Normal rate and regular rhythm.     Pulses: Normal pulses.     Heart sounds: Normal heart  sounds.  Pulmonary:     Effort: Pulmonary effort is normal.     Breath sounds: Normal breath sounds.  Abdominal:     General: Bowel sounds are normal.  Skin:    General: Skin is warm.  Neurological:     Mental Status: He is alert and oriented to person, place, and time.     BP (!) 127/57    Pulse 81    Temp (!) 97.2 F (36.2 C) (Temporal)    Ht 5\' 8"  (1.727 m)    Wt 182 lb 6.4 oz (82.7 kg)  SpO2 97%    BMI 27.73 kg/m  Wt Readings from Last 3 Encounters:  10/12/20 182 lb 6.4 oz (82.7 kg)  09/07/20 179 lb 6.4 oz (81.4 kg)  08/08/20 180 lb (81.6 kg)      No results found for: TSH Lab Results  Component Value Date   WBC 9.1 08/10/2020   HGB 14.8 08/10/2020   HCT 42.1 08/10/2020   MCV 94 08/10/2020   PLT 197 08/10/2020   Lab Results  Component Value Date   NA 135 08/08/2020   K 3.8 08/08/2020   CO2 21 (L) 08/08/2020   GLUCOSE 91 08/08/2020   BUN 14 08/08/2020   CREATININE 0.99 08/08/2020   BILITOT 0.8 08/02/2020   ALKPHOS 56 08/02/2020   AST 21 08/02/2020   ALT 13 08/02/2020   PROT 7.5 08/02/2020   ALBUMIN 4.1 08/02/2020   CALCIUM 9.0 08/08/2020   ANIONGAP 6 08/08/2020   Lab Results  Component Value Date   CHOL 157 06/07/2020   Lab Results  Component Value Date   HDL 51 06/07/2020   Lab Results  Component Value Date   LDLCALC 78 06/07/2020   Lab Results  Component Value Date   TRIG 165 (H) 06/07/2020   Lab Results  Component Value Date   CHOLHDL 3.1 06/07/2020   No results found for: HGBA1C    Assessment & Plan:   Problem List Items Addressed This Visit      Cardiovascular and Mediastinum   Essential (primary) hypertension - Primary    Essential hypertension well managed on current medication no changes to current dose.  Provided education to patient with printed handouts given.  Continue healthy diet and exercise regimen as tolerated.  Follow-up in 3 months.         No orders of the defined types were placed in this  encounter.   Follow-up: Return in about 3 months (around 01/12/2021).    Ivy Lynn, NP

## 2020-11-13 ENCOUNTER — Encounter: Payer: Self-pay | Admitting: *Deleted

## 2020-12-06 ENCOUNTER — Ambulatory Visit: Payer: Medicare Other | Admitting: Family Medicine

## 2020-12-08 ENCOUNTER — Other Ambulatory Visit: Payer: Self-pay | Admitting: Family Medicine

## 2020-12-08 DIAGNOSIS — I10 Essential (primary) hypertension: Secondary | ICD-10-CM

## 2020-12-21 ENCOUNTER — Ambulatory Visit: Payer: Medicare Other | Admitting: Family Medicine

## 2020-12-25 ENCOUNTER — Telehealth: Payer: Self-pay

## 2020-12-25 ENCOUNTER — Other Ambulatory Visit: Payer: Self-pay | Admitting: Family Medicine

## 2020-12-25 ENCOUNTER — Other Ambulatory Visit: Payer: Self-pay | Admitting: *Deleted

## 2020-12-25 DIAGNOSIS — E785 Hyperlipidemia, unspecified: Secondary | ICD-10-CM

## 2020-12-25 MED ORDER — SIMVASTATIN 20 MG PO TABS: 20.0000 mg | ORAL_TABLET | Freq: Every day | ORAL | 1 refills | Status: DC

## 2020-12-25 NOTE — Telephone Encounter (Signed)
  Prescription Request  12/25/2020  What is the name of the medication or equipment? SIMVASTATIN  Have you contacted your pharmacy to request a refill? (if applicable) YES  Which pharmacy would you like this sent to? [facility]   Patient notified that their request is being sent to the clinical staff for review and that they should receive a response within 2 business days.

## 2021-01-09 ENCOUNTER — Other Ambulatory Visit: Payer: Self-pay | Admitting: Family Medicine

## 2021-01-09 DIAGNOSIS — N401 Enlarged prostate with lower urinary tract symptoms: Secondary | ICD-10-CM

## 2021-01-09 DIAGNOSIS — I10 Essential (primary) hypertension: Secondary | ICD-10-CM

## 2021-01-10 ENCOUNTER — Ambulatory Visit (INDEPENDENT_AMBULATORY_CARE_PROVIDER_SITE_OTHER): Payer: Medicare Other | Admitting: Family Medicine

## 2021-01-10 ENCOUNTER — Other Ambulatory Visit: Payer: Self-pay

## 2021-01-10 ENCOUNTER — Encounter: Payer: Self-pay | Admitting: Family Medicine

## 2021-01-10 VITALS — BP 138/57 | HR 53 | Temp 97.7°F | Ht 68.0 in | Wt 185.2 lb

## 2021-01-10 DIAGNOSIS — N1831 Chronic kidney disease, stage 3a: Secondary | ICD-10-CM

## 2021-01-10 DIAGNOSIS — E785 Hyperlipidemia, unspecified: Secondary | ICD-10-CM | POA: Diagnosis not present

## 2021-01-10 DIAGNOSIS — E559 Vitamin D deficiency, unspecified: Secondary | ICD-10-CM

## 2021-01-10 DIAGNOSIS — I10 Essential (primary) hypertension: Secondary | ICD-10-CM | POA: Diagnosis not present

## 2021-01-10 MED ORDER — AMLODIPINE BESY-BENAZEPRIL HCL 5-10 MG PO CAPS: 1.0000 | ORAL_CAPSULE | Freq: Every day | ORAL | 1 refills | Status: DC

## 2021-01-10 MED ORDER — FUROSEMIDE 20 MG PO TABS: 20.0000 mg | ORAL_TABLET | Freq: Every day | ORAL | 1 refills | Status: DC

## 2021-01-10 NOTE — Progress Notes (Signed)
Assessment & Plan:  1. Essential (primary) hypertension Well controlled on current regimen.  - metoprolol tartrate (LOPRESSOR) 50 MG tablet; Take 50 mg by mouth daily at 2 PM. - amLODipine-benazepril (LOTREL) 5-10 MG capsule; Take 1 capsule by mouth daily.  Dispense: 90 capsule; Refill: 1 - furosemide (LASIX) 20 MG tablet; Take 1 tablet (20 mg total) by mouth daily.  Dispense: 90 tablet; Refill: 1 - CBC with Differential/Platelet - CMP14+EGFR - Lipid panel  2. Dyslipidemia Labs to assess. - CMP14+EGFR - Lipid panel  3. Stage 3a chronic kidney disease ([place]) Labs to assess. - CMP14+EGFR  4. Vitamin D deficiency Labs to assess. - VITAMIN D 25 Hydroxy (Vit-D Deficiency, Fractures)   Return in about 3 months (around 04/09/2021) for follow-up of chronic medication conditions.  [name], MSN, APRN, FNP-C [facility]  Subjective:    Patient ID: Mitchell Chandler, male    DOB: 27-Oct-1932, 87 y.o.   MRN: [id number]  Patient Care Team: Loman Brooklyn, FNP as PCP - General (Family Medicine) Leroy Sea, MD as Referring Physician (Urology)   Chief Complaint:  Chief Complaint  Patient presents with  . Hypertension    6 month follow up of chronic medical conditions    HPI: Mitchell Chandler is a 87 y.o. male presenting on 01/10/2021 for Hypertension (6 month follow up of chronic medical conditions)  Hypertension: Patient reports he put himself back on metoprolol 50 mg once daily at the end of November due to elevated blood pressure readings.  He has kept a daily log of his blood pressure since then and brought it with him today.  His systolic ranges 948-546 with 4/63 >150.  His diastolic ranges 27-03. his heart rate ranges 44-62; he denies any dizziness or lightheadedness.  New complaints: None  Social history:  Relevant past medical, surgical, family and social history reviewed and updated as indicated. Interim medical history since our last visit  reviewed.  Allergies and medications reviewed and updated.  DATA REVIEWED: CHART IN EPIC  ROS: Negative unless specifically indicated above in HPI.    Current Outpatient Medications:  .  acetaminophen (TYLENOL) 500 MG tablet, Take 500-1,000 mg by mouth every 6 (six) hours as needed (for arthritis pain (neck/shoulder)). , Disp: , Rfl:  .  amLODipine-benazepril (LOTREL) 5-10 MG capsule, Take 1 capsule by mouth daily., Disp: 30 capsule, Rfl: 0 .  aspirin 81 MG EC tablet, Take 81 mg by mouth daily. Swallow whole., Disp: , Rfl:  .  doxazosin (CARDURA) 8 MG tablet, TAKE (1) TABLET DAILY WITH SUPPER., Disp: 90 tablet, Rfl: 0 .  FOLBEE 2.5-25-1 MG TABS tablet, TAKE 1 TABLET ONCE DAILY AT NOON, Disp: 90 tablet, Rfl: 0 .  furosemide (LASIX) 20 MG tablet, Take 1 tablet (20 mg total) by mouth daily., Disp: 90 tablet, Rfl: 1 .  metoprolol tartrate (LOPRESSOR) 50 MG tablet, Take 50 mg by mouth daily at 2 PM., Disp: , Rfl:  .  simvastatin (ZOCOR) 20 MG tablet, Take 1 tablet (20 mg total) by mouth at bedtime., Disp: 90 tablet, Rfl: 1 .  Vitamin D, Ergocalciferol, (DRISDOL) 1.25 MG (50000 UNIT) CAPS capsule, Take 1 capsule (50,000 Units total) by mouth every 7 (seven) days., Disp: 12 capsule, Rfl: 1   No Known Allergies Past Medical History:  Diagnosis Date  . Benign prostatic hyperplasia 12/25/2016  . Heart block AV complete ([place]) oct 1995   surgery at [facility] Red[facility] Pulling  . History of kidney stones   .  Hypertension   . 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 Yearly surviellance.   . Renal stone 01/26/2020  . Vitamin D deficiency 08/13/2020    Past Surgical History:  Procedure Laterality Date  . COLONOSCOPY  10/02/2011   Procedure: COLONOSCOPY;  Surgeon: Rogene Houston, MD;  [facility];  Service: Endoscopy;  Laterality: N/A;  10:30 am  . COLONOSCOPY N/A 12/21/2014   Procedure: COLONOSCOPY;  Surgeon: Rogene Houston, MD;   [facility];  Service: Endoscopy;  Laterality: N/A;  1030  . CORONARY ARTERY BYPASS GRAFT  1995  . CYSTOSCOPY W/ URETERAL STENT PLACEMENT Left 01/26/2020   Procedure: CYSTOSCOPY WITH RETROGRADE PYELOGRAM/URETERAL STENT PLACEMENT;  Surgeon: Alexis Frock, MD;  Location[facility];  Service: Urology;  Laterality: Left;  . CYSTOSCOPY WITH RETROGRADE PYELOGRAM, URETEROSCOPY AND STENT PLACEMENT Left 11/19/2019   Procedure: CYSTOSCOPY WITH RETROGRADE PYELOGRAM, DIAGNOSTIC URETEROSCOPY AND STENT PLACEMENT;  Surgeon: Alexis Frock, MD;  [facility];  Service: Urology;  Laterality: Left;  1  . EYE SURGERY     feb 7 feb 28 [place]  . KIDNEY STONE SURGERY    . NEPHROLITHOTOMY Left 01/26/2020   Procedure: NEPHROLITHOTOMY PERCUTANEOUS;  Surgeon: Alexis Frock, MD;  Location[facility];  Service: Urology;  Laterality: Left;  3 HRS  . PROSTATE BIOPSY      Social History   Socioeconomic History  . Marital status: Married    Spouse name: Not on file  . Number of children: Not on file  . Years of education: Not on file  . Highest education level: Not on file  Occupational History  . Not on file  Tobacco Use  . Smoking status: Former Smoker    Quit date: 12/21/1962    Years since quitting: 58.0  . Smokeless tobacco: Never Used  Vaping Use  . Vaping Use: Never used  Substance and Sexual Activity  . Alcohol use: No  . Drug use: No  . Sexual activity: Not on file  Other Topics Concern  . Not on file  Social History Narrative  . Not on file   Social Determinants of Health   Financial Resource Strain: Not on file  Food Insecurity: Not on file  Transportation Needs: Not on file  Physical Activity: Not on file  Stress: Not on file  Social Connections: Not on file  Intimate Partner Violence: Not on file        Objective:    BP (!) 138/57   Pulse (!) 53   Temp 97.7 F (36.5 C) (Temporal)   Ht '5\' 8"'  (1.727 m)   Wt 185 lb 3.2 oz (84 kg)   SpO2 98%    BMI 28.16 kg/m   Wt Readings from Last 3 Encounters:  01/10/21 185 lb 3.2 oz (84 kg)  10/12/20 182 lb 6.4 oz (82.7 kg)  09/07/20 179 lb 6.4 oz (81.4 kg)    Physical Exam Vitals reviewed.  Constitutional:      General: He is not in acute distress.    Appearance: Normal appearance. He is overweight. He is not ill-appearing, toxic-appearing or diaphoretic.  HENT:     Head: Normocephalic and atraumatic.  Eyes:     General: No scleral icterus.       Right eye: No discharge.        Left eye: No discharge.     Conjunctiva/sclera: Conjunctivae normal.  Cardiovascular:  Rate and Rhythm: Normal rate and regular rhythm.     Heart sounds: Normal heart sounds. No murmur heard. No friction rub. No gallop.   Pulmonary:     Effort: Pulmonary effort is normal. No respiratory distress.     Breath sounds: Normal breath sounds. No stridor. No wheezing, rhonchi or rales.  Musculoskeletal:        General: Normal range of motion.     Cervical back: Normal range of motion.  Skin:    General: Skin is warm and dry.  Neurological:     Mental Status: He is alert and oriented to person, place, and time. Mental status is at baseline.  Psychiatric:        Mood and Affect: Mood normal.        Behavior: Behavior normal.        Thought Content: Thought content normal.        Judgment: Judgment normal.     No results found for: TSH Lab Results  Component Value Date   WBC 9.1 08/10/2020   HGB 14.8 08/10/2020   HCT 42.1 08/10/2020   MCV 94 08/10/2020   PLT 197 08/10/2020   Lab Results  Component Value Date   NA 135 08/08/2020   K 3.8 08/08/2020   CO2 21 (L) 08/08/2020   GLUCOSE 91 08/08/2020   BUN 14 08/08/2020   CREATININE 0.99 08/08/2020   BILITOT 0.8 08/02/2020   ALKPHOS 56 08/02/2020   AST 21 08/02/2020   ALT 13 08/02/2020   PROT 7.5 08/02/2020   ALBUMIN 4.1 08/02/2020   CALCIUM 9.0 08/08/2020   ANIONGAP 6 08/08/2020   Lab Results  Component Value Date   CHOL 157 06/07/2020    Lab Results  Component Value Date   HDL 51 06/07/2020   Lab Results  Component Value Date   LDLCALC 78 06/07/2020   Lab Results  Component Value Date   TRIG 165 (H) 06/07/2020   Lab Results  Component Value Date   CHOLHDL 3.1 06/07/2020   No results found for: HGBA1C

## 2021-01-11 ENCOUNTER — Encounter: Payer: Self-pay | Admitting: Family Medicine

## 2021-01-11 LAB — CMP14+EGFR
ALT: 15 IU/L (ref 0–44)
AST: 24 IU/L (ref 0–40)
Albumin/Globulin Ratio: 1.5 (ref 1.2–2.2)
Albumin: 4.4 g/dL (ref 3.6–4.6)
Alkaline Phosphatase: 87 IU/L (ref 44–121)
BUN/Creatinine Ratio: 11 (ref 10–24)
BUN: 12 mg/dL (ref 8–27)
Bilirubin Total: 0.4 mg/dL (ref 0.0–1.2)
CO2: 22 mmol/L (ref 20–29)
Calcium: 9.3 mg/dL (ref 8.6–10.2)
Chloride: 110 mmol/L — ABNORMAL HIGH (ref 96–106)
Creatinine, Ser: 1.07 mg/dL (ref 0.76–1.27)
GFR calc Af Amer: 72 mL/min/{1.73_m2} (ref >59)
GFR calc non Af Amer: 63 mL/min/{1.73_m2} (ref >59)
Globulin, Total: 2.9 g/dL (ref 1.5–4.5)
Glucose: 79 mg/dL (ref 65–99)
Potassium: 4.4 mmol/L (ref 3.5–5.2)
Sodium: 148 mmol/L — ABNORMAL HIGH (ref 134–144)
Total Protein: 7.3 g/dL (ref 6.0–8.5)

## 2021-01-11 LAB — CBC WITH DIFFERENTIAL/PLATELET
Basophils Absolute: 0 10*3/uL (ref 0.0–0.2)
Basos: 0 %
EOS (ABSOLUTE): 0.4 10*3/uL (ref 0.0–0.4)
Eos: 5 %
Hematocrit: 46.2 % (ref 37.5–51.0)
Hemoglobin: 15.9 g/dL (ref 13.0–17.7)
Immature Grans (Abs): 0 10*3/uL (ref 0.0–0.1)
Immature Granulocytes: 0 %
Lymphocytes Absolute: 2.3 10*3/uL (ref 0.7–3.1)
Lymphs: 28 %
MCH: 32 pg (ref 26.6–33.0)
MCHC: 34.4 g/dL (ref 31.5–35.7)
MCV: 93 fL (ref 79–97)
Monocytes Absolute: 0.7 10*3/uL (ref 0.1–0.9)
Monocytes: 8 %
Neutrophils Absolute: 4.9 10*3/uL (ref 1.4–7.0)
Neutrophils: 59 %
Platelets: 215 10*3/uL (ref 150–450)
RBC: 4.97 x10E6/uL (ref 4.14–5.80)
RDW: 12.9 % (ref 11.6–15.4)
WBC: 8.4 10*3/uL (ref 3.4–10.8)

## 2021-01-11 LAB — LIPID PANEL
Chol/HDL Ratio: 2.6 ratio (ref 0.0–5.0)
Cholesterol, Total: 149 mg/dL (ref 100–199)
HDL: 58 mg/dL (ref >39)
LDL Chol Calc (NIH): 72 mg/dL (ref 0–99)
Triglycerides: 108 mg/dL (ref 0–149)
VLDL Cholesterol Cal: 19 mg/dL (ref 5–40)

## 2021-01-11 LAB — VITAMIN D 25 HYDROXY (VIT D DEFICIENCY, FRACTURES): Vit D, 25-Hydroxy: 49.7 ng/mL (ref 30.0–100.0)

## 2021-02-16 ENCOUNTER — Other Ambulatory Visit: Payer: Self-pay | Admitting: Family Medicine

## 2021-02-16 DIAGNOSIS — E559 Vitamin D deficiency, unspecified: Secondary | ICD-10-CM

## 2021-04-04 ENCOUNTER — Other Ambulatory Visit: Payer: Self-pay

## 2021-04-04 ENCOUNTER — Encounter: Payer: Self-pay | Admitting: Family Medicine

## 2021-04-04 ENCOUNTER — Ambulatory Visit (INDEPENDENT_AMBULATORY_CARE_PROVIDER_SITE_OTHER): Payer: Medicare Other | Admitting: Family Medicine

## 2021-04-04 VITALS — BP 134/84 | HR 64 | Temp 98.0°F | Ht 68.0 in | Wt 189.2 lb

## 2021-04-04 DIAGNOSIS — I10 Essential (primary) hypertension: Secondary | ICD-10-CM | POA: Diagnosis not present

## 2021-04-04 DIAGNOSIS — R350 Frequency of micturition: Secondary | ICD-10-CM | POA: Diagnosis not present

## 2021-04-04 DIAGNOSIS — N1831 Chronic kidney disease, stage 3a: Secondary | ICD-10-CM

## 2021-04-04 DIAGNOSIS — N401 Enlarged prostate with lower urinary tract symptoms: Secondary | ICD-10-CM

## 2021-04-04 MED ORDER — DOXAZOSIN MESYLATE 8 MG PO TABS: ORAL_TABLET | ORAL | 1 refills | Status: DC

## 2021-04-04 MED ORDER — FOLBEE 2.5-25-1 MG PO TABS: ORAL_TABLET | ORAL | 1 refills | Status: DC

## 2021-04-04 NOTE — Progress Notes (Signed)
Assessment & Plan:  1. Essential (primary) hypertension Well controlled on current regimen.  - CMP14+EGFR  2. Stage 3a chronic kidney disease (HCC) Previously stable. Labs to assess. - CMP14+EGFR  3. Benign prostatic hyperplasia with urinary frequency Well controlled on current regimen.  - doxazosin (CARDURA) 8 MG tablet; TAKE (1) TABLET DAILY WITH SUPPER.  Dispense: 90 tablet; Refill: 1   Return in about 3 months (around 07/05/2021) for follow-up of chronic medication conditions.  [name], MSN, APRN, FNP-C [facility]  Subjective:    Patient ID: Mitchell Chandler, male    DOB: June 13, 1932, 87 y.o.   MRN: [id number]  Patient Care Team: Loman Brooklyn, FNP as PCP - General (Family Medicine) Leroy Sea, MD as Referring Physician (Urology)   Chief Complaint:  Chief Complaint  Patient presents with  . Hypertension    3 month follow up    HPI: Mitchell Chandler is a 87 y.o. male presenting on 04/04/2021 for Hypertension (3 month follow up)  Hypertension: Patient keeps a log of his blood pressures at home.  His systolic ranges 588-502 with 2/20 >140.  His diastolic ranges 77-41. His heart rate ranges 51-62; he denies any dizziness or lightheadedness.  BPH: patient saw his urologist at the end of March.   CKD: avoiding NSAIDs. Previously has been stable.  New complaints: None  Social history:  Relevant past medical, surgical, family and social history reviewed and updated as indicated. Interim medical history since our last visit reviewed.  Allergies and medications reviewed and updated.  DATA REVIEWED: CHART IN EPIC  ROS: Negative unless specifically indicated above in HPI.    Current Outpatient Medications:  .  acetaminophen (TYLENOL) 500 MG tablet, Take 500-1,000 mg by mouth every 6 (six) hours as needed (for arthritis pain (neck/shoulder)). , Disp: , Rfl:  .  amLODipine-benazepril (LOTREL) 5-10 MG capsule, Take 1 capsule by mouth daily.,  Disp: 90 capsule, Rfl: 1 .  aspirin 81 MG EC tablet, Take 81 mg by mouth daily. Swallow whole., Disp: , Rfl:  .  furosemide (LASIX) 20 MG tablet, Take 1 tablet (20 mg total) by mouth daily., Disp: 90 tablet, Rfl: 1 .  metoprolol tartrate (LOPRESSOR) 50 MG tablet, Take 50 mg by mouth daily at 2 PM., Disp: , Rfl:  .  simvastatin (ZOCOR) 20 MG tablet, Take 1 tablet (20 mg total) by mouth at bedtime., Disp: 90 tablet, Rfl: 1 .  Vitamin D, Ergocalciferol, (DRISDOL) 1.25 MG (50000 UNIT) CAPS capsule, TAKE 1 CAPSULE ONCE A WEEK, Disp: 12 capsule, Rfl: 0 .  doxazosin (CARDURA) 8 MG tablet, TAKE (1) TABLET DAILY WITH SUPPER., Disp: 90 tablet, Rfl: 1 .  Folic Acid-Vit O8-NOM V67 (FOLBEE) 2.5-25-1 MG TABS tablet, TAKE 1 TABLET ONCE DAILY AT NOON, Disp: 90 tablet, Rfl: 1   No Known Allergies Past Medical History:  Diagnosis Date  . Benign prostatic hyperplasia 12/25/2016  . 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 Redmond Pulling  . History of kidney stones   . Hypertension   . 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 Yearly surviellance.   . Renal stone 01/26/2020  . Vitamin D deficiency 08/13/2020    Past Surgical History:  Procedure Laterality Date  . COLONOSCOPY  10/02/2011   Procedure: COLONOSCOPY;  Surgeon: Rogene Houston, MD;  [facility];  Service: Endoscopy;  Laterality: N/A;  10:30 am  .  COLONOSCOPY N/A 12/21/2014   Procedure: COLONOSCOPY;  Surgeon: Rogene Houston, MD;  [facility];  Service: Endoscopy;  Laterality: N/A;  1030  . CORONARY ARTERY BYPASS GRAFT  1995  . CYSTOSCOPY W/ URETERAL STENT PLACEMENT Left 01/26/2020   Procedure: CYSTOSCOPY WITH RETROGRADE PYELOGRAM/URETERAL STENT PLACEMENT;  Surgeon: Alexis Frock, MD;  Location: [facility];  Service: Urology;  Laterality: Left;  . CYSTOSCOPY WITH RETROGRADE PYELOGRAM, URETEROSCOPY AND STENT PLACEMENT Left 11/19/2019   Procedure:  CYSTOSCOPY WITH RETROGRADE PYELOGRAM, DIAGNOSTIC URETEROSCOPY AND STENT PLACEMENT;  Surgeon: Alexis Frock, MD;  Location: [facility];  Service: Urology;  Laterality: Left;  1  . EYE SURGERY     feb 7 feb 28 [place]  . KIDNEY STONE SURGERY    . NEPHROLITHOTOMY Left 01/26/2020   Procedure: NEPHROLITHOTOMY PERCUTANEOUS;  Surgeon: Alexis Frock, MD;  Location: [facility];  Service: Urology;  Laterality: Left;  3 HRS  . PROSTATE BIOPSY      Social History   Socioeconomic History  . Marital status: Married    Spouse name: Not on file  . Number of children: Not on file  . Years of education: Not on file  . Highest education level: Not on file  Occupational History  . Not on file  Tobacco Use  . Smoking status: Former Smoker    Quit date: 12/21/1962    Years since quitting: 58.3  . Smokeless tobacco: Never Used  Vaping Use  . Vaping Use: Never used  Substance and Sexual Activity  . Alcohol use: No  . Drug use: No  . Sexual activity: Not on file  Other Topics Concern  . Not on file  Social History Narrative  . Not on file   Social Determinants of Health   Financial Resource Strain: Not on file  Food Insecurity: Not on file  Transportation Needs: Not on file  Physical Activity: Not on file  Stress: Not on file  Social Connections: Not on file  Intimate Partner Violence: Not on file        Objective:    BP 134/84   Pulse 64   Temp 98 F (36.7 C) (Temporal)   Ht '5\' 8"'  (1.727 m)   Wt 189 lb 3.2 oz (85.8 kg)   SpO2 94%   BMI 28.77 kg/m   Wt Readings from Last 3 Encounters:  04/04/21 189 lb 3.2 oz (85.8 kg)  01/10/21 185 lb 3.2 oz (84 kg)  10/12/20 182 lb 6.4 oz (82.7 kg)   Physical Exam Vitals reviewed.  Constitutional:      General: He is not in acute distress.    Appearance: Normal appearance. He is overweight. He is not ill-appearing, toxic-appearing or diaphoretic.  HENT:     Head: Normocephalic and atraumatic.  Eyes:     General: No  scleral icterus.       Right eye: No discharge.        Left eye: No discharge.     Conjunctiva/sclera: Conjunctivae normal.  Cardiovascular:     Rate and Rhythm: Normal rate and regular rhythm.     Heart sounds: Normal heart sounds. No murmur heard. No friction rub. No gallop.   Pulmonary:     Effort: Pulmonary effort is normal. No respiratory distress.     Breath sounds: Normal breath sounds. No stridor. No wheezing, rhonchi or rales.  Musculoskeletal:        General: Normal range of motion.     Cervical back: Normal range of motion.  Skin:    General: Skin is warm and dry.  Neurological:     Mental Status: He is alert and oriented to person, place, and time. Mental status is at baseline.  Psychiatric:        Mood and Affect: Mood normal.        Behavior: Behavior normal.        Thought Content: Thought content normal.        Judgment: Judgment normal.     No results found for: TSH Lab Results  Component Value Date   WBC 8.4 01/10/2021   HGB 15.9 01/10/2021   HCT 46.2 01/10/2021   MCV 93 01/10/2021   PLT 215 01/10/2021   Lab Results  Component Value Date   NA 148 (H) 01/10/2021   K 4.4 01/10/2021   CO2 22 01/10/2021   GLUCOSE 79 01/10/2021   BUN 12 01/10/2021   CREATININE 1.07 01/10/2021   BILITOT 0.4 01/10/2021   ALKPHOS 87 01/10/2021   AST 24 01/10/2021   ALT 15 01/10/2021   PROT 7.3 01/10/2021   ALBUMIN 4.4 01/10/2021   CALCIUM 9.3 01/10/2021   ANIONGAP 6 08/08/2020   Lab Results  Component Value Date   CHOL 149 01/10/2021   Lab Results  Component Value Date   HDL 58 01/10/2021   Lab Results  Component Value Date   LDLCALC 72 01/10/2021   Lab Results  Component Value Date   TRIG 108 01/10/2021   Lab Results  Component Value Date   CHOLHDL 2.6 01/10/2021   No results found for: HGBA1C

## 2021-04-05 LAB — CMP14+EGFR
ALT: 12 IU/L (ref 0–44)
AST: 18 IU/L (ref 0–40)
Albumin/Globulin Ratio: 1.5 (ref 1.2–2.2)
Albumin: 4.3 g/dL (ref 3.6–4.6)
Alkaline Phosphatase: 80 IU/L (ref 44–121)
BUN/Creatinine Ratio: 15 (ref 10–24)
BUN: 16 mg/dL (ref 8–27)
Bilirubin Total: 0.5 mg/dL (ref 0.0–1.2)
CO2: 21 mmol/L (ref 20–29)
Calcium: 9.6 mg/dL (ref 8.6–10.2)
Chloride: 106 mmol/L (ref 96–106)
Creatinine, Ser: 1.1 mg/dL (ref 0.76–1.27)
Globulin, Total: 2.9 g/dL (ref 1.5–4.5)
Glucose: 112 mg/dL — ABNORMAL HIGH (ref 65–99)
Potassium: 4.6 mmol/L (ref 3.5–5.2)
Sodium: 142 mmol/L (ref 134–144)
Total Protein: 7.2 g/dL (ref 6.0–8.5)
eGFR: 65 mL/min/{1.73_m2} (ref >59)

## 2021-04-09 ENCOUNTER — Other Ambulatory Visit: Payer: Self-pay | Admitting: Family Medicine

## 2021-04-09 DIAGNOSIS — N401 Enlarged prostate with lower urinary tract symptoms: Secondary | ICD-10-CM

## 2021-04-09 DIAGNOSIS — E785 Hyperlipidemia, unspecified: Secondary | ICD-10-CM

## 2021-05-01 ENCOUNTER — Ambulatory Visit: Payer: Medicare Other | Admitting: *Deleted

## 2021-07-05 ENCOUNTER — Other Ambulatory Visit: Payer: Self-pay

## 2021-07-05 ENCOUNTER — Encounter: Payer: Self-pay | Admitting: Family Medicine

## 2021-07-05 ENCOUNTER — Other Ambulatory Visit: Payer: Self-pay | Admitting: Family Medicine

## 2021-07-05 ENCOUNTER — Ambulatory Visit (INDEPENDENT_AMBULATORY_CARE_PROVIDER_SITE_OTHER): Payer: Medicare Other | Admitting: Family Medicine

## 2021-07-05 VITALS — BP 130/58 | HR 59 | Temp 96.4°F | Ht 68.0 in | Wt 186.2 lb

## 2021-07-05 DIAGNOSIS — E559 Vitamin D deficiency, unspecified: Secondary | ICD-10-CM

## 2021-07-05 DIAGNOSIS — N401 Enlarged prostate with lower urinary tract symptoms: Secondary | ICD-10-CM

## 2021-07-05 DIAGNOSIS — N1831 Chronic kidney disease, stage 3a: Secondary | ICD-10-CM | POA: Diagnosis not present

## 2021-07-05 DIAGNOSIS — Z9189 Other specified personal risk factors, not elsewhere classified: Secondary | ICD-10-CM

## 2021-07-05 DIAGNOSIS — I10 Essential (primary) hypertension: Secondary | ICD-10-CM | POA: Diagnosis not present

## 2021-07-05 DIAGNOSIS — E785 Hyperlipidemia, unspecified: Secondary | ICD-10-CM

## 2021-07-05 DIAGNOSIS — Z1159 Encounter for screening for other viral diseases: Secondary | ICD-10-CM

## 2021-07-05 DIAGNOSIS — M1990 Unspecified osteoarthritis, unspecified site: Secondary | ICD-10-CM

## 2021-07-05 DIAGNOSIS — R350 Frequency of micturition: Secondary | ICD-10-CM

## 2021-07-05 MED ORDER — SIMVASTATIN 20 MG PO TABS: 20.0000 mg | ORAL_TABLET | Freq: Every day | ORAL | 1 refills | Status: DC

## 2021-07-05 MED ORDER — FUROSEMIDE 20 MG PO TABS: 20.0000 mg | ORAL_TABLET | Freq: Every day | ORAL | 1 refills | Status: DC

## 2021-07-05 MED ORDER — FOLBEE 2.5-25-1 MG PO TABS: ORAL_TABLET | ORAL | 1 refills | Status: DC

## 2021-07-05 MED ORDER — DOXAZOSIN MESYLATE 8 MG PO TABS: ORAL_TABLET | ORAL | 1 refills | Status: DC

## 2021-07-05 NOTE — Progress Notes (Signed)
Assessment & Plan:  1. Essential (primary) hypertension - well managed on current regimen - furosemide (LASIX) 20 MG tablet; Take 1 tablet (20 mg total) by mouth daily.  Dispense: 90 tablet; Refill: 1 - CBC with Differential/Platelet - CMP14+EGFR - Lipid panel - Encouraged to continue to record blood pressures at home  2. Stage 3a chronic kidney disease ([place]) - CMP14+EGFR  3. Dyslipidemia - well managed on current regimen - simvastatin (ZOCOR) 20 MG tablet; Take 1 tablet (20 mg total) by mouth at bedtime.  Dispense: 90 tablet; Refill: 1 - CMP14+EGFR - Lipid panel  4. Benign prostatic hyperplasia with urinary frequency - well managed on current regimen - doxazosin (CARDURA) 8 MG tablet; 1 tablet daily  Dispense: 90 tablet; Refill: 1 - managed by urology  5. Arthritis - well managed on current regimen - CMP14+EGFR  6. Vitamin D deficiency - corrected, will reduce to 1000 units daily if Vit D level normal - VITAMIN D 25 Hydroxy (Vit-D Deficiency, Fractures)  7. High risk for hip fracture - FRAX: 8.9% risk of hip fracture - DEXA recommended, unable to get insurance coverage  8. Encounter for hepatitis C screening test for low risk patient - Hepatitis C antibody (reflex, frozen specimen)   Offered referral to physical therapy due to patient's complaint of feeling like there are rocks in his pockets when he stands, declined at this time.  Follow-up: Return in about 6 months (around 01/05/2022) for follow-up of chronic medication conditions.   Lucile Crater, NP Student  Subjective:  Patient ID: Mitchell Chandler, male    DOB: Jul 06, 1932  Age: 87 y.o. MRN: [id number]  Patient Care Team: Loman Brooklyn, FNP as PCP - General (Family Medicine) Leroy Sea, MD as Referring Physician (Urology)   CC:  Chief Complaint  Patient presents with   Hypertension   Chronic Kidney Disease    3 month follow up of chronic medical conditions     HPI Mitchell Chandler presents for follow  up for chronic medical conditions.   Occupation: retired, Marital status: married, Substance use: no Diet: regular, Exercise: minimal Last eye exam: 08/2019 Last dental exam: has been a while but is established  FRAX: 12% risk of major osteoporotic fracture, 8. 9% risk of hip fracture DEXA: recommended  Hepatitis C Screening: due PSA: 06/2020 Immunizations:  Flu Vaccine: will get Tdap Vaccine: declined  Shingrix Vaccine: declined  COVID-19 Vaccine: declined Pneumonia Vaccine: up to date   DEPRESSION SCREENING PHQ 2/9 Scores 04/04/2021 01/10/2021 09/07/2020 09/07/2020 08/10/2020 06/07/2020 12/08/2019  PHQ - 2 Score 0 0 0 0 0 0 0  PHQ- 9 Score - - 3 - - - -     Review of Systems  Constitutional: Negative.   HENT: Negative.    Eyes: Negative.   Respiratory: Negative.    Cardiovascular: Negative.   Gastrointestinal: Negative.   Genitourinary: Negative.   Musculoskeletal: Negative.   Skin: Negative.   Neurological: Negative.   Endo/Heme/Allergies: Negative.   Psychiatric/Behavioral: Negative.      Current Outpatient Medications:    acetaminophen (TYLENOL) 500 MG tablet, Take 500-1,000 mg by mouth every 6 (six) hours as needed (for arthritis pain (neck/shoulder)). , Disp: , Rfl:    amLODipine-benazepril (LOTREL) 5-10 MG capsule, Take 1 capsule by mouth daily., Disp: 90 capsule, Rfl: 1   aspirin 81 MG EC tablet, Take 81 mg by mouth daily. Swallow whole., Disp: , Rfl:    doxazosin (CARDURA) 8 MG tablet, TAKE (1) TABLET DAILY WITH SUPPER.,  Disp: 90 tablet, Rfl: 0   FOLBEE 2.5-25-1 MG TABS tablet, TAKE 1 TABLET ONCE DAILY AT NOON, Disp: 90 tablet, Rfl: 0   furosemide (LASIX) 20 MG tablet, Take 1 tablet (20 mg total) by mouth daily., Disp: 90 tablet, Rfl: 1   metoprolol tartrate (LOPRESSOR) 50 MG tablet, Take 50 mg by mouth daily at 2 PM., Disp: , Rfl:    simvastatin (ZOCOR) 20 MG tablet, Take 1 tablet at bedtime., Disp: 90 tablet, Rfl: 0   Vitamin D, Ergocalciferol, (DRISDOL) 1.25 MG (50000  UNIT) CAPS capsule, TAKE 1 CAPSULE ONCE A WEEK, Disp: 12 capsule, Rfl: 0  No Known Allergies  Past Medical History:  Diagnosis Date   Benign prostatic hyperplasia 12/25/2016   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 Yearly surviellance.    Renal stone 01/26/2020   Vitamin D deficiency 08/13/2020    Past Surgical History:  Procedure Laterality Date   COLONOSCOPY  10/02/2011   Procedure: COLONOSCOPY;  Surgeon: Rogene Houston, MD;  [facility];  Service: Endoscopy;  Laterality: N/A;  10:30 am   COLONOSCOPY N/A 12/21/2014   Procedure: COLONOSCOPY;  Surgeon: Rogene Houston, MD;  [facility];  Service: Endoscopy;  Laterality: N/A;  1030   CORONARY ARTERY BYPASS GRAFT  1995   CYSTOSCOPY W/ URETERAL STENT PLACEMENT Left 01/26/2020   Procedure: CYSTOSCOPY WITH RETROGRADE PYELOGRAM/URETERAL STENT PLACEMENT;  Surgeon: Alexis Frock, MD;  [facility];  Service: Urology;  Laterality: Left;   CYSTOSCOPY WITH RETROGRADE PYELOGRAM, URETEROSCOPY AND STENT PLACEMENT Left 11/19/2019   Procedure: CYSTOSCOPY WITH RETROGRADE PYELOGRAM, DIAGNOSTIC URETEROSCOPY AND STENT PLACEMENT;  Surgeon: Alexis Frock, MD;  Location: [facility];  Service: Urology;  Laterality: Left;  1   EYE SURGERY     feb 7 feb 28 [place]   KIDNEY STONE SURGERY     NEPHROLITHOTOMY Left 01/26/2020   Procedure: NEPHROLITHOTOMY PERCUTANEOUS;  Surgeon: Alexis Frock, MD;  [facility];  Service: Urology;  Laterality: Left;  3 HRS   PROSTATE BIOPSY      Family History  Problem Relation Age of Onset   Heart disease Mother    Hypertension Father    Hypertension Brother    Hypertension Brother    Hypertension Brother    Hypertension Brother     Social History   Socioeconomic History   Marital  status: Married    Spouse name: Not on file   Number of children: Not on file   Years of education: Not on file   Highest education level: Not on file  Occupational History   Not on file  Tobacco Use   Smoking status: Former    Types: Cigarettes    Quit date: 12/21/1962    Years since quitting: 58.5   Smokeless tobacco: Never  Vaping Use   Vaping Use: Never used  Substance and Sexual Activity   Alcohol use: No   Drug use: No   Sexual activity: Not on file  Other Topics Concern   Not on file  Social History Narrative   Not on file   Social Determinants of Health   Financial Resource Strain: Not on file  Food Insecurity: Not on file  Transportation Needs: Not on file  Physical Activity: Not on file  Stress: Not on file  Social Connections: Not on file  Intimate Partner Violence: Not on file      Objective:    BP (!) 130/58   Pulse (!) 59   Temp (!) 96.4 F (35.8 C) (Temporal)   Ht _0  (1.727 m)   Wt 84.5 kg   SpO2 96%   BMI 28.31 kg/m   Wt Readings from Last 3 Encounters:  04/04/21 85.8 kg  01/10/21 84 kg  10/12/20 82.7 kg    Physical Exam Vitals reviewed.  Constitutional:      Appearance: Normal appearance. He is normal weight.  HENT:     Head: Normocephalic.     Right Ear: Tympanic membrane, ear canal and external ear normal.     Left Ear: Tympanic membrane, ear canal and external ear normal.     Nose: Nose normal.     Mouth/Throat:     Mouth: Mucous membranes are moist.     Pharynx: Oropharynx is clear.  Eyes:     Extraocular Movements: Extraocular movements intact.     Conjunctiva/sclera: Conjunctivae normal.     Pupils: Pupils are equal, round, and reactive to light.  Cardiovascular:     Rate and Rhythm: Normal rate and regular rhythm.     Pulses: Normal pulses.     Heart sounds: Normal heart sounds.  Pulmonary:     Effort: Pulmonary effort is normal.     Breath sounds: Normal breath sounds.  Abdominal:     General: Bowel sounds are  normal.     Palpations: Abdomen is soft.  Musculoskeletal:        General: Normal range of motion.     Cervical back: Normal range of motion.     Comments: Pt states that occasionally when he stands he feels like his "pockets are full of rocks"  Skin:    General: Skin is warm and dry.  Neurological:     General: No focal deficit present.     Mental Status: He is alert and oriented to person, place, and time. Mental status is at baseline.    No results found for: TSH Lab Results  Component Value Date   WBC 8.4 01/10/2021   HGB 15.9 01/10/2021   HCT 46.2 01/10/2021   MCV 93 01/10/2021   PLT 215 01/10/2021   Lab Results  Component Value Date   NA 142 04/04/2021   K 4.6 04/04/2021   CO2 21 04/04/2021   GLUCOSE 112 (H) 04/04/2021   BUN 16 04/04/2021   CREATININE 1.10 04/04/2021   BILITOT 0.5 04/04/2021   ALKPHOS 80 04/04/2021   AST 18 04/04/2021   ALT 12 04/04/2021   PROT 7.2 04/04/2021   ALBUMIN 4.3 04/04/2021   CALCIUM 9.6 04/04/2021   ANIONGAP 6 08/08/2020   EGFR 65 04/04/2021   Lab Results  Component Value Date   CHOL 149 01/10/2021   Lab Results  Component Value Date   HDL 58 01/10/2021   Lab Results  Component Value Date   LDLCALC 72 01/10/2021   Lab Results  Component Value Date   TRIG 108 01/10/2021   Lab Results  Component Value Date   CHOLHDL 2.6 01/10/2021   No results found for: HGBA1C

## 2021-07-13 ENCOUNTER — Ambulatory Visit (INDEPENDENT_AMBULATORY_CARE_PROVIDER_SITE_OTHER): Payer: Medicare Other

## 2021-07-13 VITALS — BP 134/55 | HR 52 | Ht 68.0 in | Wt 186.0 lb

## 2021-07-13 DIAGNOSIS — Z0000 Encounter for general adult medical examination without abnormal findings: Secondary | ICD-10-CM | POA: Diagnosis not present

## 2021-07-13 NOTE — Patient Instructions (Signed)
Mr. Mitchell Chandler , Thank you for taking time to come for your Medicare Wellness Visit. I appreciate your ongoing commitment to your health goals. Please review the following plan we discussed and let me know if I can assist you in the future.   Screening recommendations/referrals: Colonoscopy: Done 12/21/2014 - No longer required Recommended yearly ophthalmology/optometry visit for glaucoma screening and checkup Recommended yearly dental visit for hygiene and checkup  Vaccinations: Influenza vaccine: Done 09/07/2020 - Repeat annually  Pneumococcal vaccine: Done 09/02/2007 & 09/01/2016 Tdap vaccine: Due. Every 10 years Shingles vaccine: Due. Shingrix discussed. Please contact your pharmacy for coverage information.     Covid-19: Done 01/05/20, 02/02/20, & 10/03/2020  Advanced directives: Please bring a copy of your health care power of attorney and living will to the office to be added to your chart at your convenience.   Conditions/risks identified: Aim for 30 minutes of exercise or brisk walking each day, drink 6-8 glasses of water and eat lots of fruits and vegetables.  Next appointment: Follow up in one year for your annual wellness visit.   Preventive Care 85 Years and Older, Male  Preventive care refers to lifestyle choices and visits with your health care provider that can promote health and wellness. What does preventive care include? A yearly physical exam. This is also called an annual well check. Dental exams once or twice a year. Routine eye exams. Ask your health care provider how often you should have your eyes checked. Personal lifestyle choices, including: Daily care of your teeth and gums. Regular physical activity. Eating a healthy diet. Avoiding tobacco and drug use. Limiting alcohol use. Practicing safe sex. Taking low doses of aspirin every day. Taking vitamin and mineral supplements as recommended by your health care provider. What happens during an annual well check? The  services and screenings done by your health care provider during your annual well check will depend on your age, overall health, lifestyle risk factors, and family history of disease. Counseling  Your health care provider may ask you questions about your: Alcohol use. Tobacco use. Drug use. Emotional well-being. Home and relationship well-being. Sexual activity. Eating habits. History of falls. Memory and ability to understand (cognition). Work and work Statistician. Screening  You may have the following tests or measurements: Height, weight, and BMI. Blood pressure. Lipid and cholesterol levels. These may be checked every 5 years, or more frequently if you are over 78 years old. Skin check. Lung cancer screening. You may have this screening every year starting at age 40 if you have a 30-pack-year history of smoking and currently smoke or have quit within the past 15 years. Fecal occult blood test (FOBT) of the stool. You may have this test every year starting at age 72. Flexible sigmoidoscopy or colonoscopy. You may have a sigmoidoscopy every 5 years or a colonoscopy every 10 years starting at age 37. Prostate cancer screening. Recommendations will vary depending on your family history and other risks. Hepatitis C blood test. Hepatitis B blood test. Sexually transmitted disease (STD) testing. Diabetes screening. This is done by checking your blood sugar (glucose) after you have not eaten for a while (fasting). You may have this done every 1-3 years. Abdominal aortic aneurysm (AAA) screening. You may need this if you are a current or former smoker. Osteoporosis. You may be screened starting at age 90 if you are at high risk. Talk with your health care provider about your test results, treatment options, and if necessary, the need for more tests.  Vaccines  Your health care provider may recommend certain vaccines, such as: Influenza vaccine. This is recommended every year. Tetanus,  diphtheria, and acellular pertussis (Tdap, Td) vaccine. You may need a Td booster every 10 years. Zoster vaccine. You may need this after age 57. Pneumococcal 13-valent conjugate (PCV13) vaccine. One dose is recommended after age 36. Pneumococcal polysaccharide (PPSV23) vaccine. One dose is recommended after age 59. Talk to your health care provider about which screenings and vaccines you need and how often you need them. This information is not intended to replace advice given to you by your health care provider. Make sure you discuss any questions you have with your health care provider. Document Released: 12/15/2015 Document Revised: 08/07/2016 Document Reviewed: 09/19/2015 Elsevier Interactive Patient Education  2017 [place] Prevention in the Home Falls can cause injuries. They can happen to people of all ages. There are many things you can do to make your home safe and to help prevent falls. What can I do on the outside of my home? Regularly fix the edges of walkways and driveways and fix any cracks. Remove anything that might make you trip as you walk through a door, such as a raised step or threshold. Trim any bushes or trees on the path to your home. Use bright outdoor lighting. Clear any walking paths of anything that might make someone trip, such as rocks or tools. Regularly check to see if handrails are loose or broken. Make sure that both sides of any steps have handrails. Any raised decks and porches should have guardrails on the edges. Have any leaves, snow, or ice cleared regularly. Use sand or salt on walking paths during winter. Clean up any spills in your garage right away. This includes oil or grease spills. What can I do in the bathroom? Use night lights. Install grab bars by the toilet and in the tub and shower. Do not use towel bars as grab bars. Use non-skid mats or decals in the tub or shower. If you need to sit down in the shower, use a plastic,  non-slip stool. Keep the floor dry. Clean up any water that spills on the floor as soon as it happens. Remove soap buildup in the tub or shower regularly. Attach bath mats securely with double-sided non-slip rug tape. Do not have throw rugs and other things on the floor that can make you trip. What can I do in the bedroom? Use night lights. Make sure that you have a light by your bed that is easy to reach. Do not use any sheets or blankets that are too big for your bed. They should not hang down onto the floor. Have a firm chair that has side arms. You can use this for support while you get dressed. Do not have throw rugs and other things on the floor that can make you trip. What can I do in the kitchen? Clean up any spills right away. Avoid walking on wet floors. Keep items that you use a lot in easy-to-reach places. If you need to reach something above you, use a strong step stool that has a grab bar. Keep electrical cords out of the way. Do not use floor polish or wax that makes floors slippery. If you must use wax, use non-skid floor wax. Do not have throw rugs and other things on the floor that can make you trip. What can I do with my stairs? Do not leave any items on the stairs. Make sure that  there are handrails on both sides of the stairs and use them. Fix handrails that are broken or loose. Make sure that handrails are as long as the stairways. Check any carpeting to make sure that it is firmly attached to the stairs. Fix any carpet that is loose or worn. Avoid having throw rugs at the top or bottom of the stairs. If you do have throw rugs, attach them to the floor with carpet tape. Make sure that you have a light switch at the top of the stairs and the bottom of the stairs. If you do not have them, ask someone to add them for you. What else can I do to help prevent falls? Wear shoes that: Do not have high heels. Have rubber bottoms. Are comfortable and fit you well. Are closed  at the toe. Do not wear sandals. If you use a stepladder: Make sure that it is fully opened. Do not climb a closed stepladder. Make sure that both sides of the stepladder are locked into place. Ask someone to hold it for you, if possible. Clearly mark and make sure that you can see: Any grab bars or handrails. First and last steps. Where the edge of each step is. Use tools that help you move around (mobility aids) if they are needed. These include: Canes. Walkers. Scooters. Crutches. Turn on the lights when you go into a dark area. Replace any light bulbs as soon as they burn out. Set up your furniture so you have a clear path. Avoid moving your furniture around. If any of your floors are uneven, fix them. If there are any pets around you, be aware of where they are. Review your medicines with your doctor. Some medicines can make you feel dizzy. This can increase your chance of falling. Ask your doctor what other things that you can do to help prevent falls. This information is not intended to replace advice given to you by your health care provider. Make sure you discuss any questions you have with your health care provider. Document Released: 09/14/2009 Document Revised: 04/25/2016 Document Reviewed: 12/23/2014 Elsevier Interactive Patient Education  2017 [employer].

## 2021-07-13 NOTE — Progress Notes (Signed)
Subjective:   Mitchell Chandler is a 87 y.o. male who presents for Medicare Annual/Subsequent preventive examination.  Virtual Visit via Telephone Note  I connected with  Mitchell Chandler on 07/13/21 at  4:15 PM EDT by telephone and verified that I am speaking with the correct person using two identifiers.  Location: Patient: Home Provider: [facility] Persons participating in the virtual visit: patient/Nurse Health Advisor   I discussed the limitations, risks, security and privacy concerns of performing an evaluation and management service by telephone and the availability of in person appointments. The patient expressed understanding and agreed to proceed.  Interactive audio and video telecommunications were attempted between this nurse and patient, however failed, due to patient having technical difficulties OR patient did not have access to video capability.  We continued and completed visit with audio only.  Some vital signs may be absent or patient reported.   Mitchell Haubner E Hanish Laraia, LPN   Review of Systems     Cardiac Risk Factors include: advanced age (>45mn, >>62women);dyslipidemia;male gender;hypertension     Objective:    Today's Vitals   07/13/21 1639  BP: (!) 134/55  Pulse: (!) 52  Weight: 186 lb (84.4 kg)  Height: '5\' 8"'$  (1.727 m)   Body mass index is 28.28 kg/m.  Advanced Directives 07/13/2021 08/08/2020 08/02/2020 01/26/2020 01/26/2020 01/21/2020 11/19/2019  Does Patient Have a Medical Advance Directive? Yes No No No No No No  Type of [facility] will - - - - - -  [place] of [place]? No - copy requested - - - - - -  Would patient like information on creating a medical advance directive? - - Yes (ED - Information included in AVS) No - Patient declined No - Patient declined No - Patient declined No - Guardian declined  Pre-existing out of facility DNR order (yellow form or pink MOST form) - - - - - - -    Current  Medications (verified) Outpatient Encounter Medications as of 07/13/2021  Medication Sig   acetaminophen (TYLENOL) 500 MG tablet Take 500-1,000 mg by mouth every 6 (six) hours as needed (for arthritis pain (neck/shoulder)).    amLODipine-benazepril (LOTREL) 5-10 MG capsule Take 1 capsule by mouth daily.   aspirin 81 MG EC tablet Take 81 mg by mouth daily. Swallow whole.   doxazosin (CARDURA) 8 MG tablet 1 tablet daily   Folic Acid-Vit [id number](FOLBEE) 2.5-25-1 MG TABS tablet 1 tablet daily   furosemide (LASIX) 20 MG tablet Take 1 tablet (20 mg total) by mouth daily.   metoprolol tartrate (LOPRESSOR) 50 MG tablet Take 50 mg by mouth daily at 2 PM.   simvastatin (ZOCOR) 20 MG tablet Take 1 tablet (20 mg total) by mouth at bedtime.   Vitamin D, Ergocalciferol, (DRISDOL) 1.25 MG (50000 UNIT) CAPS capsule TAKE ONE CAPSULE BY MOUTH ONCE APPLY WEEK   No facility-administered encounter medications on file as of 07/13/2021.    Allergies (verified) Patient has no known allergies.   History: Past Medical History:  Diagnosis Date   Benign prostatic hyperplasia 12/25/2016   [place]   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 Yearly surviellance.    Renal stone 01/26/2020   Vitamin D deficiency 08/13/2020   Past Surgical  History:  Procedure Laterality Date   COLONOSCOPY  10/02/2011   Procedure: COLONOSCOPY;  Surgeon: Rogene Houston, MD;  [facility];  Service: Endoscopy;  Laterality: N/A;  10:30 am   COLONOSCOPY N/A 12/21/2014   Procedure: COLONOSCOPY;  Surgeon: Rogene Houston, MD;  [facility];  Service: Endoscopy;  Laterality: N/A;  1030   CORONARY ARTERY BYPASS GRAFT  1995   CYSTOSCOPY W/ URETERAL STENT PLACEMENT Left 01/26/2020   Procedure: CYSTOSCOPY WITH RETROGRADE PYELOGRAM/URETERAL STENT  PLACEMENT;  Surgeon: Alexis Frock, MD;  Location[facility];  Service: Urology;  Laterality: Left;   CYSTOSCOPY WITH RETROGRADE PYELOGRAM, URETEROSCOPY AND STENT PLACEMENT Left 11/19/2019   Procedure: CYSTOSCOPY WITH RETROGRADE PYELOGRAM, DIAGNOSTIC URETEROSCOPY AND STENT PLACEMENT;  Surgeon: Alexis Frock, MD;  Location[facility];  Service: Urology;  Laterality: Left;  1   EYE SURGERY     feb 7 feb 28 [facility]   KIDNEY STONE SURGERY     NEPHROLITHOTOMY Left 01/26/2020   Procedure: NEPHROLITHOTOMY PERCUTANEOUS;  Surgeon: Alexis Frock, MD;  Location: [facility];  Service: Urology;  Laterality: Left;  3 HRS   PROSTATE BIOPSY     Family History  Problem Relation Age of Onset   Heart disease Mother    Hypertension Father    Hypertension Brother    Hypertension Brother    Hypertension Brother    Hypertension Brother    Social History   Socioeconomic History   Marital status: Married    Spouse name: Mitchell Chandler   Number of children: 1   Years of education: Not on file   Highest education level: Not on file  Occupational History   Occupation: retired  Tobacco Use   Smoking status: Former    Types: Cigarettes    Quit date: 12/21/1962    Years since quitting: 58.6   Smokeless tobacco: Never  Vaping Use   Vaping Use: Never used  Substance and Sexual Activity   Alcohol use: No   Drug use: No   Sexual activity: Not on file  Other Topics Concern   Not on file  Social History Narrative   Lives home with his wife, Mitchell Chandler - his daughter and grandchildren check on them 3 times a day. He stays active, mowing, yard work, eating out with friends, etc.   Social Determinants of Health   Financial Resource Strain: Low Risk    Difficulty of Paying Living Expenses: Not hard at all  Food Insecurity: No Food Insecurity   Worried About Charity fundraiser in the Last Year: Never true   Arboriculturist in the Last Year: Never true  Transportation Needs: No Transportation Needs    Lack of Transportation (Medical): No   Lack of Transportation (Non-Medical): No  Physical Activity: Insufficiently Active   Days of Exercise per Week: 7 days   Minutes of Exercise per Session: 20 min  Stress: No Stress Concern Present   Feeling of Stress : Not at all  Social Connections: Socially Integrated   Frequency of Communication with Friends and Family: More than three times a week   Frequency of Social Gatherings with Friends and Family: More than three times a week   Attends Religious Services: More than 4 times per year   Active Member of [employer] or Organizations: Yes   Attends Music therapist: More than 4 times per year   Marital Status: Married    Tobacco Counseling Counseling given: Not Answered   Clinical Intake:  Pre-visit preparation completed: Yes  Pain : No/denies pain     BMI - recorded: 28.28 Nutritional Status: BMI 25 -29 Overweight Nutritional Risks: None Diabetes: No  How often do you need to have someone help you when you read instructions, pamphlets, or other written materials from your doctor or pharmacy?: 1 - Never  Diabetic? No  Interpreter Needed?: No  Information entered by :: Leamon Palau, LPN   Activities of Daily Living In your present state of health, do you have any difficulty performing the following activities: 07/13/2021  Hearing? N  Vision? N  Difficulty concentrating or making decisions? N  Walking or climbing stairs? Y  Dressing or bathing? N  Doing errands, shopping? N  Preparing Food and eating ? N  Using the Toilet? N  In the past six months, have you accidently leaked urine? N  Do you have problems with loss of bowel control? N  Managing your Medications? N  Managing your Finances? N  Housekeeping or managing your Housekeeping? N  Some recent data might be hidden    Patient Care Team: Loman Brooklyn, FNP as PCP - General (Family Medicine) Leroy Sea, MD as Referring Physician  (Urology)  Indicate any recent Medical Services you may have received from other than [facility] providers in the past year (date may be approximate).     Assessment:   This is a routine wellness examination for [place].  Hearing/Vision screen Hearing Screening - Comments:: Denies hearing difficulties  Vision Screening - Comments:: Wears eyeglasses prn reading - behind on annual eye exams - says he no longer needs one since cataract surgery in [place] years ago  Dietary issues and exercise activities discussed: Current Exercise Habits: Home exercise routine, Type of exercise: walking, Time (Minutes): 20, Frequency (Times/Week): 7, Weekly Exercise (Minutes/Week): 140, Intensity: Mild, Exercise limited by: None identified   Goals Addressed             This Visit's Progress    LIFESTYLE - DECREASE FALLS RISK         Depression Screen PHQ 2/9 Scores 07/13/2021 07/05/2021 04/04/2021 01/10/2021 09/07/2020 09/07/2020 08/10/2020  PHQ - 2 Score 0 0 0 0 0 0 0  PHQ- 9 Score 2 3 - - 3 - -    Fall Risk Fall Risk  07/13/2021 07/05/2021 04/04/2021 01/10/2021 10/12/2020  Falls in the past year? 0 0 0 0 1  Number falls in past yr: 0 - - - 0  Injury with Fall? 0 - - - 0  Comment - - - - -  Risk for fall due to : History of fall(s);Impaired balance/gait;Impaired vision - - - Impaired balance/gait  Follow up Falls prevention discussed;Education provided - - - Falls prevention discussed    FALL RISK PREVENTION PERTAINING TO THE HOME:  Any stairs in or around the home? No  If so, are there any without handrails? No  Home free of loose throw rugs in walkways, pet beds, electrical cords, etc? Yes  Adequate lighting in your home to reduce risk of falls? Yes   ASSISTIVE DEVICES UTILIZED TO PREVENT FALLS:  Life alert? No  - he carries his cell phone around with speed dial Use of a cane, walker or w/c? No  - he has a walker, but doesn't use it. Grab bars in the bathroom? Yes  Shower chair or bench in shower? Yes   Elevated toilet seat or a handicapped toilet? Yes   TIMED UP AND GO:  Was the test performed? No .  Telephonic visit  Cognitive Function:     6CIT Screen 07/13/2021  What Year? 0 points  What month? 0 points  What time? 0 points  Count back from 20 0 points  Months in reverse 0 points  Repeat phrase 2 points  Total Score 2    Immunizations Immunization History  Administered Date(s) Administered   Fluad Quad(high Dose 65+) 09/07/2020   Influenza, High Dose Seasonal PF 09/19/2017, 09/07/2018   Influenza-Unspecified 09/01/2016, 09/19/2017, 09/07/2018, 09/08/2019   Moderna Sars-Covid-2 Vaccination 01/05/2020, 02/02/2020, 10/03/2020   Pneumococcal Conjugate-13 12/02/2006   Pneumococcal-Unspecified 09/01/2016    TDAP status: Due, Education has been provided regarding the importance of this vaccine. Advised may receive this vaccine at local pharmacy or Health Dept. Aware to provide a copy of the vaccination record if obtained from local pharmacy or Health Dept. Verbalized acceptance and understanding.  Flu Vaccine status: Up to date  Pneumococcal vaccine status: Up to date  Covid-19 vaccine status: Completed vaccines  Qualifies for Shingles Vaccine? No   Zostavax completed No   Shingrix Completed?: No.    Education has been provided regarding the importance of this vaccine. Patient has been advised to call insurance company to determine out of pocket expense if they have not yet received this vaccine. Advised may also receive vaccine at local pharmacy or Health Dept. Verbalized acceptance and understanding.  Screening Tests Health Maintenance  Topic Date Due   COVID-19 Vaccine (4 - Booster for Moderna series) 07/21/2021 (Originally 01/31/2021)   INFLUENZA VACCINE  08/21/2021 (Originally 07/02/2021)   Zoster Vaccines- Shingrix (1 of 2) 10/05/2021 (Originally 04/17/1984)   TETANUS/TDAP  01/10/2022 (Originally 04/17/1951)   PNA vac Low Risk Adult  Completed   HPV VACCINES  Aged Out     Health Maintenance  There are no preventive care reminders to display for this patient.  Colorectal cancer screening: No longer required.   Lung Cancer Screening: (Low Dose CT Chest recommended if Age 57-80 years, 30 pack-year currently smoking OR have quit w/in 15years.) does not qualify.   Additional Screening:  Hepatitis C Screening: does not qualify  Vision Screening: Recommended annual ophthalmology exams for early detection of glaucoma and other disorders of the eye. Is the patient up to date with their annual eye exam?  No  Who is the provider or what is the name of the office in which the patient attends annual eye exams? Unknown in [place] If pt is not established with a provider, would they like to be referred to a provider to establish care? No .   Dental Screening: Recommended annual dental exams for proper oral hygiene  Community Resource Referral / Chronic Care Management: CRR required this visit?  No   CCM required this visit?  No      Plan:     I have personally reviewed and noted the following in the patient's chart:   Medical and social history Use of alcohol, tobacco or illicit drugs  Current medications and supplements including opioid prescriptions. Patient is not currently taking opioid prescriptions. Functional ability and status Nutritional status Physical activity Advanced directives List of other physicians Hospitalizations, surgeries, and ER visits in previous 12 months Vitals Screenings to include cognitive, depression, and falls Referrals and appointments  In addition, I have reviewed and discussed with patient certain preventive protocols, quality metrics, and best practice recommendations. A written personalized care plan for preventive services as well as general preventive health recommendations were provided to patient.     Enas Winchel Dionne Ano, LPN  07/13/2021   Nurse Notes: None

## 2021-07-25 ENCOUNTER — Other Ambulatory Visit: Payer: Self-pay | Admitting: Family Medicine

## 2021-07-25 DIAGNOSIS — I10 Essential (primary) hypertension: Secondary | ICD-10-CM

## 2021-08-03 ENCOUNTER — Other Ambulatory Visit: Payer: Self-pay | Admitting: Family Medicine

## 2021-08-03 DIAGNOSIS — I10 Essential (primary) hypertension: Secondary | ICD-10-CM

## 2021-09-06 ENCOUNTER — Ambulatory Visit (INDEPENDENT_AMBULATORY_CARE_PROVIDER_SITE_OTHER): Payer: Medicare Other

## 2021-09-06 DIAGNOSIS — Z23 Encounter for immunization: Secondary | ICD-10-CM | POA: Diagnosis not present

## 2021-10-26 ENCOUNTER — Other Ambulatory Visit: Payer: Self-pay | Admitting: Family Medicine

## 2021-10-26 DIAGNOSIS — E559 Vitamin D deficiency, unspecified: Secondary | ICD-10-CM

## 2022-01-09 ENCOUNTER — Encounter: Payer: Self-pay | Admitting: Family Medicine

## 2022-01-09 ENCOUNTER — Ambulatory Visit (INDEPENDENT_AMBULATORY_CARE_PROVIDER_SITE_OTHER): Payer: Medicare Other | Admitting: Family Medicine

## 2022-01-09 VITALS — BP 134/57 | HR 66 | Temp 97.1°F | Ht 68.0 in | Wt 185.8 lb

## 2022-01-09 DIAGNOSIS — R350 Frequency of micturition: Secondary | ICD-10-CM

## 2022-01-09 DIAGNOSIS — N401 Enlarged prostate with lower urinary tract symptoms: Secondary | ICD-10-CM

## 2022-01-09 DIAGNOSIS — E785 Hyperlipidemia, unspecified: Secondary | ICD-10-CM | POA: Diagnosis not present

## 2022-01-09 DIAGNOSIS — N1831 Chronic kidney disease, stage 3a: Secondary | ICD-10-CM

## 2022-01-09 DIAGNOSIS — I10 Essential (primary) hypertension: Secondary | ICD-10-CM

## 2022-01-09 DIAGNOSIS — M1990 Unspecified osteoarthritis, unspecified site: Secondary | ICD-10-CM

## 2022-01-09 DIAGNOSIS — E559 Vitamin D deficiency, unspecified: Secondary | ICD-10-CM

## 2022-01-09 MED ORDER — SIMVASTATIN 20 MG PO TABS: 20.0000 mg | ORAL_TABLET | Freq: Every day | ORAL | 1 refills | Status: DC

## 2022-01-09 MED ORDER — DOXAZOSIN MESYLATE 8 MG PO TABS: ORAL_TABLET | ORAL | 1 refills | Status: DC

## 2022-01-09 MED ORDER — FUROSEMIDE 20 MG PO TABS: 20.0000 mg | ORAL_TABLET | Freq: Every day | ORAL | 1 refills | Status: DC

## 2022-01-09 MED ORDER — FOLBEE 2.5-25-1 MG PO TABS: ORAL_TABLET | ORAL | 1 refills | Status: DC

## 2022-01-09 MED ORDER — AMLODIPINE BESY-BENAZEPRIL HCL 5-10 MG PO CAPS: 1.0000 | ORAL_CAPSULE | Freq: Every day | ORAL | 1 refills | Status: DC

## 2022-01-09 MED ORDER — METOPROLOL TARTRATE 50 MG PO TABS: 50.0000 mg | ORAL_TABLET | Freq: Every day | ORAL | 1 refills | Status: DC

## 2022-01-09 NOTE — Progress Notes (Signed)
Assessment & Plan:  1. Essential (primary) hypertension Well controlled on current regimen.  - amLODipine-benazepril (LOTREL) 5-10 MG capsule; Take 1 capsule by mouth daily.  Dispense: 90 capsule; Refill: 1 - furosemide (LASIX) 20 MG tablet; Take 1 tablet (20 mg total) by mouth daily.  Dispense: 90 tablet; Refill: 1 - metoprolol tartrate (LOPRESSOR) 50 MG tablet; Take 1 tablet (50 mg total) by mouth daily.  Dispense: 90 tablet; Refill: 1  2. Dyslipidemia Well controlled on current regimen. Lipid panel today. - simvastatin (ZOCOR) 20 MG tablet; Take 1 tablet (20 mg total) by mouth at bedtime.  Dispense: 90 tablet; Refill: 1  3. Stage 3a chronic kidney disease (HCC) Stable. CMP today.  4. Benign prostatic hyperplasia with urinary frequency Well controlled on current regimen. Patient to keep upcoming appointment with urology. - doxazosin (CARDURA) 8 MG tablet; 1 tablet daily  Dispense: 90 tablet; Refill: 1  5. Arthritis Well controlled on current regimen.   6. Vitamin D deficiency Vitamin D level today. Will D/C weekly supplement and recommend daily vitamin D3 1,000 units if WNL.    Return in about 6 months (around 07/09/2022) for annual physical.  Mitchell Limes, MSN, APRN, FNP-C [facility]  Subjective:    Patient ID: Mitchell Chandler, male    DOB: 1932-05-14, 88 y.o.   MRN: [id number]  Patient Care Team: Loman Brooklyn, FNP as PCP - General (Family Medicine) Alexis Frock, MD as Consulting Physician (Urology)   Chief Complaint:  Chief Complaint  Patient presents with   Hypertension    HPI: Mitchell Chandler is a 88 y.o. male presenting on 01/09/2022 for Hypertension  Hypertension: Patient does monitor his blood pressures at home and reports good readings. He denies any chest pain, headaches, dizziness or lightheadedness.  Dyslipidemia: taking simvastatin.  BPH: patient reports he is scheduled for a follow-up with urology in March.   CKD: avoiding  NSAIDs. Previously has been stable.  Arthritis: takes Tylenol as needed.   Vitamin D deficiency: last vitamin D level normal a year ago. He has been taking the weekly vitamin D 50,000 unit supplement.   New complaints: None   Social history:  Relevant past medical, surgical, family and social history reviewed and updated as indicated. Interim medical history since our last visit reviewed.  Allergies and medications reviewed and updated.  DATA REVIEWED: CHART IN EPIC  ROS: Negative unless specifically indicated above in HPI.    Current Outpatient Medications:    acetaminophen (TYLENOL) 500 MG tablet, Take 500-1,000 mg by mouth every 6 (six) hours as needed (for arthritis pain (neck/shoulder)). , Disp: , Rfl:    amLODipine-benazepril (LOTREL) 5-10 MG capsule, TAKE 1 CAPSULE ONCE DAILY, Disp: 30 capsule, Rfl: 5   aspirin 81 MG EC tablet, Take 81 mg by mouth daily. Swallow whole., Disp: , Rfl:    doxazosin (CARDURA) 8 MG tablet, 1 tablet daily, Disp: 90 tablet, Rfl: 1   Folic Acid-Vit I7-TIW P80 (FOLBEE) 2.5-25-1 MG TABS tablet, 1 tablet daily, Disp: 90 tablet, Rfl: 1   furosemide (LASIX) 20 MG tablet, Take 1 tablet (20 mg total) by mouth daily., Disp: 90 tablet, Rfl: 1   metoprolol tartrate (LOPRESSOR) 50 MG tablet, Take 1 tablet (50 mg total) by mouth daily., Disp: 90 tablet, Rfl: 1   simvastatin (ZOCOR) 20 MG tablet, Take 1 tablet (20 mg total) by mouth at bedtime., Disp: 90 tablet, Rfl: 1   Vitamin D, Ergocalciferol, (DRISDOL) 1.25 MG (50000 UNIT) CAPS capsule, TAKE 1  CAPSULE ONCE A WEEK, Disp: 12 capsule, Rfl: 0   No Known Allergies Past Medical History:  Diagnosis Date   Benign prostatic hyperplasia 12/25/2016   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 Yearly surviellance.    Renal stone 01/26/2020    Vitamin D deficiency 08/13/2020    Past Surgical History:  Procedure Laterality Date   COLONOSCOPY  10/02/2011   Procedure: COLONOSCOPY;  Surgeon: Rogene Houston, MD;  [facility]: [facility];  Service: Endoscopy;  Laterality: N/A;  10:30 am   COLONOSCOPY N/A 12/21/2014   Procedure: COLONOSCOPY;  Surgeon: Rogene Houston, MD;  Location: [facility];  Service: Endoscopy;  Laterality: N/A;  1030   CORONARY ARTERY BYPASS GRAFT  1995   CYSTOSCOPY W/ URETERAL STENT PLACEMENT Left 01/26/2020   Procedure: CYSTOSCOPY WITH RETROGRADE PYELOGRAM/URETERAL STENT PLACEMENT;  Surgeon: Alexis Frock, MD;  Location: [facility];  Service: Urology;  Laterality: Left;   CYSTOSCOPY WITH RETROGRADE PYELOGRAM, URETEROSCOPY AND STENT PLACEMENT Left 11/19/2019   Procedure: CYSTOSCOPY WITH RETROGRADE PYELOGRAM, DIAGNOSTIC URETEROSCOPY AND STENT PLACEMENT;  Surgeon: Alexis Frock, MD;  Location: [facility];  Service: Urology;  Laterality: Left;  1   EYE SURGERY     feb 7 feb 28 [facility]   KIDNEY STONE SURGERY     NEPHROLITHOTOMY Left 01/26/2020   Procedure: NEPHROLITHOTOMY PERCUTANEOUS;  Surgeon: Alexis Frock, MD;  Location: [facility];  Service: Urology;  Laterality: Left;  3 HRS   PROSTATE BIOPSY      Social History   Socioeconomic History   Marital status: Married    Spouse name: Rod Holler   Number of children: 1   Years of education: Not on file   Highest education level: Not on file  Occupational History   Occupation: retired  Tobacco Use   Smoking status: Former    Types: Cigarettes    Quit date: 12/21/1962    Years since quitting: 59.0   Smokeless tobacco: Never  Vaping Use   Vaping Use: Never used  Substance and Sexual Activity   Alcohol use: No   Drug use: No   Sexual activity: Not on file  Other Topics Concern   Not on file  Social History Narrative   Lives home with his wife, Rod Holler - his daughter and grandchildren check on them 3 times a day. He stays active, mowing, yard  work, eating out with friends, etc.   Social Determinants of Health   Financial Resource Strain: Low Risk    Difficulty of Paying Living Expenses: Not hard at all  Food Insecurity: No Food Insecurity   Worried About Charity fundraiser in the Last Year: Never true   Arboriculturist in the Last Year: Never true  Transportation Needs: No Transportation Needs   Lack of Transportation (Medical): No   Lack of Transportation (Non-Medical): No  Physical Activity: Insufficiently Active   Days of Exercise per Week: 7 days   Minutes of Exercise per Session: 20 min  Stress: No Stress Concern Present   Feeling of Stress : Not at all  Social Connections: Socially Integrated   Frequency of Communication with Friends and Family: More than three times a week   Frequency of Social Gatherings with Friends and Family: More than three times a  week   Attends Religious Services: More than 4 times per year   Active Member of Clubs or Organizations: Yes   Attends Music therapist: More than 4 times per year   Marital Status: Married  Human resources officer Violence: Not At Risk   Fear of Current or Ex-Partner: No   Emotionally Abused: No   Physically Abused: No   Sexually Abused: No        Objective:    BP (!) 134/57    Pulse 66    Temp (!) 97.1 F (36.2 C)    Ht _0  (1.727 m)    Wt 185 lb 12.8 oz (84.3 kg)    SpO2 95%    BMI 28.25 kg/m   Wt Readings from Last 3 Encounters:  01/09/22 185 lb 12.8 oz (84.3 kg)  07/13/21 186 lb (84.4 kg)  07/05/21 186 lb 3.2 oz (84.5 kg)   Physical Exam Vitals reviewed.  Constitutional:      General: He is not in acute distress.    Appearance: Normal appearance. He is overweight. He is not ill-appearing, toxic-appearing or diaphoretic.  HENT:     Head: Normocephalic and atraumatic.  Eyes:     General: No scleral icterus.       Right eye: No discharge.        Left eye: No discharge.     Conjunctiva/sclera: Conjunctivae normal.  Cardiovascular:      Rate and Rhythm: Normal rate and regular rhythm.     Heart sounds: Normal heart sounds. No murmur heard.   No friction rub. No gallop.  Pulmonary:     Effort: Pulmonary effort is normal. No respiratory distress.     Breath sounds: Normal breath sounds. No stridor. No wheezing, rhonchi or rales.  Musculoskeletal:        General: Normal range of motion.     Cervical back: Normal range of motion.  Skin:    General: Skin is warm and dry.  Neurological:     Mental Status: He is alert and oriented to person, place, and time. Mental status is at baseline.  Psychiatric:        Mood and Affect: Mood normal.        Behavior: Behavior normal.        Thought Content: Thought content normal.        Judgment: Judgment normal.    No results found for: TSH Lab Results  Component Value Date   WBC 8.4 01/10/2021   HGB 15.9 01/10/2021   HCT 46.2 01/10/2021   MCV 93 01/10/2021   PLT 215 01/10/2021   Lab Results  Component Value Date   NA 142 04/04/2021   K 4.6 04/04/2021   CO2 21 04/04/2021   GLUCOSE 112 (H) 04/04/2021   BUN 16 04/04/2021   CREATININE 1.10 04/04/2021   BILITOT 0.5 04/04/2021   ALKPHOS 80 04/04/2021   AST 18 04/04/2021   ALT 12 04/04/2021   PROT 7.2 04/04/2021   ALBUMIN 4.3 04/04/2021   CALCIUM 9.6 04/04/2021   ANIONGAP 6 08/08/2020   EGFR 65 04/04/2021   Lab Results  Component Value Date   CHOL 149 01/10/2021   Lab Results  Component Value Date   HDL 58 01/10/2021   Lab Results  Component Value Date   LDLCALC 72 01/10/2021   Lab Results  Component Value Date   TRIG 108 01/10/2021   Lab Results  Component Value Date   CHOLHDL 2.6 01/10/2021   No results  found for: HGBA1C

## 2022-01-11 LAB — CMP14+EGFR
ALT: 14 IU/L (ref 0–44)
AST: 30 IU/L (ref 0–40)
Albumin/Globulin Ratio: 1.6 (ref 1.2–2.2)
Albumin: 4.4 g/dL (ref 3.6–4.6)
Alkaline Phosphatase: 81 IU/L (ref 44–121)
BUN/Creatinine Ratio: 9 — ABNORMAL LOW (ref 10–24)
BUN: 12 mg/dL (ref 8–27)
Bilirubin Total: 0.3 mg/dL (ref 0.0–1.2)
CO2: 17 mmol/L — ABNORMAL LOW (ref 20–29)
Calcium: 10 mg/dL (ref 8.6–10.2)
Chloride: 107 mmol/L — ABNORMAL HIGH (ref 96–106)
Creatinine, Ser: 1.34 mg/dL — ABNORMAL HIGH (ref 0.76–1.27)
Globulin, Total: 2.8 g/dL (ref 1.5–4.5)
Glucose: 100 mg/dL — ABNORMAL HIGH (ref 70–99)
Potassium: 4.7 mmol/L (ref 3.5–5.2)
Sodium: 145 mmol/L — ABNORMAL HIGH (ref 134–144)
Total Protein: 7.2 g/dL (ref 6.0–8.5)
eGFR: 51 mL/min/{1.73_m2} — ABNORMAL LOW (ref >59)

## 2022-01-11 LAB — CBC WITH DIFFERENTIAL/PLATELET
Basophils Absolute: 0 10*3/uL (ref 0.0–0.2)
Basos: 0 %
EOS (ABSOLUTE): 0.4 10*3/uL (ref 0.0–0.4)
Eos: 5 %
Hematocrit: 47.9 % (ref 37.5–51.0)
Hemoglobin: 16.1 g/dL (ref 13.0–17.7)
Immature Grans (Abs): 0 10*3/uL (ref 0.0–0.1)
Immature Granulocytes: 0 %
Lymphocytes Absolute: 2 10*3/uL (ref 0.7–3.1)
Lymphs: 27 %
MCH: 32.1 pg (ref 26.6–33.0)
MCHC: 33.6 g/dL (ref 31.5–35.7)
MCV: 95 fL (ref 79–97)
Monocytes Absolute: 0.5 10*3/uL (ref 0.1–0.9)
Monocytes: 6 %
Neutrophils Absolute: 4.6 10*3/uL (ref 1.4–7.0)
Neutrophils: 62 %
Platelets: 197 10*3/uL (ref 150–450)
RBC: 5.02 x10E6/uL (ref 4.14–5.80)
RDW: 13.3 % (ref 11.6–15.4)
WBC: 7.5 10*3/uL (ref 3.4–10.8)

## 2022-01-11 LAB — HCV AB W REFLEX TO QUANT PCR: HCV Ab: <0.1 {s_co_ratio} (ref 0.0–0.9)

## 2022-01-11 LAB — VITAMIN D 25 HYDROXY (VIT D DEFICIENCY, FRACTURES): Vit D, 25-Hydroxy: 95.3 ng/mL (ref 30.0–100.0)

## 2022-01-11 LAB — HCV INTERPRETATION

## 2022-01-11 LAB — LIPID PANEL
Chol/HDL Ratio: 3.2 ratio (ref 0.0–5.0)
Cholesterol, Total: 159 mg/dL (ref 100–199)
HDL: 50 mg/dL
LDL Chol Calc (NIH): 70 mg/dL (ref 0–99)
Triglycerides: 240 mg/dL — ABNORMAL HIGH (ref 0–149)
VLDL Cholesterol Cal: 39 mg/dL (ref 5–40)

## 2022-01-14 ENCOUNTER — Encounter: Payer: Self-pay | Admitting: Family Medicine

## 2022-01-18 ENCOUNTER — Other Ambulatory Visit: Payer: Medicare Other

## 2022-01-18 DIAGNOSIS — N1831 Chronic kidney disease, stage 3a: Secondary | ICD-10-CM

## 2022-01-18 LAB — BMP8+EGFR
BUN/Creatinine Ratio: 13 (ref 10–24)
BUN: 17 mg/dL (ref 8–27)
CO2: 22 mmol/L (ref 20–29)
Calcium: 9.5 mg/dL (ref 8.6–10.2)
Chloride: 106 mmol/L (ref 96–106)
Creatinine, Ser: 1.33 mg/dL — ABNORMAL HIGH (ref 0.76–1.27)
Glucose: 89 mg/dL (ref 70–99)
Potassium: 4.8 mmol/L (ref 3.5–5.2)
Sodium: 141 mmol/L (ref 134–144)
eGFR: 52 mL/min/{1.73_m2} — ABNORMAL LOW (ref >59)

## 2022-06-14 ENCOUNTER — Other Ambulatory Visit: Payer: Self-pay

## 2022-06-14 ENCOUNTER — Emergency Department (HOSPITAL_COMMUNITY): Payer: Medicare Other

## 2022-06-14 ENCOUNTER — Emergency Department (HOSPITAL_COMMUNITY)
Admission: EM | Admit: 2022-06-14 | Discharge: 2022-06-14 | Disposition: A | Discharge status: Home / Self Care | Payer: Medicare Other | Attending: Emergency Medicine | Admitting: Emergency Medicine

## 2022-06-14 ENCOUNTER — Encounter (HOSPITAL_COMMUNITY): Payer: Self-pay | Admitting: Emergency Medicine

## 2022-06-14 DIAGNOSIS — K5732 Diverticulitis of large intestine without perforation or abscess without bleeding: Secondary | ICD-10-CM | POA: Diagnosis not present

## 2022-06-14 DIAGNOSIS — Z87442 Personal history of urinary calculi: Secondary | ICD-10-CM | POA: Insufficient documentation

## 2022-06-14 DIAGNOSIS — Z7982 Long term (current) use of aspirin: Secondary | ICD-10-CM | POA: Diagnosis not present

## 2022-06-14 DIAGNOSIS — R109 Unspecified abdominal pain: Secondary | ICD-10-CM | POA: Diagnosis present

## 2022-06-14 LAB — COMPREHENSIVE METABOLIC PANEL
ALT: 16 U/L (ref 0–44)
AST: 23 U/L (ref 15–41)
Albumin: 4.1 g/dL (ref 3.5–5.0)
Alkaline Phosphatase: 59 U/L (ref 38–126)
Anion gap: 7 (ref 5–15)
BUN: 15 mg/dL (ref 8–23)
CO2: 24 mmol/L (ref 22–32)
Calcium: 9.2 mg/dL (ref 8.9–10.3)
Chloride: 108 mmol/L (ref 98–111)
Creatinine, Ser: 1.2 mg/dL (ref 0.61–1.24)
GFR, Estimated: 58 mL/min — ABNORMAL LOW (ref >60)
Glucose, Bld: 92 mg/dL (ref 70–99)
Potassium: 4.3 mmol/L (ref 3.5–5.1)
Sodium: 139 mmol/L (ref 135–145)
Total Bilirubin: 1 mg/dL (ref 0.3–1.2)
Total Protein: 7.3 g/dL (ref 6.5–8.1)

## 2022-06-14 LAB — CBC WITH DIFFERENTIAL/PLATELET
Abs Immature Granulocytes: 0.02 10*3/uL (ref 0.00–0.07)
Basophils Absolute: 0 10*3/uL (ref 0.0–0.1)
Basophils Relative: 0 %
Eosinophils Absolute: 0.1 10*3/uL (ref 0.0–0.5)
Eosinophils Relative: 1 %
HCT: 47 % (ref 39.0–52.0)
Hemoglobin: 16 g/dL (ref 13.0–17.0)
Immature Granulocytes: 0 %
Lymphocytes Relative: 20 %
Lymphs Abs: 1.8 10*3/uL (ref 0.7–4.0)
MCH: 32.3 pg (ref 26.0–34.0)
MCHC: 34 g/dL (ref 30.0–36.0)
MCV: 94.9 fL (ref 80.0–100.0)
Monocytes Absolute: 0.6 10*3/uL (ref 0.1–1.0)
Monocytes Relative: 7 %
Neutro Abs: 6.3 10*3/uL (ref 1.7–7.7)
Neutrophils Relative %: 72 %
Platelets: 211 10*3/uL (ref 150–400)
RBC: 4.95 MIL/uL (ref 4.22–5.81)
RDW: 12.9 % (ref 11.5–15.5)
WBC: 8.8 10*3/uL (ref 4.0–10.5)
nRBC: 0 % (ref 0.0–0.2)

## 2022-06-14 MED ORDER — AMOXICILLIN-POT CLAVULANATE 875-125 MG PO TABS: 1.0000 | ORAL_TABLET | Freq: Once | ORAL | Status: DC

## 2022-06-14 MED ORDER — AMOXICILLIN-POT CLAVULANATE 875-125 MG PO TABS
1.0000 | ORAL_TABLET | Freq: Two times a day (BID) | ORAL | 0 refills | Status: AC
Start: 2022-06-14 — End: 2022-06-21

## 2022-06-14 NOTE — ED Triage Notes (Signed)
Pt to the ED with complaints of left side flank pain for the past few days. Pt has a history of passing kidney stone in the past.   Pt states his feet are heavy and tingling. Pt denies neuropathy.

## 2022-06-14 NOTE — Discharge Instructions (Addendum)
Please contact your primary care provider's clinic to schedule a follow-up appointment as soon as possible for your ER visit.  As I explained, many patients need further work-up as an outpatient for their medical needs.  This could include your weakness.  I prescribed you antibiotics for 7 days to treat for the diverticulitis that we saw on the sigmoid colon on your CT scan

## 2022-06-14 NOTE — ED Provider Notes (Signed)
[facility] EMERGENCY DEPARTMENT Provider Note   CSN: [id number] Arrival date & time: 06/14/22  1001     History  Chief Complaint  Patient presents with   Flank Pain    Mitchell Chandler is a 88 y.o. male presenting to the ED complaining of left-sided flank pain, intermittent for the past 4 to 5 days, and also passing bloody clots when urinating.  He is concerned about a kidney stone.  He reports has a history of multiple kidney stones and has required "surgical intervention" in the past for his stones.  He has had waxing and waning pain in his left flank which is not severe for the past few days.  He separately reports that he has had generalized weakness for about 2 years.  He says he has pins-and-needles and numbness in his feet from just below the knees bilaterally to his toes.  He denies history of diabetes.   HPI     Home Medications Prior to Admission medications   Medication Sig Start Date End Date Taking? Authorizing Provider  acetaminophen (TYLENOL) 500 MG tablet Take 500-1,000 mg by mouth every 6 (six) hours as needed (for arthritis pain (neck/shoulder)).    Yes [provider]  amLODipine-benazepril (LOTREL) 5-10 MG capsule Take 1 capsule by mouth daily. 01/09/22  Yes Loman Brooklyn, FNP  amoxicillin-clavulanate (AUGMENTIN) 875-125 MG tablet Take 1 tablet by mouth 2 (two) times daily for 7 days. 06/14/22 06/21/22 Yes Gena Laski, Carola Rhine, MD  aspirin 81 MG EC tablet Take 81 mg by mouth daily. Swallow whole.   Yes [provider]  doxazosin (CARDURA) 8 MG tablet 1 tablet daily 01/09/22  Yes [name] F, FNP  Folic Acid-Vit G8-ZMO Q94 (FOLBEE) 2.5-25-1 MG TABS tablet 1 tablet daily 01/09/22  Yes [name] F, FNP  furosemide (LASIX) 20 MG tablet Take 1 tablet (20 mg total) by mouth daily. 01/09/22  Yes [name] F, FNP  metoprolol tartrate (LOPRESSOR) 50 MG tablet Take 1 tablet (50 mg total) by mouth daily. 01/09/22  Yes [name] F, FNP  simvastatin  (ZOCOR) 20 MG tablet Take 1 tablet (20 mg total) by mouth at bedtime. 01/09/22  Yes Loman Brooklyn, FNP      Allergies    Patient has no known allergies.    Review of Systems   Review of Systems  Physical Exam Updated Vital Signs BP (!) 155/66   Pulse (!) 54   Temp 97.9 F (36.6 C) (Oral)   Resp 17   Ht '5\' 8"'$  (1.727 m)   Wt 81.6 kg   SpO2 96%   BMI 27.37 kg/m  Physical Exam Constitutional:      General: He is not in acute distress. HENT:     Head: Normocephalic and atraumatic.  Eyes:     Conjunctiva/sclera: Conjunctivae normal.     Pupils: Pupils are equal, round, and reactive to light.  Cardiovascular:     Rate and Rhythm: Normal rate and regular rhythm.  Pulmonary:     Effort: Pulmonary effort is normal. No respiratory distress.  Abdominal:     General: There is no distension.     Tenderness: There is no abdominal tenderness.  Skin:    General: Skin is warm and dry.  Neurological:     General: No focal deficit present.     Mental Status: He is alert. Mental status is at baseline.     Comments: Neuropathy of the bilateral lower extremities  Psychiatric:  Mood and Affect: Mood normal.        Behavior: Behavior normal.     ED Results / Procedures / Treatments   Labs (all labs ordered are listed, but only abnormal results are displayed) Labs Reviewed  COMPREHENSIVE METABOLIC PANEL - Abnormal; Notable for the following components:      Result Value   GFR, Estimated 58 (*)    All other components within normal limits  CBC WITH DIFFERENTIAL/PLATELET    EKG None  Radiology CT Renal Stone Study  Result Date: 06/14/2022 CLINICAL DATA:  Left-sided flank pain, kidney stone suspected. EXAM: CT ABDOMEN AND PELVIS WITHOUT CONTRAST TECHNIQUE: Multidetector CT imaging of the abdomen and pelvis was performed following the standard protocol without IV contrast. RADIATION DOSE REDUCTION: This exam was performed according to the departmental dose-optimization  program which includes automated exposure control, adjustment of the mA and/or kV according to patient size and/or use of iterative reconstruction technique. COMPARISON:  CT of the abdomen and pelvis 05/21/2019. MR abdomen pelvis 10/04/2019 FINDINGS: Lower chest: The lung bases are clear without focal nodule, mass, or airspace disease. Heart size is normal. Extensive coronary artery calcifications are present. The patient is status post median sternotomy no significant pleural or pericardial effusion is present. Hepatobiliary: Liver is unremarkable. Multiple layering stones are present in the gallbladder without inflammatory changes to suggest cholecystitis. Common bile duct is within normal limits. Pancreas: Unremarkable. No pancreatic ductal dilatation or surrounding inflammatory changes. Spleen: Normal in size without focal abnormality. Adrenals/Urinary Tract: Adrenal glands are normal bilaterally. 18 mm low-density lesion at the upper pole of the right kidney has increased slightly in size. A similar low-density lesion at the lower pole of the left kidney has also increased slightly in size, now measuring 12 mm. A 4 mm nonobstructing stone is present at the lower pole of the left kidney. No other stone or mass lesion is present. No obstruction is present. Ureters are within normal limits. The urinary bladder is normal. Stomach/Bowel: The stomach and duodenum are within normal limits. Small bowel is unremarkable. The terminal ileum is within normal limits. The appendix is visualized and normal. The ascending and transverse colon are within normal limits. Diverticular changes are present in the distal descending colon and sigmoid colon. Mild inflammatory changes surround the diverticula of the proximal sigmoid. No free air or free fluid is present. The distal sigmoid colon and rectum are within normal limits. Vascular/Lymphatic: Extensive atherosclerotic calcifications are present the aorta branch vessels. No  aneurysm is present. No significant abdominal adenopathy is present. Reproductive: Prostate is enlarged, measuring 6.2 cm in transverse diameter. Other: No abdominal wall hernia or abnormality. No abdominopelvic ascites. Musculoskeletal: Vertebral body heights are normal. Slight degenerative anterolisthesis present at L4-5. Anterior osteophytes are fused in the lower thoracic spine to L1. No focal osseous lesions are present. Bony pelvis is normal. The hips are located and within normal limits. IMPRESSION: 1. Sigmoid diverticulitis without evidence for perforation or abscess. 2. Nonobstructing 4 mm stone at the lower pole of the left kidney. 3. No renal obstruction or other significant nephrolithiasis. 4. Cholelithiasis without evidence for cholecystitis. 5. Slight increase in size of low-density lesions at the upper pole of the right kidney and lower pole of the left kidney. Given the stability over time, these likely represent simple cysts. Recommend no follow-up. 6. Coronary artery disease. 7. Enlarged prostate gland. 8. Aortic Atherosclerosis (ICD10-I70.0). Electronically Signed   By: San Morelle M.D.   On: 06/14/2022 11:36    Procedures  Procedures    Medications Ordered in ED Medications - No data to display  ED Course/ Medical Decision Making/ A&P Clinical Course as of 06/14/22 1226  Fri Jun 14, 2022  1221 Patient reassessed.  Reviewed CT findings, consistent with sigmoid diverticulitis likely the cause of his pain.  I will prescribe Augmentin, sent to his pharmacy, they will pick it up today and begin taking it at home.  Okay for discharge [MT]    Clinical Course User Index [MT] Foy Mungia, Carola Rhine, MD                           Medical Decision Making Amount and/or Complexity of Data Reviewed Labs: ordered. Radiology: ordered.  Risk Prescription drug management.   This patient presents to the ED with concern for flank pain, generalized weakness. This involves an extensive  number of treatment options, and is a complaint that carries with it a high risk of complications and morbidity.  The differential diagnosis includes for his flank pain ureteral colic versus UTI versus other.  The generalized weakness has been a chronic issue ongoing for several months or years.  We can check his electrolytes and hemoglobin level.  This does not seem consistent with ACS, or other medical emergency, but will need follow-up through his PCP.  He is quite comfortable on exam, no focal abdominal tenderness or rebound to suggest acute colitis or GI perforation.  CT renal stone study ordered  Co-morbidities that complicate the patient evaluation: History of kidney stones that she reports requiring surgical intervention does raise risk for complicated kidney stone  Additional history obtained from patient's son-in-law at bedside  External records from outside source obtained and reviewed including MRI of the abdomen performed in November 2020 showing a 1.9 cm right upper pole lesion, left renal cyst, gallstones.  CT scan of the abdomen performed in June 2020 demonstrating several left-sided renal calculi the largest of which measures 1.4 cm.  I ordered and personally interpreted labs.  The pertinent results include: CMP and CBC unremarkable.  I ordered imaging studies including CT renal stone study I independently visualized and interpreted imaging which showed acute uncomplicated sigmoid diverticulitis.  He is not actively passing kidney stone. I agree with the radiologist interpretation  The patient was maintained on a cardiac monitor.  I personally viewed and interpreted the cardiac monitored which showed an underlying rhythm of: NSR  After the interventions noted above, I reevaluated the patient and found that they have: stayed the same   Based on his clinical work-up and exam, at this time I will lower suspicion for sepsis, aortic dissection, AAA, or other life-threatening  intra-abdominal or vascular emergency or injury.  I have a low suspicion for ACS or pulmonary embolism.  I suspect her generalized weakness is a chronic medical condition, for which she will need follow-up with a PCP.  We did discuss compression stockings which may help with his leg swelling, which he reports gets better at night when he is in bed and worse throughout the day.  This is likely related to venous stasis.  I have a low suspicion for DVT.  Dispostion:  After consideration of the diagnostic results and the patients response to treatment, I feel that the patent would benefit from outpatient PCP follow.  Augmentin prescribed 7 days for diverticulitis.        Final Clinical Impression(s) / ED Diagnoses Final diagnoses:  Sigmoid diverticulitis    Rx / DC  Orders ED Discharge Orders          Ordered    amoxicillin-clavulanate (AUGMENTIN) 875-125 MG tablet  2 times daily        06/14/22 1221              Wyvonnia Dusky, MD 06/14/22 1226

## 2022-06-14 NOTE — ED Notes (Addendum)
EDP at [facility]. Pt alert,NAD, calm, interactive, resps e/u. Family at [facility]. EDP at [facility]. Attempted IV x2 w/o success. Labs obtained and sent.

## 2022-06-19 ENCOUNTER — Ambulatory Visit (INDEPENDENT_AMBULATORY_CARE_PROVIDER_SITE_OTHER): Payer: Medicare Other | Admitting: Family Medicine

## 2022-06-19 ENCOUNTER — Encounter: Payer: Self-pay | Admitting: Family Medicine

## 2022-06-19 VITALS — BP 95/48 | HR 51 | Temp 96.6°F | Ht 68.0 in | Wt 185.0 lb

## 2022-06-19 DIAGNOSIS — R110 Nausea: Secondary | ICD-10-CM | POA: Diagnosis not present

## 2022-06-19 DIAGNOSIS — N1831 Chronic kidney disease, stage 3a: Secondary | ICD-10-CM | POA: Diagnosis not present

## 2022-06-19 DIAGNOSIS — I10 Essential (primary) hypertension: Secondary | ICD-10-CM

## 2022-06-19 DIAGNOSIS — K5732 Diverticulitis of large intestine without perforation or abscess without bleeding: Secondary | ICD-10-CM | POA: Diagnosis not present

## 2022-06-19 MED ORDER — ONDANSETRON HCL 4 MG PO TABS: 4.0000 mg | ORAL_TABLET | Freq: Three times a day (TID) | ORAL | 0 refills | Status: DC | PRN

## 2022-06-19 NOTE — Progress Notes (Signed)
Established Patient Office Visit  Subjective   Patient ID: Mitchell Chandler, male    DOB: 07-28-1932  Age: 88 y.o. MRN: [id number]  Chief Complaint  Patient presents with   ER follow up    HPI Mitchell Chandler is here with his daughter today for a ER follow up. He was seen at [facility] on 06/14/22 for flank pain. He had a CT scan that showed sigmoid diverticulitis. He was discharged with augmentin as treatment. He has been taking Augmentin as prescribed. He has been having some nausea from the Augmentin. Because of this, he has not been eating or drinking much. He did have diarrhea days ago, but not recently. Denies fever or vomiting. Abdominal pain has resolved. He has not been checking his BP at home.   Past Medical History:  Diagnosis Date   Benign prostatic hyperplasia 12/25/2016   Heart block AV complete [facility]) oct 1995   [facility] 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 Yearly surviellance.    Renal stone 01/26/2020   Vitamin D deficiency 08/13/2020      ROS As per HPI.    Objective:     BP (!) 95/48   Pulse (!) 51   Temp (!) 96.6 F (35.9 C) (Oral)   Ht '5\' 8"'  (1.727 m)   Wt 185 lb (83.9 kg)   SpO2 99%   BMI 28.13 kg/m  BP Readings from Last 3 Encounters:  06/19/22 (!) 95/48  06/14/22 (!) 147/67  01/09/22 (!) 134/57      Physical Exam Vitals and nursing note reviewed.  Constitutional:      General: He is not in acute distress.    Appearance: He is not ill-appearing, toxic-appearing or diaphoretic.  Cardiovascular:     Rate and Rhythm: Normal rate and regular rhythm.     Heart sounds: Normal heart sounds. No murmur heard. Pulmonary:     Effort: Pulmonary effort is normal. No respiratory distress.     Breath sounds: Normal breath sounds.  Abdominal:     General: Bowel sounds are normal. There is no distension.     Palpations: Abdomen is soft.      Tenderness: There is no abdominal tenderness. There is no guarding or rebound.  Musculoskeletal:     Right lower leg: No edema.     Left lower leg: No edema.  Skin:    General: Skin is warm and dry.  Neurological:     General: No focal deficit present.     Mental Status: He is alert and oriented to person, place, and time.  Psychiatric:        Mood and Affect: Mood normal.        Behavior: Behavior normal.      No results found for any visits on 06/19/22.    The ASCVD Risk score (Arnett DK, et al., 2019) failed to calculate for the following reasons:   The 2019 ASCVD risk score is only valid for ages 17 to 59    Assessment & Plan:   Elchanan was seen today for er follow up.  Diagnoses and all orders for this visit:  Sigmoid diverticulitis Improving symptoms. No fever or chills. Complete augmentin as prescribed.  -     CBC with Differential/Platelet -     BMP8+EGFR  Nausea Side effects of augmentin. Zofran prn. Discussed  hydrating with nausea in detail. Discussed if he does not increase fluid intake and BP remains low, he will need to return to the ER for fluids.  -     ondansetron (ZOFRAN) 4 MG tablet; Take 1 tablet (4 mg total) by mouth every 8 (eight) hours as needed for nausea or vomiting. -     CBC with Differential/Platelet -     BMP8+EGFR  Essential (primary) hypertension Soft BP today. Hold lasix dosage as he has not taken this today. Discussed importance of hydration. Keep a close eye on BP at home and notify for low readings. Check BP tomorrow prior to giving BP meds.  -     CBC with Differential/Platelet -     BMP8+EGFR  Stage 3a chronic kidney disease ([place]) Will check labs as below today.  -     CBC with Differential/Platelet -     BMP8+EGFR   Return if symptoms worsen or fail to improve.  The patient indicates understanding of these issues and agrees with the plan.    Gwenlyn Perking, FNP

## 2022-06-19 NOTE — Patient Instructions (Signed)

## 2022-06-20 LAB — CBC WITH DIFFERENTIAL/PLATELET
Basophils Absolute: 0 10*3/uL (ref 0.0–0.2)
Basos: 0 %
EOS (ABSOLUTE): 0.2 10*3/uL (ref 0.0–0.4)
Eos: 2 %
Hematocrit: 47.7 % (ref 37.5–51.0)
Hemoglobin: 16.3 g/dL (ref 13.0–17.7)
Immature Grans (Abs): 0 10*3/uL (ref 0.0–0.1)
Immature Granulocytes: 0 %
Lymphocytes Absolute: 1.4 10*3/uL (ref 0.7–3.1)
Lymphs: 13 %
MCH: 32.5 pg (ref 26.6–33.0)
MCHC: 34.2 g/dL (ref 31.5–35.7)
MCV: 95 fL (ref 79–97)
Monocytes Absolute: 0.8 10*3/uL (ref 0.1–0.9)
Monocytes: 7 %
Neutrophils Absolute: 8.7 10*3/uL — ABNORMAL HIGH (ref 1.4–7.0)
Neutrophils: 78 %
Platelets: 215 10*3/uL (ref 150–450)
RBC: 5.01 x10E6/uL (ref 4.14–5.80)
RDW: 13.3 % (ref 11.6–15.4)
WBC: 11.2 10*3/uL — ABNORMAL HIGH (ref 3.4–10.8)

## 2022-06-20 LAB — BMP8+EGFR
BUN/Creatinine Ratio: 12 (ref 10–24)
BUN: 17 mg/dL (ref 8–27)
CO2: 19 mmol/L — ABNORMAL LOW (ref 20–29)
Calcium: 9.3 mg/dL (ref 8.6–10.2)
Chloride: 108 mmol/L — ABNORMAL HIGH (ref 96–106)
Creatinine, Ser: 1.41 mg/dL — ABNORMAL HIGH (ref 0.76–1.27)
Glucose: 107 mg/dL — ABNORMAL HIGH (ref 70–99)
Potassium: 4.5 mmol/L (ref 3.5–5.2)
Sodium: 143 mmol/L (ref 134–144)
eGFR: 48 mL/min/{1.73_m2} — ABNORMAL LOW (ref >59)

## 2022-07-10 ENCOUNTER — Ambulatory Visit (INDEPENDENT_AMBULATORY_CARE_PROVIDER_SITE_OTHER): Payer: Medicare Other | Admitting: Family Medicine

## 2022-07-10 ENCOUNTER — Encounter: Payer: Self-pay | Admitting: Family Medicine

## 2022-07-10 VITALS — BP 124/63 | HR 67 | Temp 98.1°F | Ht 68.0 in | Wt 182.8 lb

## 2022-07-10 DIAGNOSIS — R0981 Nasal congestion: Secondary | ICD-10-CM

## 2022-07-10 DIAGNOSIS — E785 Hyperlipidemia, unspecified: Secondary | ICD-10-CM | POA: Diagnosis not present

## 2022-07-10 DIAGNOSIS — R350 Frequency of micturition: Secondary | ICD-10-CM

## 2022-07-10 DIAGNOSIS — N401 Enlarged prostate with lower urinary tract symptoms: Secondary | ICD-10-CM

## 2022-07-10 DIAGNOSIS — N1831 Chronic kidney disease, stage 3a: Secondary | ICD-10-CM | POA: Diagnosis not present

## 2022-07-10 DIAGNOSIS — I10 Essential (primary) hypertension: Secondary | ICD-10-CM

## 2022-07-10 DIAGNOSIS — Z8639 Personal history of other endocrine, nutritional and metabolic disease: Secondary | ICD-10-CM

## 2022-07-10 DIAGNOSIS — M1990 Unspecified osteoarthritis, unspecified site: Secondary | ICD-10-CM

## 2022-07-10 MED ORDER — FOLBEE 2.5-25-1 MG PO TABS: ORAL_TABLET | ORAL | 1 refills | Status: DC

## 2022-07-10 MED ORDER — DOXAZOSIN MESYLATE 8 MG PO TABS: ORAL_TABLET | ORAL | 1 refills | Status: DC

## 2022-07-10 MED ORDER — METOPROLOL TARTRATE 25 MG PO TABS: 25.0000 mg | ORAL_TABLET | Freq: Every day | ORAL | 1 refills | Status: DC

## 2022-07-10 MED ORDER — LEVOCETIRIZINE DIHYDROCHLORIDE 5 MG PO TABS: 5.0000 mg | ORAL_TABLET | Freq: Every evening | ORAL | 1 refills | Status: DC

## 2022-07-10 MED ORDER — SIMVASTATIN 20 MG PO TABS: 20.0000 mg | ORAL_TABLET | Freq: Every day | ORAL | 1 refills | Status: DC

## 2022-07-10 MED ORDER — FUROSEMIDE 20 MG PO TABS: 20.0000 mg | ORAL_TABLET | Freq: Every day | ORAL | 1 refills | Status: DC

## 2022-07-10 NOTE — Progress Notes (Unsigned)
Assessment & Plan:  1. Essential (primary) hypertension Well controlled on current regimen.  - furosemide (LASIX) 20 MG tablet; Take 1 tablet (20 mg total) by mouth daily.  Dispense: 90 tablet; Refill: 1 - metoprolol tartrate (LOPRESSOR) 25 MG tablet; Take 1 tablet (25 mg total) by mouth daily.  Dispense: 90 tablet; Refill: 1  2. Dyslipidemia Well controlled on current regimen.  - simvastatin (ZOCOR) 20 MG tablet; Take 1 tablet (20 mg total) by mouth at bedtime.  Dispense: 90 tablet; Refill: 1  3. Stage 3a chronic kidney disease (H[facility]) Stable.  4. Benign prostatic hyperplasia with urinary frequency Well controlled on current regimen.  - doxazosin (CARDURA) 8 MG tablet; 1 tablet daily  Dispense: 90 tablet; Refill: 1  5. Arthritis Well controlled on current regimen.   6. History of vitamin D deficiency Well controlled on current regimen.   7. Head congestion - levocetirizine (XYZAL) 5 MG tablet; Take 1 tablet (5 mg total) by mouth every evening.  Dispense: 90 tablet; Refill: 1   Follow-up: Return in about 4 months (around 11/09/2022) for follow-up of chronic medication conditions.   Mitchell Limes, MSN, APRN, FNP-C [facility] 07/10/22  Subjective:  Patient ID: [name], male    DOB: 1932/11/03  Age: 88 y.o. MRN: [id number]  Patient Care Team: Loman Brooklyn, FNP as PCP - General (Family Medicine) Alexis Frock, MD as Consulting Physician (Urology)   CC:  Chief Complaint  Patient presents with   Annual Exam   Nasal Congestion    Patient states it has been going on for months.     HPI Mitchell Chandler presents for follow up for chronic medical conditions.  Patient is accompanied by his daughter, who he is okay with being present.  Occupation: retired, Marital status: married, Substance use: no Diet: regular, Exercise: minimal Last eye exam: 08/2019 Last dental exam: pre-COVID, but is established  PSA: 06/2020 Immunizations:  Flu Vaccine:  not yet available Tdap Vaccine: declined  Shingrix Vaccine: declined  COVID-19 Vaccine:  declined booster Pneumonia Vaccine: up to date  DEPRESSION SCREENING    07/10/2022    1:31 PM 06/19/2022   10:37 AM 01/09/2022    2:05 PM 07/13/2021    4:50 PM 07/05/2021    2:11 PM 04/04/2021    1:39 PM 01/10/2021   10:32 AM  PHQ 2/9 Scores  PHQ - 2 Score 3 1 0 0 0 0 0  PHQ- 9 Score _0 Hypertension: Patient does monitor his blood pressures at home. He has been keeping a log where he is checking multiple times per day. He noticed when his blood pressures were on the lower side (low 100s/50s) he didn't feel very well, so he cut himself back on his blood pressure medication. Afterwards, his readings came up to the 110-120s/60s and he reports he feels much better. He denies any chest pain, headaches, dizziness or lightheadedness.   Dyslipidemia: taking simvastatin.   BPH: patient saw Dr. Tresa Moore 02/25/2022 at which time a 1 year follow-up was recommended.   CKD: avoiding NSAIDs.    Arthritis: takes Tylenol as needed.    Vitamin D deficiency: last vitamin D level normal six months ago.   New concerns: He is concerned about nasal congestion for the past few months. He has clear drainage with his runny nose and his ears feel stopped up. Denies sneezing and itchy/watery eyes.   Review of Systems  Constitutional:  Positive  for malaise/fatigue. Negative for chills, fever and weight loss.  HENT:  Positive for congestion. Negative for ear discharge, ear pain, nosebleeds, sinus pain, sore throat and tinnitus.   Eyes:  Negative for blurred vision, double vision, pain, discharge and redness.  Respiratory:  Negative for cough, shortness of breath and wheezing.   Cardiovascular:  Negative for chest pain, palpitations and leg swelling.  Gastrointestinal:  Negative for abdominal pain, constipation, diarrhea, heartburn, nausea and vomiting.  Genitourinary:  Negative for dysuria, frequency and urgency.   Musculoskeletal:  Negative for myalgias.  Skin:  Negative for rash.  Neurological:  Negative for dizziness, seizures, weakness and headaches.  Psychiatric/Behavioral:  Negative for depression, substance abuse and suicidal ideas. The patient is not nervous/anxious.      Current Outpatient Medications:    acetaminophen (TYLENOL) 500 MG tablet, Take 500-1,000 mg by mouth every 6 (six) hours as needed (for arthritis pain (neck/shoulder)). , Disp: , Rfl:    aspirin 81 MG EC tablet, Take 81 mg by mouth daily. Swallow whole., Disp: , Rfl:    doxazosin (CARDURA) 8 MG tablet, 1 tablet daily, Disp: 90 tablet, Rfl: 1   Folic Acid-Vit J0-ZES P23 (FOLBEE) 2.5-25-1 MG TABS tablet, 1 tablet daily, Disp: 90 tablet, Rfl: 1   furosemide (LASIX) 20 MG tablet, Take 1 tablet (20 mg total) by mouth daily., Disp: 90 tablet, Rfl: 1   metoprolol tartrate (LOPRESSOR) 50 MG tablet, Take 1 tablet (50 mg total) by mouth daily. (Patient taking differently: Take 25 mg by mouth daily.), Disp: 90 tablet, Rfl: 1   ondansetron (ZOFRAN) 4 MG tablet, Take 1 tablet (4 mg total) by mouth every 8 (eight) hours as needed for nausea or vomiting., Disp: 20 tablet, Rfl: 0   simvastatin (ZOCOR) 20 MG tablet, Take 1 tablet (20 mg total) by mouth at bedtime., Disp: 90 tablet, Rfl: 1   amLODipine-benazepril (LOTREL) 5-10 MG capsule, Take 1 capsule by mouth daily. (Patient not taking: Reported on 07/10/2022), Disp: 90 capsule, Rfl: 1  No Known Allergies  Past Medical History:  Diagnosis Date   Benign prostatic hyperplasia 12/25/2016   Heart block AV complete ([place]) oct 1995   surgery at [facility] 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 Yearly surviellance.    Renal stone 01/26/2020   Vitamin D deficiency 08/13/2020    Past Surgical History:  Procedure Laterality Date   COLONOSCOPY  10/02/2011   Procedure:  COLONOSCOPY;  Surgeon: Rogene Houston, MD;  Location: [facility];  Service: Endoscopy;  Laterality: N/A;  10:30 am   COLONOSCOPY N/A 12/21/2014   Procedure: COLONOSCOPY;  Surgeon: Rogene Houston, MD;  Location: [facility];  Service: Endoscopy;  Laterality: N/A;  1030   CORONARY ARTERY BYPASS GRAFT  1995   CYSTOSCOPY W/ URETERAL STENT PLACEMENT Left 01/26/2020   Procedure: CYSTOSCOPY WITH RETROGRADE PYELOGRAM/URETERAL STENT PLACEMENT;  Surgeon: Alexis Frock, MD;  Location: [facility];  Service: Urology;  Laterality: Left;   CYSTOSCOPY WITH RETROGRADE PYELOGRAM, URETEROSCOPY AND STENT PLACEMENT Left 11/19/2019   Procedure: CYSTOSCOPY WITH RETROGRADE PYELOGRAM, DIAGNOSTIC URETEROSCOPY AND STENT PLACEMENT;  Surgeon: Alexis Frock, MD;  Location: [facility];  Service: Urology;  Laterality: Left;  1   EYE SURGERY     feb 7 feb 28 [facility]   KIDNEY STONE SURGERY     NEPHROLITHOTOMY Left 01/26/2020  Procedure: NEPHROLITHOTOMY PERCUTANEOUS;  Surgeon: Alexis Frock, MD;  Location: [facility];  Service: Urology;  Laterality: Left;  3 HRS   PROSTATE BIOPSY      Family History  Problem Relation Age of Onset   Heart disease Mother    Hypertension Father    Hypertension Brother    Hypertension Brother    Hypertension Brother    Hypertension Brother     Social History   Socioeconomic History   Marital status: Married    Spouse name: Rod Holler   Number of children: 1   Years of education: Not on file   Highest education level: Not on file  Occupational History   Occupation: retired  Tobacco Use   Smoking status: Former    Types: Cigarettes    Quit date: 12/21/1962    Years since quitting: 59.5   Smokeless tobacco: Never  Vaping Use   Vaping Use: Never used  Substance and Sexual Activity   Alcohol use: No   Drug use: No   Sexual activity: Not on file  Other Topics Concern   Not on file  Social History Narrative   Lives home with his wife, Rod Holler - his daughter and  grandchildren check on them 3 times a day. He stays active, mowing, yard work, eating out with friends, etc.   Social Determinants of Health   Financial Resource Strain: Low Risk  (07/13/2021)   Overall Financial Resource Strain (CARDIA)    Difficulty of Paying Living Expenses: Not hard at all  Food Insecurity: No Food Insecurity (07/13/2021)   Hunger Vital Sign    Worried About Running Out of Food in the Last Year: Never true    [place] in the Last Year: Never true  Transportation Needs: No Transportation Needs (07/13/2021)   PRAPARE - Hydrologist (Medical): No    Lack of Transportation (Non-Medical): No  Physical Activity: Insufficiently Active (07/13/2021)   Exercise Vital Sign    Days of Exercise per Week: 7 days    Minutes of Exercise per Session: 20 min  Stress: No Stress Concern Present (07/13/2021)   [place]    Feeling of Stress : Not at all  Social Connections: [place] (07/13/2021)   Social Connection and Isolation Panel [NHANES]    Frequency of Communication with Friends and Family: More than three times a week    Frequency of Social Gatherings with Friends and Family: More than three times a week    Attends Religious Services: More than 4 times per year    Active Member of [employer] or Organizations: Yes    Attends Archivist Meetings: More than 4 times per year    Marital Status: Married  Human resources officer Violence: Not At Risk (07/13/2021)   Humiliation, Afraid, Rape, and Kick questionnaire    Fear of Current or Ex-Partner: No    Emotionally Abused: No    Physically Abused: No    Sexually Abused: No      Objective:    BP 124/63   Pulse 67   Temp 98.1 F (36.7 C) (Temporal)   Ht $R'5\' 8"'zh$  (1.727 m)   Wt 182 lb 12.8 oz (82.9 kg)   SpO2 95%   BMI 27.79 kg/m   Wt Readings from Last 3 Encounters:  07/10/22 182 lb 12.8 oz (82.9 kg)  06/19/22 185  lb (83.9 kg)  06/14/22 180 lb (81.6 kg)    Physical Exam  Vitals reviewed.  Constitutional:      Appearance: Normal appearance. He is overweight.  HENT:     Head: Normocephalic.     Right Ear: Tympanic membrane, ear canal and external ear normal.     Left Ear: Tympanic membrane, ear canal and external ear normal.     Nose: Nose normal.     Mouth/Throat:     Mouth: Mucous membranes are moist.     Pharynx: Oropharynx is clear.  Eyes:     Extraocular Movements: Extraocular movements intact.     Conjunctiva/sclera: Conjunctivae normal.     Pupils: Pupils are equal, round, and reactive to light.  Cardiovascular:     Rate and Rhythm: Normal rate and regular rhythm.     Pulses: Normal pulses.     Heart sounds: Normal heart sounds.  Pulmonary:     Effort: Pulmonary effort is normal.     Breath sounds: Normal breath sounds.  Abdominal:     General: Bowel sounds are normal.     Palpations: Abdomen is soft.  Musculoskeletal:        General: Normal range of motion.     Cervical back: Normal range of motion.  Skin:    General: Skin is warm and dry.  Neurological:     General: No focal deficit present.     Mental Status: He is alert and oriented to person, place, and time. Mental status is at baseline.     No results found for: "TSH" Lab Results  Component Value Date   WBC 11.2 (H) 06/19/2022   HGB 16.3 06/19/2022   HCT 47.7 06/19/2022   MCV 95 06/19/2022   PLT 215 06/19/2022   Lab Results  Component Value Date   NA 143 06/19/2022   K 4.5 06/19/2022   CO2 19 (L) 06/19/2022   GLUCOSE 107 (H) 06/19/2022   BUN 17 06/19/2022   CREATININE 1.41 (H) 06/19/2022   BILITOT 1.0 06/14/2022   ALKPHOS 59 06/14/2022   AST 23 06/14/2022   ALT 16 06/14/2022   PROT 7.3 06/14/2022   ALBUMIN 4.1 06/14/2022   CALCIUM 9.3 06/19/2022   ANIONGAP 7 06/14/2022   EGFR 48 (L) 06/19/2022   Lab Results  Component Value Date   CHOL 159 01/09/2022   Lab Results  Component Value Date   HDL  50 01/09/2022   Lab Results  Component Value Date   LDLCALC 70 01/09/2022   Lab Results  Component Value Date   TRIG 240 (H) 01/09/2022   Lab Results  Component Value Date   CHOLHDL 3.2 01/09/2022   No results found for: "HGBA1C"

## 2022-07-16 ENCOUNTER — Ambulatory Visit (INDEPENDENT_AMBULATORY_CARE_PROVIDER_SITE_OTHER): Payer: Medicare Other

## 2022-07-16 VITALS — Wt 182.0 lb

## 2022-07-16 DIAGNOSIS — Z0000 Encounter for general adult medical examination without abnormal findings: Secondary | ICD-10-CM | POA: Diagnosis not present

## 2022-07-16 NOTE — Patient Instructions (Signed)
Mr. Mitchell Chandler , Thank you for taking time to come for your Medicare Wellness Visit. I appreciate your ongoing commitment to your health goals. Please review the following plan we discussed and let me know if I can assist you in the future.   Screening recommendations/referrals: Colonoscopy: 12/21/2014 - no longer required Recommended yearly ophthalmology/optometry visit for glaucoma screening and checkup Recommended yearly dental visit for hygiene and checkup  Vaccinations: Influenza vaccine: Done 09/06/2021 - Repeat annually  Pneumococcal vaccine: Done 2008 & 2017 Tdap vaccine: Declined - recommended every 10 years Shingles vaccine: Declined - Shingrix is 2 doses 2-6 months apart and over 90% effective     Covid-19: Done  01/05/2020, 02/02/2020, 10/03/2020  Advanced directives: Please bring a copy of your health care power of attorney and living will to the office to be added to your chart at your convenience.   Conditions/risks identified: Each day, aim for 6 glasses of water, plenty of protein in your diet and try to get up and walk/ stretch every hour for 5-10 minutes at a time.    Next appointment: Follow up in one year for your annual wellness visit.   Preventive Care 37 Years and Older, Male  Preventive care refers to lifestyle choices and visits with your health care provider that can promote health and wellness. What does preventive care include? A yearly physical exam. This is also called an annual well check. Dental exams once or twice a year. Routine eye exams. Ask your health care provider how often you should have your eyes checked. Personal lifestyle choices, including: Daily care of your teeth and gums. Regular physical activity. Eating a healthy diet. Avoiding tobacco and drug use. Limiting alcohol use. Practicing safe sex. Taking low doses of aspirin every day. Taking vitamin and mineral supplements as recommended by your health care provider. What happens during an  annual well check? The services and screenings done by your health care provider during your annual well check will depend on your age, overall health, lifestyle risk factors, and family history of disease. Counseling  Your health care provider may ask you questions about your: Alcohol use. Tobacco use. Drug use. Emotional well-being. Home and relationship well-being. Sexual activity. Eating habits. History of falls. Memory and ability to understand (cognition). Work and work Statistician. Screening  You may have the following tests or measurements: Height, weight, and BMI. Blood pressure. Lipid and cholesterol levels. These may be checked every 5 years, or more frequently if you are over 79 years old. Skin check. Lung cancer screening. You may have this screening every year starting at age 39 if you have a 30-pack-year history of smoking and currently smoke or have quit within the past 15 years. Fecal occult blood test (FOBT) of the stool. You may have this test every year starting at age 25. Flexible sigmoidoscopy or colonoscopy. You may have a sigmoidoscopy every 5 years or a colonoscopy every 10 years starting at age 62. Prostate cancer screening. Recommendations will vary depending on your family history and other risks. Hepatitis C blood test. Hepatitis B blood test. Sexually transmitted disease (STD) testing. Diabetes screening. This is done by checking your blood sugar (glucose) after you have not eaten for a while (fasting). You may have this done every 1-3 years. Abdominal aortic aneurysm (AAA) screening. You may need this if you are a current or former smoker. Osteoporosis. You may be screened starting at age 39 if you are at high risk. Talk with your health care provider about  your test results, treatment options, and if necessary, the need for more tests. Vaccines  Your health care provider may recommend certain vaccines, such as: Influenza vaccine. This is recommended  every year. Tetanus, diphtheria, and acellular pertussis (Tdap, Td) vaccine. You may need a Td booster every 10 years. Zoster vaccine. You may need this after age 34. Pneumococcal 13-valent conjugate (PCV13) vaccine. One dose is recommended after age 21. Pneumococcal polysaccharide (PPSV23) vaccine. One dose is recommended after age 55. Talk to your health care provider about which screenings and vaccines you need and how often you need them. This information is not intended to replace advice given to you by your health care provider. Make sure you discuss any questions you have with your health care provider. Document Released: 12/15/2015 Document Revised: 08/07/2016 Document Reviewed: 09/19/2015 Elsevier Interactive Patient Education  2017 [place] Prevention in the Home Falls can cause injuries. They can happen to people of all ages. There are many things you can do to make your home safe and to help prevent falls. What can I do on the outside of my home? Regularly fix the edges of walkways and driveways and fix any cracks. Remove anything that might make you trip as you walk through a door, such as a raised step or threshold. Trim any bushes or trees on the path to your home. Use bright outdoor lighting. Clear any walking paths of anything that might make someone trip, such as rocks or tools. Regularly check to see if handrails are loose or broken. Make sure that both sides of any steps have handrails. Any raised decks and porches should have guardrails on the edges. Have any leaves, snow, or ice cleared regularly. Use sand or salt on walking paths during winter. Clean up any spills in your garage right away. This includes oil or grease spills. What can I do in the bathroom? Use night lights. Install grab bars by the toilet and in the tub and shower. Do not use towel bars as grab bars. Use non-skid mats or decals in the tub or shower. If you need to sit down in the shower,  use a plastic, non-slip stool. Keep the floor dry. Clean up any water that spills on the floor as soon as it happens. Remove soap buildup in the tub or shower regularly. Attach bath mats securely with double-sided non-slip rug tape. Do not have throw rugs and other things on the floor that can make you trip. What can I do in the bedroom? Use night lights. Make sure that you have a light by your bed that is easy to reach. Do not use any sheets or blankets that are too big for your bed. They should not hang down onto the floor. Have a firm chair that has side arms. You can use this for support while you get dressed. Do not have throw rugs and other things on the floor that can make you trip. What can I do in the kitchen? Clean up any spills right away. Avoid walking on wet floors. Keep items that you use a lot in easy-to-reach places. If you need to reach something above you, use a strong step stool that has a grab bar. Keep electrical cords out of the way. Do not use floor polish or wax that makes floors slippery. If you must use wax, use non-skid floor wax. Do not have throw rugs and other things on the floor that can make you trip. What can I do with  my stairs? Do not leave any items on the stairs. Make sure that there are handrails on both sides of the stairs and use them. Fix handrails that are broken or loose. Make sure that handrails are as long as the stairways. Check any carpeting to make sure that it is firmly attached to the stairs. Fix any carpet that is loose or worn. Avoid having throw rugs at the top or bottom of the stairs. If you do have throw rugs, attach them to the floor with carpet tape. Make sure that you have a light switch at the top of the stairs and the bottom of the stairs. If you do not have them, ask someone to add them for you. What else can I do to help prevent falls? Wear shoes that: Do not have high heels. Have rubber bottoms. Are comfortable and fit you  well. Are closed at the toe. Do not wear sandals. If you use a stepladder: Make sure that it is fully opened. Do not climb a closed stepladder. Make sure that both sides of the stepladder are locked into place. Ask someone to hold it for you, if possible. Clearly mark and make sure that you can see: Any grab bars or handrails. First and last steps. Where the edge of each step is. Use tools that help you move around (mobility aids) if they are needed. These include: Canes. Walkers. Scooters. Crutches. Turn on the lights when you go into a dark area. Replace any light bulbs as soon as they burn out. Set up your furniture so you have a clear path. Avoid moving your furniture around. If any of your floors are uneven, fix them. If there are any pets around you, be aware of where they are. Review your medicines with your doctor. Some medicines can make you feel dizzy. This can increase your chance of falling. Ask your doctor what other things that you can do to help prevent falls. This information is not intended to replace advice given to you by your health care provider. Make sure you discuss any questions you have with your health care provider. Document Released: 09/14/2009 Document Revised: 04/25/2016 Document Reviewed: 12/23/2014 Elsevier Interactive Patient Education  2017 [employer].

## 2022-07-16 NOTE — Progress Notes (Signed)
Subjective:   Mitchell Chandler is a 88 y.o. male who presents for Medicare Annual/Subsequent preventive examination.  Virtual Visit via Telephone Note  I connected with  Mitchell Chandler on 07/16/22 at 10:30 AM EDT by telephone and verified that I am speaking with the correct person using two identifiers.  Location: Patient: Home Provider: [facility] Persons participating in the virtual visit: patient/Nurse Health Advisor   I discussed the limitations, risks, security and privacy concerns of performing an evaluation and management service by telephone and the availability of in person appointments. The patient expressed understanding and agreed to proceed.  Interactive audio and video telecommunications were attempted between this nurse and patient, however failed, due to patient having technical difficulties OR patient did not have access to video capability.  We continued and completed visit with audio only.  Some vital signs may be absent or patient reported.   Misa Fedorko E Coy Vandoren, LPN   Review of Systems     Cardiac Risk Factors include: advanced age (>58mn, >>56women);male gender;dyslipidemia;hypertension;sedentary lifestyle     Objective:    Today's Vitals   07/16/22 1043  Weight: 182 lb (82.6 kg)   Body mass index is 27.67 kg/m.     07/16/2022   10:48 AM 06/14/2022   10:18 AM 07/13/2021    4:59 PM 08/08/2020   10:05 AM 08/02/2020   10:34 AM 01/26/2020    5:00 PM 01/26/2020   10:18 AM  Advanced Directives  Does Patient Have a Medical Advance Directive? Yes No Yes No No No No  Type of [place]amed[place] will  [place] will      Copy of HThorin Chart? No - copy requested  No - copy requested      Would patient like information on creating a medical advance directive?  No - Patient declined   Yes (ED - Information included in AVS) No - Patient declined No - Patient declined    Current Medications  (verified) Outpatient Encounter Medications as of 07/16/2022  Medication Sig   acetaminophen (TYLENOL) 500 MG tablet Take 500-1,000 mg by mouth every 6 (six) hours as needed (for arthritis pain (neck/shoulder)).    aspirin 81 MG EC tablet Take 81 mg by mouth daily. Swallow whole.   doxazosin (CARDURA) 8 MG tablet 1 tablet daily   Folic Acid-Vit BS4-HQPBR91(FOLBEE) 2.5-25-1 MG TABS tablet 1 tablet daily   furosemide (LASIX) 20 MG tablet Take 1 tablet (20 mg total) by mouth daily.   levocetirizine (XYZAL) 5 MG tablet Take 1 tablet (5 mg total) by mouth every evening.   metoprolol tartrate (LOPRESSOR) 25 MG tablet Take 1 tablet (25 mg total) by mouth daily.   ondansetron (ZOFRAN) 4 MG tablet Take 1 tablet (4 mg total) by mouth every 8 (eight) hours as needed for nausea or vomiting.   simvastatin (ZOCOR) 20 MG tablet Take 1 tablet (20 mg total) by mouth at bedtime.   No facility-administered encounter medications on file as of 07/16/2022.    Allergies (verified) Patient has no known allergies.   History: Past Medical History:  Diagnosis Date   Benign prostatic hyperplasia 12/25/2016   Heart block AV complete ([facility] oct 1995   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 Yearly surviellance.  Renal stone 01/26/2020   Vitamin D deficiency 08/13/2020   Past Surgical History:  Procedure Laterality Date   COLONOSCOPY  10/02/2011   Procedure: COLONOSCOPY;  Surgeon: Rogene Houston, MD;  [facility];  Service: Endoscopy;  Laterality: N/A;  10:30 am   COLONOSCOPY N/A 12/21/2014   Procedure: COLONOSCOPY;  Surgeon: Rogene Houston, MD;  [facility];  Service: Endoscopy;  Laterality: N/A;  1030   CORONARY ARTERY BYPASS GRAFT  1995   CYSTOSCOPY W/ URETERAL STENT PLACEMENT Left 01/26/2020   Procedure: CYSTOSCOPY WITH RETROGRADE  PYELOGRAM/URETERAL STENT PLACEMENT;  Surgeon: Alexis Frock, MD;  Location: [facility];  Service: Urology;  Laterality: Left;   CYSTOSCOPY WITH RETROGRADE PYELOGRAM, URETEROSCOPY AND STENT PLACEMENT Left 11/19/2019   Procedure: CYSTOSCOPY WITH RETROGRADE PYELOGRAM, DIAGNOSTIC URETEROSCOPY AND STENT PLACEMENT;  Surgeon: Alexis Frock, MD;  [facility];  Service: Urology;  Laterality: Left;  1   EYE SURGERY     feb 7 feb 28 [facility]   KIDNEY STONE SURGERY     NEPHROLITHOTOMY Left 01/26/2020   Procedure: NEPHROLITHOTOMY PERCUTANEOUS;  Surgeon: Alexis Frock, MD;  Location: [facility];  Service: Urology;  Laterality: Left;  3 HRS   PROSTATE BIOPSY     Family History  Problem Relation Age of Onset   Heart disease Mother    Hypertension Father    Hypertension Brother    Hypertension Brother    Hypertension Brother    Hypertension Brother    Social History   Socioeconomic History   Marital status: Married    Spouse name: Rod Holler   Number of children: 1   Years of education: Not on file   Highest education level: Not on file  Occupational History   Occupation: retired  Tobacco Use   Smoking status: Former    Types: Cigarettes    Quit date: 12/21/1962    Years since quitting: 59.6   Smokeless tobacco: Never  Vaping Use   Vaping Use: Never used  Substance and Sexual Activity   Alcohol use: No   Drug use: No   Sexual activity: Not on file  Other Topics Concern   Not on file  Social History Narrative   He and his wife Rod Holler are living with their daughter. He stays active, mowing, yard work, eating out with friends, etc.   Social Determinants of Health   Financial Resource Strain: Low Risk  (07/16/2022)   Overall Financial Resource Strain (CARDIA)    Difficulty of Paying Living Expenses: Not hard at all  Food Insecurity: No Food Insecurity (07/16/2022)   Hunger Vital Sign    Worried About Running Out of Food in the Last Year: Never true    [place]  in the Last Year: Never true  Transportation Needs: No Transportation Needs (07/16/2022)   PRAPARE - Hydrologist (Medical): No    Lack of Transportation (Non-Medical): No  Physical Activity: Insufficiently Active (07/16/2022)   Exercise Vital Sign    Days of Exercise per Week: 7 days    Minutes of Exercise per Session: 10 min  Stress: No Stress Concern Present (07/16/2022)   [place]    Feeling of Stress : Not at all  Social Connections: Moderately Isolated (07/16/2022)   Social Connection and Isolation Panel [NHANES]    Frequency of Communication with Friends and Family: More than three times a week    Frequency of Social Gatherings  with Friends and Family: More than three times a week    Attends Religious Services: Never    Marine scientist or Organizations: No    Attends Music therapist: Never    Marital Status: Married    Tobacco Counseling Counseling given: Not Answered   Clinical Intake:  Pre-visit preparation completed: Yes  Pain : No/denies pain     BMI - recorded: 27.67 Nutritional Status: BMI 25 -29 Overweight Nutritional Risks: None Diabetes: No  How often do you need to have someone help you when you read instructions, pamphlets, or other written materials from your doctor or pharmacy?: 1 - Never  Diabetic? no  Interpreter Needed?: No  Information entered by :: Tniya Bowditch, LPN   Activities of Daily Living    07/16/2022   10:51 AM  In your present state of health, do you have any difficulty performing the following activities:  Hearing? 1  Vision? 0  Difficulty concentrating or making decisions? 0  Walking or climbing stairs? 0  Dressing or bathing? 0  Doing errands, shopping? 0  Preparing Food and eating ? N  Using the Toilet? N  In the past six months, have you accidently leaked urine? Y  Comment mild  Do you have problems with  loss of bowel control? N  Managing your Medications? N  Managing your Finances? N  Housekeeping or managing your Housekeeping? N    Patient Care Team: Loman Brooklyn, FNP as PCP - General (Family Medicine) Alexis Frock, MD as Consulting Physician (Urology)  Indicate any recent Medical Services you may have received from other than [facility] providers in the past year (date may be approximate).     Assessment:   This is a routine wellness examination for Adrin.  Hearing/Vision screen Hearing Screening - Comments:: C/o mild hearing difficulties  - thinks ears are stopped up - using flonase and allergy pill Vision Screening - Comments:: Wears rx glasses - behind with routine eye exams [facility] [place] issues and exercise activities discussed: Current Exercise Habits: Home exercise routine, Type of exercise: walking;stretching, Time (Minutes): 10, Frequency (Times/Week): 7, Weekly Exercise (Minutes/Week): 70, Intensity: Mild, Exercise limited by: orthopedic condition(s)   Goals Addressed             This Visit's Progress    LIFESTYLE - DECREASE FALLS RISK   On track      Depression Screen    07/16/2022   10:49 AM 07/10/2022    1:31 PM 06/19/2022   10:37 AM 01/09/2022    2:05 PM 07/13/2021    4:50 PM 07/05/2021    2:11 PM 04/04/2021    1:39 PM  PHQ 2/9 Scores  PHQ - 2 Score 0 3 1 0 0 0 0  PHQ- 9 Score 0 '11 10  2 3     '$ Fall Risk    07/16/2022   10:45 AM 07/10/2022    1:31 PM 06/19/2022   10:37 AM 01/09/2022    2:05 PM 07/13/2021    4:59 PM  Fall Risk   Falls in the past year? 0 0 0 0 0  Number falls in past yr: 0    0  Injury with Fall? 0    0  Risk for fall due to : Impaired balance/gait;Orthopedic patient    History of fall(s);Impaired balance/gait;Impaired vision  Follow up Falls prevention discussed;Education provided    Falls prevention discussed;Education provided    FALL RISK PREVENTION PERTAINING TO THE  HOME:  Any stairs in or around the home? Yes   If so, are there any without handrails? No  Home free of loose throw rugs in walkways, pet beds, electrical cords, etc? Yes  Adequate lighting in your home to reduce risk of falls? Yes   ASSISTIVE DEVICES UTILIZED TO PREVENT FALLS:  Life alert? No  Use of a cane, walker or w/c? Yes  Grab bars in the bathroom? Yes  Shower chair or bench in shower? Yes  Elevated toilet seat or a handicapped toilet? Yes   TIMED UP AND GO:  Was the test performed? No . Telephonic visit  Cognitive Function:        07/16/2022   10:52 AM 07/13/2021    5:00 PM  6CIT Screen  What Year? 0 points 0 points  What month? 0 points 0 points  What time? 0 points 0 points  Count back from 20 0 points 0 points  Months in reverse 0 points 0 points  Repeat phrase 0 points 2 points  Total Score 0 points 2 points    Immunizations Immunization History  Administered Date(s) Administered   Fluad Quad(high Dose 65+) 09/19/2017, 09/07/2018, 09/07/2020   Influenza, High Dose Seasonal PF 09/19/2017, 09/07/2018   Influenza,inj,Quad PF,6+ Mos 09/06/2021   Influenza-Unspecified 09/01/2016, 09/19/2017, 09/07/2018, 09/08/2019   Moderna Sars-Covid-2 Vaccination 01/05/2020, 02/02/2020, 10/03/2020   Pneumococcal Conjugate-13 12/02/2006   Pneumococcal-Unspecified 09/01/2016    TDAP status: Due, Education has been provided regarding the importance of this vaccine. Advised may receive this vaccine at local pharmacy or Health Dept. Aware to provide a copy of the vaccination record if obtained from local pharmacy or Health Dept. Verbalized acceptance and understanding.  Flu Vaccine status: Up to date  Pneumococcal vaccine status: Up to date  Covid-19 vaccine status: Completed vaccines  Qualifies for Shingles Vaccine? Yes   Zostavax completed No   Shingrix Completed?: No.    Education has been provided regarding the importance of this vaccine. Patient has been advised to call insurance company to determine out of pocket  expense if they have not yet received this vaccine. Advised may also receive vaccine at local pharmacy or Health Dept. Verbalized acceptance and understanding.  Screening Tests Health Maintenance  Topic Date Due   COVID-19 Vaccine (4 - Moderna series) 07/26/2022 (Originally 11/28/2020)   Zoster Vaccines- Shingrix (1 of 2) 10/10/2022 (Originally 04/17/1984)   Pneumonia Vaccine 75+ Years old (2 - PPSV23 or PCV20) 01/09/2023 (Originally 12/03/2007)   INFLUENZA VACCINE  03/02/2023 (Originally 07/02/2022)   TETANUS/TDAP  07/11/2023 (Originally 04/17/1951)   HPV VACCINES  Aged Out    Health Maintenance  There are no preventive care reminders to display for this patient.  Colorectal cancer screening: No longer required.   Lung Cancer Screening: (Low Dose CT Chest recommended if Age 60-80 years, 30 pack-year currently smoking OR have quit w/in 15years.) does not qualify.   Additional Screening:  Hepatitis C Screening: does not qualify  Vision Screening: Recommended annual ophthalmology exams for early detection of glaucoma and other disorders of the eye. Is the patient up to date with their annual eye exam?  No  Who is the provider or what is the name of the office in which the patient attends annual eye exams? [facility] If pt is not established with a provider, would they like to be referred to a provider to establish care? No .   Dental Screening: Recommended annual dental exams for proper oral hygiene  Community Resource Referral / Chronic  Care Management: CRR required this visit?  No   CCM required this visit?  No      Plan:     I have personally reviewed and noted the following in the patient's chart:   Medical and social history Use of alcohol, tobacco or illicit drugs  Current medications and supplements including opioid prescriptions. Patient is not currently taking opioid prescriptions. Functional ability and status Nutritional status Physical activity Advanced  directives List of other physicians Hospitalizations, surgeries, and ER visits in previous 12 months Vitals Screenings to include cognitive, depression, and falls Referrals and appointments  In addition, I have reviewed and discussed with patient certain preventive protocols, quality metrics, and best practice recommendations. A written personalized care plan for preventive services as well as general preventive health recommendations were provided to patient.     Sandrea Hammond, LPN   01/02[birth year]70   Nurse Notes: None

## 2022-10-10 ENCOUNTER — Ambulatory Visit (INDEPENDENT_AMBULATORY_CARE_PROVIDER_SITE_OTHER): Payer: Medicare Other | Admitting: Family Medicine

## 2022-10-10 ENCOUNTER — Encounter: Payer: Self-pay | Admitting: Family Medicine

## 2022-10-10 VITALS — BP 157/71 | HR 74 | Temp 98.8°F | Ht 68.0 in | Wt 184.0 lb

## 2022-10-10 DIAGNOSIS — E785 Hyperlipidemia, unspecified: Secondary | ICD-10-CM | POA: Diagnosis not present

## 2022-10-10 DIAGNOSIS — M1990 Unspecified osteoarthritis, unspecified site: Secondary | ICD-10-CM | POA: Diagnosis not present

## 2022-10-10 DIAGNOSIS — J310 Chronic rhinitis: Secondary | ICD-10-CM

## 2022-10-10 DIAGNOSIS — R350 Frequency of micturition: Secondary | ICD-10-CM

## 2022-10-10 DIAGNOSIS — N401 Enlarged prostate with lower urinary tract symptoms: Secondary | ICD-10-CM

## 2022-10-10 DIAGNOSIS — N1831 Chronic kidney disease, stage 3a: Secondary | ICD-10-CM

## 2022-10-10 DIAGNOSIS — Z23 Encounter for immunization: Secondary | ICD-10-CM

## 2022-10-10 DIAGNOSIS — M4722 Other spondylosis with radiculopathy, cervical region: Secondary | ICD-10-CM

## 2022-10-10 DIAGNOSIS — I10 Essential (primary) hypertension: Secondary | ICD-10-CM

## 2022-10-10 DIAGNOSIS — M25552 Pain in left hip: Secondary | ICD-10-CM

## 2022-10-10 DIAGNOSIS — R202 Paresthesia of skin: Secondary | ICD-10-CM

## 2022-10-10 NOTE — Progress Notes (Signed)
Established Patient Office Visit  Subjective   Patient ID: Mitchell Chandler, male    DOB: 1932-06-16  Age: 88 y.o. MRN: [id number]  Chief Complaint  Patient presents with   Medical Management of Chronic Issues    HPI Here with daughter today. He is now living with her.   HTN Current Medications - lasix as needed for peripheral edema. Stopped metoprolol and amlodipine due to low BPs Checking BP at home ranging 120s/60s, checking TID.  Pertinent ROS:  Visual Disturbances - No Chest pain - No Dyspnea - No Palpitations - No LE edema - No They report good compliance with medications and can restate their regimen by memory. No medication side effects.  2. Allergic rhinitis Takes flonase and xyzal daily for allergic rhinitis.   3. Hip pain Reports pain in his left hip in the morning. Improves with movement. He has been taking tylenol with good relief.   4. Finger numbness Reports some intermittent numbness in the fourth and fifth finger of his right hand. Denies decreased ROM or strength. He does have a history of cervical DDD.   5. BPH He sees urology yearly. Currently well controlled with doxazosin.   BP Readings from Last 3 Encounters:  10/10/22 (!) 157/71  07/10/22 124/63  06/19/22 (!) 95/48      Latest Ref Rng & Units 06/19/2022   11:51 AM 06/14/2022   10:47 AM 01/18/2022   10:33 AM  CMP  Glucose 70 - 99 mg/dL 107  92  89   BUN 8 - 27 mg/dL _0 Creatinine 0.76 - 1.27 mg/dL 1.41  1.20  1.33   Sodium 134 - 144 mmol/L 143  139  141   Potassium 3.5 - 5.2 mmol/L 4.5  4.3  4.8   Chloride 96 - 106 mmol/L 108  108  106   CO2 20 - 29 mmol/L _1 Calcium 8.6 - 10.2 mg/dL 9.3  9.2  9.5   Total Protein 6.5 - 8.1 g/dL  7.3    Total Bilirubin 0.3 - 1.2 mg/dL  1.0    Alkaline Phos 38 - 126 U/L  59    AST 15 - 41 U/L  23    ALT 0 - 44 U/L  16       Past Medical History:  Diagnosis Date   Benign prostatic hyperplasia 12/25/2016   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 Yearly surviellance.    Renal stone 01/26/2020   Vitamin D deficiency 08/13/2020      ROS Negative unless specially indicated above in HPI.   Objective:     BP (!) 157/71   Pulse 74   Temp 98.8 F (37.1 C)   Ht _2  (1.727 m)   Wt 184 lb (83.5 kg)   SpO2 96%   BMI 27.98 kg/m  BP Readings from Last 3 Encounters:  10/10/22 (!) 157/71  07/10/22 124/63  06/19/22 (!) 95/48      Physical Exam Vitals and nursing note reviewed.  Constitutional:      General: He is not in acute distress.    Appearance: He is not ill-appearing, toxic-appearing or diaphoretic.  HENT:     Head: Normocephalic and atraumatic.  Right Ear: Tympanic membrane, ear canal and external ear normal.     Left Ear: Tympanic membrane, ear canal and external ear normal.     Nose: Nose normal.     Mouth/Throat:     Mouth: Mucous membranes are moist.     Pharynx: Oropharynx is clear.  Neck:     Thyroid: No thyroid mass, thyromegaly or thyroid tenderness.  Cardiovascular:     Rate and Rhythm: Normal rate and regular rhythm.     Heart sounds: Normal heart sounds. No murmur heard. Pulmonary:     Effort: Pulmonary effort is normal. No respiratory distress.     Breath sounds: Normal breath sounds.  Musculoskeletal:     Right hand: No swelling, tenderness or bony tenderness. Normal range of motion. Normal strength. Normal capillary refill.     Cervical back: Neck supple. No rigidity. Normal range of motion.     Left hip: No deformity, tenderness or bony tenderness.     Right lower leg: No edema.     Left lower leg: No edema.  Skin:    General: Skin is warm and dry.  Neurological:     Mental Status: He is alert and oriented to person, place, and time. Mental status is at baseline.  Psychiatric:        Mood and  Affect: Mood normal.        Behavior: Behavior normal.      No results found for any visits on 10/10/22.    The ASCVD Risk score (Arnett DK, et al., 2019) failed to calculate for the following reasons:   The 2019 ASCVD risk score is only valid for ages 56 to 36    Assessment & Plan:   Yuan was seen today for medical management of chronic issues.  Diagnoses and all orders for this visit:  Primary hypertension Well controlled on current regimen. Continue to montior BP at home and notify for high or low reading. Labs pending.  -     CBC with Differential/Platelet -     CMP14+EGFR -     TSH  Dyslipidemia Not fasting. Last LDL at goal. Currently on simvastatin.   Benign prostatic hyperplasia with urinary frequency Managed by urology. Well controlled on current regimen f doxazosin.   Stage 3a chronic kidney disease ([place]) Labs pending. Avoid NSAIDs.   Chronic rhinitis Continue flonase and xyzal.   Paresthesia of finger Osteoarthritis of spine with radiculopathy, cervical region Discussed likely due to cervical DDD. Will check labs as below. Reports not very bothersome. Monitor for new or worsening symptoms.  -     TSH -     Vitamin B12  Left hip pain Discussed likely arthritis. Declined referral today. Continue tylenol prn.   Need for immunization against influenza -     Flu Vaccine QUAD High Dose(Fluad)   Return in about 3 months (around 01/10/2023) for chronic follow up .   The patient indicates understanding of these issues and agrees with the plan.  Gwenlyn Perking, FNP

## 2022-10-10 NOTE — Patient Instructions (Signed)
Arthritis Arthritis is a term that is commonly used to refer to joint pain or joint disease. There are more than 100 types of arthritis. What are the causes? The most common cause of this condition is wear and tear of a joint. Other causes include: Gout. Inflammation of a joint. An infection of a joint. Sprains and other injuries near the joint. A reaction to medicines or drugs, or an allergic reaction. In some cases, the cause may not be known. What are the signs or symptoms? The main symptom of this condition is pain in the joint during movement. Other symptoms include: Redness, swelling, or stiffness at a joint. Warmth coming from the joint. Fever. Overall feeling of illness. How is this diagnosed? This condition may be diagnosed with a physical exam and tests, including: Blood tests. Urine tests. Imaging tests, such as X-rays, an MRI, or a CT scan. Sometimes, fluid is removed from a joint for testing. How is this treated? This condition may be treated with: Treatment of the cause, if it is known. Rest. Raising (elevating) the joint. Applying cold or hot packs to the joint. Medicines to improve symptoms and reduce inflammation. Injections of a steroid, such as cortisone, into the joint to help reduce pain and inflammation. Depending on the cause of your arthritis, you may need to make lifestyle changes to reduce stress on your joint. Changes may include: Exercising more. Losing weight. Follow these instructions at home: Medicines Take over-the-counter and prescription medicines only as told by your health care provider. Do not take aspirin to relieve pain if your health care provider thinks that gout may be causing your pain. Activity Rest your joint if told by your health care provider. Rest is important when your disease is active and your joint feels painful, swollen, or stiff. Avoid activities that make the pain worse. Balance activity with rest. Exercise your joint  regularly with range-of-motion exercises as told by your health care provider. Try doing low-impact exercise, such as: Swimming. Water aerobics. Biking. Walking. Managing pain, stiffness, and swelling     If directed, put ice on the affected joint. To do this: Put ice in a plastic bag. Place a towel between your skin and the bag. Leave the ice on for 20 minutes, 2-3 times a day. Remove the ice if your skin turns bright red. This is very important. If you cannot feel pain, heat, or cold, you have a greater risk of damage to the area. If your joint is swollen, raise (elevate) it above the level of your heart if directed by your health care provider. If your joint feels stiff in the morning, try taking a warm shower. If directed, apply heat to the affected area as often as told by your health care provider. Use the heat source that your health care provider recommends, such as a moist heat pack or a heating pad. To apply heat: Place a towel between your skin and the heat source. Leave the heat on for 20-30 minutes. Remove the heat if your skin turns bright red. This is especially important if you are unable to feel pain, heat, or cold. You have a greater risk of getting burned. General instructions Maintain a healthy weight. Follow instructions from your health care provider for weight control. Do not use any products that contain nicotine or tobacco. These products include cigarettes, chewing tobacco, and vaping devices, such as e-cigarettes. If you need help quitting, ask your health care provider. Keep all follow-up visits. This is important. Where   to find more information National Institutes of Health: www.niams.nih.gov Contact a health care provider if: The pain gets worse. You have a fever. Get help right away if: You develop severe joint pain, swelling, or redness. Many joints become painful and swollen. You develop severe back pain. You develop severe weakness in your  leg. Summary Arthritis is a term that is commonly used to refer to joint pain or joint disease. There are more than 100 types of arthritis. The most common cause of this condition is wear and tear of a joint. Other causes include gout, inflammation or infection of the joint, sprains, or allergies. Symptoms of this condition include redness, swelling, or stiffness of the joint. Other symptoms include warmth, fever, or feeling ill. This condition is treated with rest, elevation, medicines, and applying cold or hot packs. Follow your health care provider's instructions about medicines, activity, exercises, and other home care treatments. This information is not intended to replace advice given to you by your health care provider. Make sure you discuss any questions you have with your health care provider. Document Revised: 08/28/2021 Document Reviewed: 08/28/2021 Elsevier Patient Education  2023 Elsevier Inc.  

## 2022-10-11 LAB — CBC WITH DIFFERENTIAL/PLATELET
Basophils Absolute: 0 10*3/uL (ref 0.0–0.2)
Basos: 0 %
EOS (ABSOLUTE): 0.2 10*3/uL (ref 0.0–0.4)
Eos: 3 %
Hematocrit: 48.7 % (ref 37.5–51.0)
Hemoglobin: 16.6 g/dL (ref 13.0–17.7)
Immature Grans (Abs): 0 10*3/uL (ref 0.0–0.1)
Immature Granulocytes: 0 %
Lymphocytes Absolute: 1.7 10*3/uL (ref 0.7–3.1)
Lymphs: 27 %
MCH: 31.9 pg (ref 26.6–33.0)
MCHC: 34.1 g/dL (ref 31.5–35.7)
MCV: 94 fL (ref 79–97)
Monocytes Absolute: 0.4 10*3/uL (ref 0.1–0.9)
Monocytes: 7 %
Neutrophils Absolute: 4 10*3/uL (ref 1.4–7.0)
Neutrophils: 63 %
Platelets: 193 10*3/uL (ref 150–450)
RBC: 5.2 x10E6/uL (ref 4.14–5.80)
RDW: 13 % (ref 11.6–15.4)
WBC: 6.3 10*3/uL (ref 3.4–10.8)

## 2022-10-11 LAB — CMP14+EGFR
ALT: 14 IU/L (ref 0–44)
AST: 23 IU/L (ref 0–40)
Albumin/Globulin Ratio: 1.5 (ref 1.2–2.2)
Albumin: 4.3 g/dL (ref 3.7–4.7)
Alkaline Phosphatase: 84 IU/L (ref 44–121)
BUN/Creatinine Ratio: 10 (ref 10–24)
BUN: 13 mg/dL (ref 8–27)
Bilirubin Total: 0.5 mg/dL (ref 0.0–1.2)
CO2: 21 mmol/L (ref 20–29)
Calcium: 9.9 mg/dL (ref 8.6–10.2)
Chloride: 104 mmol/L (ref 96–106)
Creatinine, Ser: 1.28 mg/dL — ABNORMAL HIGH (ref 0.76–1.27)
Globulin, Total: 2.8 g/dL (ref 1.5–4.5)
Glucose: 97 mg/dL (ref 70–99)
Potassium: 4.7 mmol/L (ref 3.5–5.2)
Sodium: 141 mmol/L (ref 134–144)
Total Protein: 7.1 g/dL (ref 6.0–8.5)
eGFR: 54 mL/min/{1.73_m2} — ABNORMAL LOW (ref >59)

## 2022-10-11 LAB — VITAMIN B12: Vitamin B-12: 1832 pg/mL — ABNORMAL HIGH (ref 232–1245)

## 2022-10-11 LAB — TSH: TSH: 2.81 u[IU]/mL (ref 0.450–4.500)

## 2023-01-06 ENCOUNTER — Other Ambulatory Visit: Payer: Self-pay | Admitting: Family Medicine

## 2023-01-06 DIAGNOSIS — R0981 Nasal congestion: Secondary | ICD-10-CM

## 2023-01-06 DIAGNOSIS — I10 Essential (primary) hypertension: Secondary | ICD-10-CM

## 2023-01-16 ENCOUNTER — Encounter: Payer: Self-pay | Admitting: Family Medicine

## 2023-01-16 ENCOUNTER — Ambulatory Visit (INDEPENDENT_AMBULATORY_CARE_PROVIDER_SITE_OTHER): Payer: Medicare Other | Admitting: Family Medicine

## 2023-01-16 VITALS — BP 134/63 | HR 75 | Temp 98.0°F | Ht 68.0 in | Wt 181.2 lb

## 2023-01-16 DIAGNOSIS — J310 Chronic rhinitis: Secondary | ICD-10-CM

## 2023-01-16 DIAGNOSIS — I25810 Atherosclerosis of coronary artery bypass graft(s) without angina pectoris: Secondary | ICD-10-CM | POA: Diagnosis not present

## 2023-01-16 DIAGNOSIS — N1831 Chronic kidney disease, stage 3a: Secondary | ICD-10-CM

## 2023-01-16 DIAGNOSIS — R011 Cardiac murmur, unspecified: Secondary | ICD-10-CM | POA: Diagnosis not present

## 2023-01-16 DIAGNOSIS — N401 Enlarged prostate with lower urinary tract symptoms: Secondary | ICD-10-CM

## 2023-01-16 DIAGNOSIS — R350 Frequency of micturition: Secondary | ICD-10-CM

## 2023-01-16 DIAGNOSIS — I10 Essential (primary) hypertension: Secondary | ICD-10-CM | POA: Diagnosis not present

## 2023-01-16 MED ORDER — FLUTICASONE PROPIONATE 50 MCG/ACT NA SUSP: 2.0000 | Freq: Every day | NASAL | 6 refills | Status: DC

## 2023-01-16 NOTE — Progress Notes (Signed)
Established Patient Office Visit  Subjective   Patient ID: Mitchell Chandler, male    DOB: 30-Apr-1932  Age: 89 y.o. MRN: [id number]  Chief Complaint  Patient presents with   Medical Management of Chronic Issues   Hypertension    HPI Here with daughter today. He is now living with her.   HTN Current Medications - lasix prn Checking BP at home ranging- 130/60s Pertinent ROS:  Visual Disturbances - No Chest pain - No Dyspnea - No Palpitations - No LE edema - No  2. Allergic rhinitis Takes zyrtec daily for allergic rhinitis. Reports chronic ear congestion and nasal congestion. Reports stable symptoms.    3. HLD On simvastatin.   4. BPH He sees urology yearly. Currently well controlled with doxazosin.   BP Readings from Last 3 Encounters:  01/16/23 134/63  10/10/22 (!) 157/71  07/10/22 124/63      Latest Ref Rng & Units 10/10/2022    4:20 PM 06/19/2022   11:51 AM 06/14/2022   10:47 AM  CMP  Glucose 70 - 99 mg/dL 97  107  92   BUN 8 - 27 mg/dL 13  17  15   $ Creatinine 0.76 - 1.27 mg/dL 1.28  1.41  1.20   Sodium 134 - 144 mmol/L 141  143  139   Potassium 3.5 - 5.2 mmol/L 4.7  4.5  4.3   Chloride 96 - 106 mmol/L 104  108  108   CO2 20 - 29 mmol/L 21  19  24   $ Calcium 8.6 - 10.2 mg/dL 9.9  9.3  9.2   Total Protein 6.0 - 8.5 g/dL 7.1   7.3   Total Bilirubin 0.0 - 1.2 mg/dL 0.5   1.0   Alkaline Phos 44 - 121 IU/L 84   59   AST 0 - 40 IU/L 23   23   ALT 0 - 44 IU/L 14   16      Past Medical History:  Diagnosis Date   Benign prostatic hyperplasia 12/25/2016   Heart block AV complete ([place]) oct 1995   surgery at [facility] 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 Yearly surviellance.    Renal stone 01/26/2020   Vitamin D deficiency 08/13/2020      ROS Negative unless specially indicated above in HPI.   Objective:     There were no vitals taken  for this visit. BP Readings from Last 3 Encounters:  10/10/22 (!) 157/71  07/10/22 124/63  06/19/22 (!) 95/48      Physical Exam Vitals and nursing note reviewed.  Constitutional:      General: He is not in acute distress.    Appearance: He is not ill-appearing, toxic-appearing or diaphoretic.  HENT:     Head: Normocephalic and atraumatic.     Right Ear: Tympanic membrane, ear canal and external ear normal.     Left Ear: Tympanic membrane, ear canal and external ear normal.     Nose: Nose normal.     Mouth/Throat:     Mouth: Mucous membranes are moist.     Pharynx: Oropharynx is clear.  Neck:     Thyroid: No thyroid mass, thyromegaly or thyroid tenderness.  Cardiovascular:     Rate and Rhythm: Normal rate and regular rhythm.     Heart sounds: Murmur heard.  Systolic murmur is present with a grade of 3/6.     No diastolic murmur is present.     No friction rub. No gallop. No S3 or S4 sounds.  Pulmonary:     Effort: Pulmonary effort is normal. No respiratory distress.     Breath sounds: Normal breath sounds.  Musculoskeletal:     Right hand: No swelling, tenderness or bony tenderness. Normal range of motion. Normal strength. Normal capillary refill.     Cervical back: Neck supple. No rigidity. Normal range of motion.     Left hip: No deformity, tenderness or bony tenderness.     Right lower leg: No edema.     Left lower leg: No edema.  Skin:    General: Skin is warm and dry.  Neurological:     Mental Status: He is alert and oriented to person, place, and time. Mental status is at baseline.  Psychiatric:        Mood and Affect: Mood normal.        Behavior: Behavior normal.      No results found for any visits on 01/16/23.    The ASCVD Risk score (Arnett DK, et al., 2019) failed to calculate for the following reasons:   The 2019 ASCVD risk score is only valid for ages 52 to 85    Assessment & Plan:   Demarious was seen today for medical management of chronic  issues and hypertension.  Diagnoses and all orders for this visit:  Primary hypertension Well controlled on current regimen. Continue lasix. Labs pending.  -     CBC with Differential/Platelet -     BMP8+EGFR  Heart murmur New murmur heard today. Denies symptoms. Referral to cardiology given history.  -     Ambulatory referral to Cardiology -     CBC with Differential/Platelet -     BMP8+EGFR  Coronary artery disease involving coronary bypass graft of native heart, unspecified whether angina present On statin and aspirin. Referral placed.  -     Ambulatory referral to Cardiology  Stage 3a chronic kidney disease ([place]) Labs pending.  -     BMP8+EGFR  Chronic rhinitis Add flonase. Continue xyzal.  -     fluticasone (FLONASE) 50 MCG/ACT nasal spray; Place 2 sprays into both nostrils daily.  Benign prostatic hyperplasia with urinary frequency Managed by urology. Continue follow up.   Return in about 3 months (around 04/16/2023) for chronic follow up. Sooner for new or worsening symptoms.   The patient indicates understanding of these issues and agrees with the plan.  Gwenlyn Perking, FNP

## 2023-01-16 NOTE — Addendum Note (Signed)
Addended by: Gwenlyn Perking on: 01/16/2023 04:07 PM   Modules accepted: Level of Service

## 2023-01-17 LAB — BMP8+EGFR
BUN/Creatinine Ratio: 14 (ref 10–24)
BUN: 15 mg/dL (ref 8–27)
CO2: 20 mmol/L (ref 20–29)
Calcium: 9.8 mg/dL (ref 8.6–10.2)
Chloride: 105 mmol/L (ref 96–106)
Creatinine, Ser: 1.04 mg/dL (ref 0.76–1.27)
Glucose: 90 mg/dL (ref 70–99)
Potassium: 4.5 mmol/L (ref 3.5–5.2)
Sodium: 142 mmol/L (ref 134–144)
eGFR: 69 mL/min/{1.73_m2} (ref >59)

## 2023-01-17 LAB — CBC WITH DIFFERENTIAL/PLATELET
Basophils Absolute: 0 10*3/uL (ref 0.0–0.2)
Basos: 0 %
EOS (ABSOLUTE): 0.2 10*3/uL (ref 0.0–0.4)
Eos: 2 %
Hematocrit: 47.3 % (ref 37.5–51.0)
Hemoglobin: 16.2 g/dL (ref 13.0–17.7)
Immature Grans (Abs): 0 10*3/uL (ref 0.0–0.1)
Immature Granulocytes: 0 %
Lymphocytes Absolute: 1.7 10*3/uL (ref 0.7–3.1)
Lymphs: 25 %
MCH: 32.3 pg (ref 26.6–33.0)
MCHC: 34.2 g/dL (ref 31.5–35.7)
MCV: 94 fL (ref 79–97)
Monocytes Absolute: 0.4 10*3/uL (ref 0.1–0.9)
Monocytes: 6 %
Neutrophils Absolute: 4.4 10*3/uL (ref 1.4–7.0)
Neutrophils: 67 %
Platelets: 210 10*3/uL (ref 150–450)
RBC: 5.01 x10E6/uL (ref 4.14–5.80)
RDW: 13.1 % (ref 11.6–15.4)
WBC: 6.7 10*3/uL (ref 3.4–10.8)

## 2023-03-03 DIAGNOSIS — R011 Cardiac murmur, unspecified: Secondary | ICD-10-CM | POA: Insufficient documentation

## 2023-03-03 NOTE — Progress Notes (Unsigned)
Cardiology Office Note   Date:  03/05/2023   ID:  Mitchell Chandler, DOB 03-23-1932, MRN [id number]  PCP:  Gwenlyn Perking, FNP  Cardiologist:   None Referring:  Gwenlyn Perking, FNP  Chief Complaint  Patient presents with   Heart Murmur      History of Present Illness: Mitchell Chandler is a 89 y.o. male who presents for evaluation of a murmur.  He had a history of bypass in 1995 by Dr. Redmond Pulling.  He said 6 vessels.  He has not seen a cardiologist since then.  He has had no cardiovascular testing since then.  He is followed routinely with his primary providers and is done well.  He denies any chest pressure, neck or arm discomfort.  He has no shortness of breath, PND or orthopnea.  He had no palpitations, presyncope or syncope.  He was noted recently to have a heart murmur and was sent here.  He says they never found this before.  His limitations are his joints and his back.  He gets around little bit in his house and lives with his wife and does do some grocery shopping.   Past Medical History:  Diagnosis Date   Benign prostatic hyperplasia 12/25/2016   Heart block AV complete oct 1995   [facility] 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 Yearly surviellance.    Renal stone 01/26/2020   Vitamin D deficiency 08/13/2020    Past Surgical History:  Procedure Laterality Date   COLONOSCOPY  10/02/2011   Procedure: COLONOSCOPY;  Surgeon: Rogene Houston, MD;  [facility];  Service: Endoscopy;  Laterality: N/A;  10:30 am   COLONOSCOPY N/A 12/21/2014   Procedure: COLONOSCOPY;  Surgeon: Rogene Houston, MD;  [facility]: [facility];  Service: Endoscopy;  Laterality: N/A;  1030   CORONARY ARTERY BYPASS GRAFT  1995   CYSTOSCOPY W/ URETERAL STENT PLACEMENT Left 01/26/2020   Procedure: CYSTOSCOPY WITH RETROGRADE PYELOGRAM/URETERAL STENT PLACEMENT;  Surgeon:  Alexis Frock, MD;  Location: [facility];  Service: Urology;  Laterality: Left;   CYSTOSCOPY WITH RETROGRADE PYELOGRAM, URETEROSCOPY AND STENT PLACEMENT Left 11/19/2019   Procedure: CYSTOSCOPY WITH RETROGRADE PYELOGRAM, DIAGNOSTIC URETEROSCOPY AND STENT PLACEMENT;  Surgeon: Alexis Frock, MD;  [facility]: [facility];  Service: Urology;  Laterality: Left;  1   EYE SURGERY     feb 7 feb 28 [facility]   KIDNEY STONE SURGERY     NEPHROLITHOTOMY Left 01/26/2020   Procedure: NEPHROLITHOTOMY PERCUTANEOUS;  Surgeon: Alexis Frock, MD;  Location: [facility];  Service: Urology;  Laterality: Left;  3 HRS   PROSTATE BIOPSY       Current Outpatient Medications  Medication Sig Dispense Refill   acetaminophen (TYLENOL) 500 MG tablet Take 500-1,000 mg by mouth every 6 (six) hours as needed (for arthritis pain (neck/shoulder)).      aspirin 81 MG EC tablet Take 81 mg by mouth daily. Swallow whole.     Cholecalciferol (VITAMIN D3) 250 MCG (10000 UT) CAPS Take 1,000 Units by mouth daily.     doxazosin (CARDURA) 8 MG tablet 1 tablet daily 90 tablet 1   fluticasone (FLONASE) 50 MCG/ACT nasal spray Place 2 sprays into both nostrils daily. 16 g 6   furosemide (LASIX) 20 MG tablet Take 1 tablet (20 mg total) by mouth  daily. 90 tablet 1   levocetirizine (XYZAL) 5 MG tablet TAKE ONE TABLET EVERY EVENING 90 tablet 1   metoprolol tartrate (LOPRESSOR) 25 MG tablet Take 25 mg by mouth as needed.     simvastatin (ZOCOR) 20 MG tablet Take 1 tablet (20 mg total) by mouth at bedtime. 90 tablet 1   WESTAB ONE 2.5-25-1 MG TABS tablet TAKE ONE TABLET ONCE DAILY 90 tablet 1   No current facility-administered medications for this visit.    Allergies:   Patient has no known allergies.    Social History:  The patient  reports that he quit smoking about 60 years ago. His smoking use included cigarettes. He has never used smokeless tobacco. He reports that he does not drink alcohol and does not use drugs.    Family History:  The patient's family history includes Heart disease in his mother; Hypertension in his brother, brother, brother, brother, and father.    ROS:  Please see the history of present illness.   Otherwise, review of systems are positive for none.   All other systems are reviewed and negative.    PHYSICAL EXAM: VS:  BP (!) 152/78   Pulse 99   Ht 5\' 8"  (1.727 m)   Wt 178 lb (80.7 kg)   BMI 27.06 kg/m  , BMI Body mass index is 27.06 kg/m. GENERAL:  Well appearing HEENT:  Pupils equal round and reactive, fundi not visualized, oral mucosa unremarkable NECK:  No jugular venous distention, waveform within normal limits, carotid upstroke brisk and symmetric, no bruits, no thyromegaly LYMPHATICS:  No cervical, inguinal adenopathy LUNGS:  Clear to auscultation bilaterally BACK:  No CVA tenderness CHEST:  Well healed sternotomy scar. HEART:  PMI not displaced or sustained,S1 and S2 within normal limits, no S3, no S4, no clicks, no rubs, 3 out of 6 apical systolic murmur radiating slightly at aortic outflow tract and early peaking diastolic murmurs ABD:  Flat, positive bowel sounds normal in frequency in pitch, no bruits, no rebound, no guarding, no midline pulsatile mass, no hepatomegaly, no splenomegaly EXT:  2 plus pulses throughout, no edema, no cyanosis no clubbing SKIN:  No rashes no nodules NEURO:  Cranial nerves II through XII grossly intact, motor grossly intact throughout PSYCH:  Cognitively intact, oriented to person place and time    EKG:  EKG is ordered today. The ekg ordered today demonstrates sinus rhythm, rate 99, axis within normal limits, first-degree AV block   Recent Labs: 10/10/2022: ALT 14; TSH 2.810 01/16/2023: BUN 15; Creatinine, Ser 1.04; Hemoglobin 16.2; Platelets 210; Potassium 4.5; Sodium 142    Lipid Panel    Component Value Date/Time   CHOL 159 01/09/2022 1404   TRIG 240 (H) 01/09/2022 1404   HDL 50 01/09/2022 1404   CHOLHDL 3.2 01/09/2022  1404   LDLCALC 70 01/09/2022 1404      Wt Readings from Last 3 Encounters:  03/05/23 178 lb (80.7 kg)  01/16/23 181 lb 4 oz (82.2 kg)  10/10/22 184 lb (83.5 kg)      Other studies Reviewed: Additional studies/ records that were reviewed today include: [facility]. Review of the above records demonstrates:  Please see elsewhere in the note.     ASSESSMENT AND PLAN:  Murmur: The patient has had currently has some aortic stenosis which I suspect to be mild.  I will get an echocardiogram.  At this point no change in therapy.  Dyslipidemia: LDL 70 with an HDL of 50.  He has done well over  the years.  No change in therapy.  HTN: The blood pressure is elevated but they bring me a blood pressure diary and it is well-controlled at home so no change in therapy.  CAD: He has done exceptionally well all of these years and has no symptoms.  We will continue with secondary risk reduction.  No further imaging.   Current medicines are reviewed at length with the patient today.  The patient does not have concerns regarding medicines.  The following changes have been made:  no change  Labs/ tests ordered today include:   Orders Placed This Encounter  Procedures   EKG 12-Lead   ECHOCARDIOGRAM COMPLETE     Disposition:   FU with me in 1 year or sooner based on the results of the above.   Signed, Minus Breeding, MD  03/05/2023 2:53 PM    [place]

## 2023-03-05 ENCOUNTER — Ambulatory Visit (INDEPENDENT_AMBULATORY_CARE_PROVIDER_SITE_OTHER): Payer: Medicare Other | Admitting: Cardiology

## 2023-03-05 ENCOUNTER — Encounter: Payer: Self-pay | Admitting: Cardiology

## 2023-03-05 VITALS — BP 152/78 | HR 99 | Ht 68.0 in | Wt 178.0 lb

## 2023-03-05 DIAGNOSIS — E785 Hyperlipidemia, unspecified: Secondary | ICD-10-CM

## 2023-03-05 DIAGNOSIS — I10 Essential (primary) hypertension: Secondary | ICD-10-CM | POA: Diagnosis not present

## 2023-03-05 DIAGNOSIS — R011 Cardiac murmur, unspecified: Secondary | ICD-10-CM

## 2023-03-05 NOTE — Patient Instructions (Signed)
Medication Instructions:  The current medical regimen is effective;  continue present plan and medications.  *If you need a refill on your cardiac medications before your next appointment, please call your pharmacy*  Testing/Procedures: Your physician has requested that you have an echocardiogram. Echocardiography is a painless test that uses sound waves to create images of your heart. It provides your doctor with information about the size and shape of your heart and how well your heart's chambers and valves are working. This procedure takes approximately one hour. There are no restrictions for this procedure. Please do NOT wear cologne, perfume, aftershave, or lotions (deodorant is allowed). Please arrive 15 minutes prior to your appointment time. You will be contacted to be scheduled.   Follow-Up: At [facility], you and your health needs are our priority.  As part of our continuing mission to provide you with exceptional heart care, we have created designated Provider Care Teams.  These Care Teams include your primary Cardiologist (physician) and Advanced Practice Providers (APPs -  Physician Assistants and Nurse Practitioners) who all work together to provide you with the care you need, when you need it.  We recommend signing up for the patient portal called "MyChart".  Sign up information is provided on this After Visit Summary.  MyChart is used to connect with patients for Virtual Visits (Telemedicine).  Patients are able to view lab/test results, encounter notes, upcoming appointments, etc.  Non-urgent messages can be sent to your provider as well.   To learn more about what you can do with MyChart, go to [email or web address].    Your next appointment:   6 month(s)  Provider:   Minus Breeding, MD

## 2023-03-10 ENCOUNTER — Other Ambulatory Visit: Payer: Self-pay | Admitting: Family Medicine

## 2023-03-10 DIAGNOSIS — E785 Hyperlipidemia, unspecified: Secondary | ICD-10-CM

## 2023-04-03 ENCOUNTER — Ambulatory Visit (HOSPITAL_COMMUNITY)
Admission: RE | Admit: 2023-04-03 | Discharge: 2023-04-03 | Disposition: A | Discharge status: Home / Self Care | Payer: Medicare Other | Source: Ambulatory Visit | Attending: Cardiology | Admitting: Cardiology

## 2023-04-03 DIAGNOSIS — R011 Cardiac murmur, unspecified: Secondary | ICD-10-CM | POA: Diagnosis present

## 2023-04-03 DIAGNOSIS — I10 Essential (primary) hypertension: Secondary | ICD-10-CM | POA: Diagnosis present

## 2023-04-03 LAB — ECHOCARDIOGRAM COMPLETE
AR max vel: 1.13 cm2
AV Area VTI: 1.11 cm2
AV Area mean vel: 1.15 cm2
AV Mean grad: 25 mmHg
AV Peak grad: 43.3 mmHg
Ao pk vel: 3.29 m/s
Area-P 1/2: 2.24 cm2
S' Lateral: 2.4 cm

## 2023-04-03 NOTE — Progress Notes (Signed)
*  PRELIMINARY RESULTS* Echocardiogram 2D Echocardiogram has been performed.  Stacey Drain 04/03/2023, 10:24 AM

## 2023-04-07 ENCOUNTER — Other Ambulatory Visit: Payer: Self-pay | Admitting: *Deleted

## 2023-04-16 ENCOUNTER — Encounter: Payer: Self-pay | Admitting: Family Medicine

## 2023-04-16 ENCOUNTER — Ambulatory Visit (INDEPENDENT_AMBULATORY_CARE_PROVIDER_SITE_OTHER): Payer: Medicare Other | Admitting: Family Medicine

## 2023-04-16 VITALS — BP 130/68 | HR 60 | Temp 98.2°F | Ht 68.0 in | Wt 179.5 lb

## 2023-04-16 DIAGNOSIS — E782 Mixed hyperlipidemia: Secondary | ICD-10-CM | POA: Diagnosis not present

## 2023-04-16 DIAGNOSIS — E785 Hyperlipidemia, unspecified: Secondary | ICD-10-CM

## 2023-04-16 DIAGNOSIS — I10 Essential (primary) hypertension: Secondary | ICD-10-CM | POA: Diagnosis not present

## 2023-04-16 DIAGNOSIS — R0981 Nasal congestion: Secondary | ICD-10-CM

## 2023-04-16 DIAGNOSIS — R011 Cardiac murmur, unspecified: Secondary | ICD-10-CM | POA: Diagnosis not present

## 2023-04-16 DIAGNOSIS — R350 Frequency of micturition: Secondary | ICD-10-CM

## 2023-04-16 DIAGNOSIS — N1831 Chronic kidney disease, stage 3a: Secondary | ICD-10-CM

## 2023-04-16 DIAGNOSIS — N401 Enlarged prostate with lower urinary tract symptoms: Secondary | ICD-10-CM

## 2023-04-16 DIAGNOSIS — J309 Allergic rhinitis, unspecified: Secondary | ICD-10-CM | POA: Insufficient documentation

## 2023-04-16 MED ORDER — FUROSEMIDE 20 MG PO TABS: 20.0000 mg | ORAL_TABLET | Freq: Every day | ORAL | 1 refills | Status: DC

## 2023-04-16 MED ORDER — WESTAB ONE 2.5-25-1 MG PO TABS: 1.0000 | ORAL_TABLET | Freq: Every day | ORAL | 1 refills | Status: DC

## 2023-04-16 MED ORDER — LEVOCETIRIZINE DIHYDROCHLORIDE 5 MG PO TABS: 5.0000 mg | ORAL_TABLET | Freq: Every evening | ORAL | 1 refills | Status: DC

## 2023-04-16 MED ORDER — SIMVASTATIN 20 MG PO TABS: 20.0000 mg | ORAL_TABLET | Freq: Every day | ORAL | 1 refills | Status: DC

## 2023-04-16 MED ORDER — DOXAZOSIN MESYLATE 8 MG PO TABS: ORAL_TABLET | ORAL | 1 refills | Status: DC

## 2023-04-16 NOTE — Progress Notes (Signed)
Established Patient Office Visit  Subjective   Patient ID: Mitchell Chandler, male    DOB: September 26, 1932  Age: 89 y.o. MRN: [id number]  Chief Complaint  Patient presents with   Medical Management of Chronic Issues   Hypertension    HPI  HTN Current Medications -lopressor, lasix prn Checking BP at home ranging- 130/60s Pertinent ROS:  Visual Disturbances - No Chest pain - No Dyspnea - No Palpitations - No LE edema - No  He has seen cardiology regarding hear murmur. He had an echo- moderate aortic stenosis.   2. Allergic rhinitis He has been taking xyzal and flonase daily. He continues to have ear congestion and nasal congestion. Stable. No fever.    3. HLD On simvastatin. Denies side effects.  4. BPH He sees urology yearly. Currently well controlled with doxazosin.   BP Readings from Last 3 Encounters:  04/16/23 130/68  03/05/23 (!) 152/78  01/16/23 134/63      Latest Ref Rng & Units 01/16/2023    3:19 PM 10/10/2022    4:20 PM 06/19/2022   11:51 AM  CMP  Glucose 70 - 99 mg/dL 90  97  119   BUN 8 - 27 mg/dL 15  13  17    Creatinine 0.76 - 1.27 mg/dL 1.47  8.29  5.62   Sodium 134 - 144 mmol/L 142  141  143   Potassium 3.5 - 5.2 mmol/L 4.5  4.7  4.5   Chloride 96 - 106 mmol/L 105  104  108   CO2 20 - 29 mmol/L 20  21  19    Calcium 8.6 - 10.2 mg/dL 9.8  9.9  9.3   Total Protein 6.0 - 8.5 g/dL  7.1    Total Bilirubin 0.0 - 1.2 mg/dL  0.5    Alkaline Phos 44 - 121 IU/L  84    AST 0 - 40 IU/L  23    ALT 0 - 44 IU/L  14       Past Medical History:  Diagnosis Date   Benign prostatic hyperplasia 12/25/2016   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 Yearly surviellance.    Renal stone 01/26/2020   Vitamin D deficiency 08/13/2020      ROS Negative unless specially indicated above in HPI.    Objective:     BP 130/68 Comment: at home reading per pt  Pulse 60   Temp 98.2 F (36.8 C) (Temporal)   Ht 5\' 8"  (1.727 m)   Wt 179 lb 8 oz (81.4 kg)   SpO2 95%   BMI 27.29 kg/m  BP Readings from Last 3 Encounters:  04/16/23 130/68  03/05/23 (!) 152/78  01/16/23 134/63      Physical Exam Vitals and nursing note reviewed.  Constitutional:      General: He is not in acute distress.    Appearance: He is not ill-appearing, toxic-appearing or diaphoretic.  HENT:     Head: Normocephalic and atraumatic.     Right Ear: Tympanic membrane, ear canal and external ear normal.     Left Ear: Tympanic membrane, ear canal and external ear normal.     Nose: Nose normal.     Mouth/Throat:     Mouth: Mucous membranes are moist.     Pharynx:  Oropharynx is clear.  Neck:     Thyroid: No thyroid mass, thyromegaly or thyroid tenderness.  Cardiovascular:     Rate and Rhythm: Normal rate and regular rhythm.     Heart sounds: Murmur heard.     Systolic murmur is present with a grade of 3/6.     No diastolic murmur is present.     No friction rub. No gallop. No S3 or S4 sounds.  Pulmonary:     Effort: Pulmonary effort is normal. No respiratory distress.     Breath sounds: Normal breath sounds.  Musculoskeletal:     Right hand: No swelling, tenderness or bony tenderness. Normal range of motion. Normal strength. Normal capillary refill.     Cervical back: Neck supple. No rigidity. Normal range of motion.     Left hip: No deformity, tenderness or bony tenderness.     Right lower leg: No edema.     Left lower leg: No edema.  Skin:    General: Skin is warm and dry.  Neurological:     Mental Status: He is alert and oriented to person, place, and time. Mental status is at baseline.  Psychiatric:        Mood and Affect: Mood normal.        Behavior: Behavior normal.      No results found for any visits on 04/16/23.    The ASCVD Risk score (Arnett DK, et al., 2019) failed to calculate  for the following reasons:   The 2019 ASCVD risk score is only valid for ages 49 to 22    Assessment & Plan:   Mitchell Chandler was seen today for medical management of chronic issues and hypertension.  Diagnoses and all orders for this visit:  Primary hypertension Well controlled on current regimen.  -     furosemide (LASIX) 20 MG tablet; Take 1 tablet (20 mg total) by mouth daily.  Murmur Established with cardiology for work up. Echo with moderate aortic stenosis.   Mixed hyperlipidemia Last LDL 70.  -     simvastatin (ZOCOR) 20 MG tablet; Take 1 tablet (20 mg total) by mouth at bedtime.  Stage 3a chronic kidney disease (HCC) Labs pending.  -     CMP14+EGFR -     VITAMIN D 25 Hydroxy (Vit-D Deficiency, Fractures)  Chronic allergic rhinitis Continue xyzal and flonase.  -     levocetirizine (XYZAL) 5 MG tablet; Take 1 tablet (5 mg total) by mouth every evening.  Benign prostatic hyperplasia with urinary frequency Well controlled on current regimen. Continue follow up with urology.  -     doxazosin (CARDURA) 8 MG tablet; 1 tablet daily   Return in about 3 months (around 07/17/2023) for medication follow up.   The patient indicates understanding of these issues and agrees with the plan.  Gabriel Earing, FNP

## 2023-04-17 LAB — CMP14+EGFR
ALT: 16 IU/L (ref 0–44)
AST: 22 IU/L (ref 0–40)
Albumin/Globulin Ratio: 1.8 (ref 1.2–2.2)
Albumin: 4.3 g/dL (ref 3.7–4.7)
Alkaline Phosphatase: 76 IU/L (ref 44–121)
BUN/Creatinine Ratio: 13 (ref 10–24)
BUN: 13 mg/dL (ref 8–27)
Bilirubin Total: 0.5 mg/dL (ref 0.0–1.2)
CO2: 21 mmol/L (ref 20–29)
Calcium: 9.5 mg/dL (ref 8.6–10.2)
Chloride: 106 mmol/L (ref 96–106)
Creatinine, Ser: 1.04 mg/dL (ref 0.76–1.27)
Globulin, Total: 2.4 g/dL (ref 1.5–4.5)
Glucose: 86 mg/dL (ref 70–99)
Potassium: 4.6 mmol/L (ref 3.5–5.2)
Sodium: 142 mmol/L (ref 134–144)
Total Protein: 6.7 g/dL (ref 6.0–8.5)
eGFR: 69 mL/min/{1.73_m2} (ref >59)

## 2023-04-17 LAB — VITAMIN D 25 HYDROXY (VIT D DEFICIENCY, FRACTURES): Vit D, 25-Hydroxy: 52.5 ng/mL (ref 30.0–100.0)

## 2023-07-17 ENCOUNTER — Encounter: Payer: Self-pay | Admitting: Family Medicine

## 2023-07-17 ENCOUNTER — Ambulatory Visit (INDEPENDENT_AMBULATORY_CARE_PROVIDER_SITE_OTHER): Payer: Medicare Other | Admitting: Family Medicine

## 2023-07-17 DIAGNOSIS — R350 Frequency of micturition: Secondary | ICD-10-CM

## 2023-07-17 DIAGNOSIS — N401 Enlarged prostate with lower urinary tract symptoms: Secondary | ICD-10-CM

## 2023-07-17 DIAGNOSIS — I25810 Atherosclerosis of coronary artery bypass graft(s) without angina pectoris: Secondary | ICD-10-CM

## 2023-07-17 DIAGNOSIS — R011 Cardiac murmur, unspecified: Secondary | ICD-10-CM | POA: Diagnosis not present

## 2023-07-17 DIAGNOSIS — I10 Essential (primary) hypertension: Secondary | ICD-10-CM | POA: Diagnosis not present

## 2023-07-17 DIAGNOSIS — N1831 Chronic kidney disease, stage 3a: Secondary | ICD-10-CM

## 2023-07-17 DIAGNOSIS — J309 Allergic rhinitis, unspecified: Secondary | ICD-10-CM

## 2023-07-17 DIAGNOSIS — E785 Hyperlipidemia, unspecified: Secondary | ICD-10-CM | POA: Diagnosis not present

## 2023-07-17 MED ORDER — AZELASTINE HCL 0.1 % NA SOLN: 1.0000 | Freq: Two times a day (BID) | NASAL | 4 refills | Status: DC

## 2023-07-17 NOTE — Progress Notes (Signed)
Established Patient Office Visit  Subjective   Patient ID: Mitchell Chandler, male    DOB: 11-17-1932  Age: 89 y.o. MRN: [id number]  Chief Complaint  Patient presents with   Medical Management of Chronic Issues   Hypertension    HPI HTN Current Medications -lopressor, lasix prn Checking BP at home ranging- 130/60s Pertinent ROS:  Visual Disturbances - No Chest pain - No Dyspnea - No Palpitations - No LE edema - No  He had an echo earlier this year- moderate aortic stenosis. On Aspirin, statin.   2. HLD On simvastatin. Denies side effects. Last LDL at 70- over a year ago now. Not fasting today.   3. BPH He sees urology yearly. Currently well controlled with doxazosin. Void 1x a night.   4. Allergies Still having a lot of congestion, ear pressure, runny nose. Taking zyrtec and flonase daily.    Past Medical History:  Diagnosis Date   Benign prostatic hyperplasia 12/25/2016   Heart block AV complete [facility]) oct 1995   surgery at [facility] 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 Yearly surviellance.    Renal stone 01/26/2020   Vitamin D deficiency 08/13/2020      ROS Negative unless specially indicated above in HPI.   Objective:     BP 132/64   Pulse 70   Temp 98.9 F (37.2 C) (Temporal)   Ht 5\' 8"  (1.727 m)   Wt 177 lb 6 oz (80.5 kg)   SpO2 93%   BMI 26.97 kg/m  BP Readings from Last 3 Encounters:  07/17/23 132/64  04/16/23 130/68  03/05/23 (!) 152/78      Physical Exam Vitals and nursing note reviewed.  Constitutional:      General: He is not in acute distress.    Appearance: He is not ill-appearing, toxic-appearing or diaphoretic.  HENT:     Head: Normocephalic and atraumatic.     Right Ear: Tympanic membrane, ear canal and external ear normal.     Left Ear: Tympanic membrane, ear canal and external ear normal.     Nose: Nose  normal.     Mouth/Throat:     Mouth: Mucous membranes are moist.     Pharynx: Oropharynx is clear.  Eyes:     General:        Right eye: No discharge.        Left eye: No discharge.     Extraocular Movements: Extraocular movements intact.  Neck:     Thyroid: No thyroid mass, thyromegaly or thyroid tenderness.  Cardiovascular:     Rate and Rhythm: Normal rate and regular rhythm.     Heart sounds: Murmur heard.     Systolic murmur is present with a grade of 3/6.     No diastolic murmur is present.     No friction rub. No gallop. No S3 or S4 sounds.  Pulmonary:     Effort: Pulmonary effort is normal. No respiratory distress.     Breath sounds: Normal breath sounds.  Musculoskeletal:     Cervical back: Neck supple. No rigidity. Normal range of motion.     Left hip: No deformity, tenderness or bony tenderness.     Right lower leg: No edema.     Left lower leg: No edema.  Lymphadenopathy:     Cervical: No cervical adenopathy.  Skin:  General: Skin is warm and dry.  Neurological:     Mental Status: He is alert and oriented to person, place, and time. Mental status is at baseline.  Psychiatric:        Mood and Affect: Mood normal.        Behavior: Behavior normal.      No results found for any visits on 07/17/23.    The ASCVD Risk score (Arnett DK, et al., 2019) failed to calculate for the following reasons:   The 2019 ASCVD risk score is only valid for ages 47 to 27    Assessment & Plan:   Mitchell Chandler was seen today for medical management of chronic issues and hypertension.  Diagnoses and all orders for this visit:  Primary hypertension Well controlled on current regimen.  -     CBC with Differential/Platelet; Future -     CMP14+EGFR; Future -     Lipid panel; Future  Heart murmur Echo UTD. Asymptomatic. Will follow up with cardiology in 2 months.   Coronary artery disease involving coronary bypass graft of native heart, unspecified whether angina present On aspirin  and statin. He will return for fasting labs.  -     CBC with Differential/Platelet; Future -     CMP14+EGFR; Future -     Lipid panel; Future  Dyslipidemia On statin.  -     Lipid panel; Future  Stage 3a chronic kidney disease (HCC) Avoid NSAIDs. CMP pending.   Benign prostatic hyperplasia with urinary frequency Managed by urology. On doxazosin.   Chronic allergic rhinitis Add astelin.  -     azelastine (ASTELIN) 0.1 % nasal spray; Place 1 spray into both nostrils 2 (two) times daily. Use in each nostril as directed   Return in about 6 months (around 01/17/2024) for chronic follow up.   The patient indicates understanding of these issues and agrees with the plan.  Gabriel Earing, FNP

## 2023-07-18 ENCOUNTER — Ambulatory Visit (INDEPENDENT_AMBULATORY_CARE_PROVIDER_SITE_OTHER): Payer: Medicare Other

## 2023-07-18 VITALS — Ht 68.0 in | Wt 177.0 lb

## 2023-07-18 DIAGNOSIS — Z0000 Encounter for general adult medical examination without abnormal findings: Secondary | ICD-10-CM | POA: Diagnosis not present

## 2023-07-18 NOTE — Patient Instructions (Signed)
Mitchell Chandler , Thank you for taking time to come for your Medicare Wellness Visit. I appreciate your ongoing commitment to your health goals. Please review the following plan we discussed and let me know if I can assist you in the future.   Referrals/Orders/Follow-Ups/Clinician Recommendations: Aim for 30 minutes of exercise or brisk walking, 6-8 glasses of water, and 5 servings of fruits and vegetables each day.   This is a list of the screening recommended for you and due dates:  Health Maintenance  Topic Date Due   DTaP/Tdap/Td vaccine (1 - Tdap) Never done   Pneumonia Vaccine (2 of 2 - PPSV23 or PCV20) 01/17/2024*   Flu Shot  03/01/2024*   COVID-19 Vaccine (5 - 2023-24 season) 08/01/2024*   Zoster (Shingles) Vaccine (1 of 2) 10/16/2024*   Medicare Annual Wellness Visit  07/17/2024   HPV Vaccine  Aged Out  *Topic was postponed. The date shown is not the original due date.    Advanced directives: (Copy Requested) Please bring a copy of your health care power of attorney and living will to the office to be added to your chart at your convenience.  Next Medicare Annual Wellness Visit scheduled for next year: Yes  Preventive Care 34 Years and Older, Male  Preventive care refers to lifestyle choices and visits with your health care provider that can promote health and wellness. What does preventive care include? A yearly physical exam. This is also called an annual well check. Dental exams once or twice a year. Routine eye exams. Ask your health care provider how often you should have your eyes checked. Personal lifestyle choices, including: Daily care of your teeth and gums. Regular physical activity. Eating a healthy diet. Avoiding tobacco and drug use. Limiting alcohol use. Practicing safe sex. Taking low doses of aspirin every day. Taking vitamin and mineral supplements as recommended by your health care provider. What happens during an annual well check? The services and  screenings done by your health care provider during your annual well check will depend on your age, overall health, lifestyle risk factors, and family history of disease. Counseling  Your health care provider may ask you questions about your: Alcohol use. Tobacco use. Drug use. Emotional well-being. Home and relationship well-being. Sexual activity. Eating habits. History of falls. Memory and ability to understand (cognition). Work and work Astronomer. Screening  You may have the following tests or measurements: Height, weight, and BMI. Blood pressure. Lipid and cholesterol levels. These may be checked every 5 years, or more frequently if you are over 65 years old. Skin check. Lung cancer screening. You may have this screening every year starting at age 4 if you have a 30-pack-year history of smoking and currently smoke or have quit within the past 15 years. Fecal occult blood test (FOBT) of the stool. You may have this test every year starting at age 59. Flexible sigmoidoscopy or colonoscopy. You may have a sigmoidoscopy every 5 years or a colonoscopy every 10 years starting at age 69. Prostate cancer screening. Recommendations will vary depending on your family history and other risks. Hepatitis C blood test. Hepatitis B blood test. Sexually transmitted disease (STD) testing. Diabetes screening. This is done by checking your blood sugar (glucose) after you have not eaten for a while (fasting). You may have this done every 1-3 years. Abdominal aortic aneurysm (AAA) screening. You may need this if you are a current or former smoker. Osteoporosis. You may be screened starting at age 32 if you are  at high risk. Talk with your health care provider about your test results, treatment options, and if necessary, the need for more tests. Vaccines  Your health care provider may recommend certain vaccines, such as: Influenza vaccine. This is recommended every year. Tetanus, diphtheria, and  acellular pertussis (Tdap, Td) vaccine. You may need a Td booster every 10 years. Zoster vaccine. You may need this after age 5. Pneumococcal 13-valent conjugate (PCV13) vaccine. One dose is recommended after age 41. Pneumococcal polysaccharide (PPSV23) vaccine. One dose is recommended after age 53. Talk to your health care provider about which screenings and vaccines you need and how often you need them. This information is not intended to replace advice given to you by your health care provider. Make sure you discuss any questions you have with your health care provider. Document Released: 12/15/2015 Document Revised: 08/07/2016 Document Reviewed: 09/19/2015 Elsevier Interactive Patient Education  2017 [employer].  Fall Prevention in the Home Falls can cause injuries. They can happen to people of all ages. There are many things you can do to make your home safe and to help prevent falls. What can I do on the outside of my home? Regularly fix the edges of walkways and driveways and fix any cracks. Remove anything that might make you trip as you walk through a door, such as a raised step or threshold. Trim any bushes or trees on the path to your home. Use bright outdoor lighting. Clear any walking paths of anything that might make someone trip, such as rocks or tools. Regularly check to see if handrails are loose or broken. Make sure that both sides of any steps have handrails. Any raised decks and porches should have guardrails on the edges. Have any leaves, snow, or ice cleared regularly. Use sand or salt on walking paths during winter. Clean up any spills in your garage right away. This includes oil or grease spills. What can I do in the bathroom? Use night lights. Install grab bars by the toilet and in the tub and shower. Do not use towel bars as grab bars. Use non-skid mats or decals in the tub or shower. If you need to sit down in the shower, use a plastic, non-slip stool. Keep  the floor dry. Clean up any water that spills on the floor as soon as it happens. Remove soap buildup in the tub or shower regularly. Attach bath mats securely with double-sided non-slip rug tape. Do not have throw rugs and other things on the floor that can make you trip. What can I do in the bedroom? Use night lights. Make sure that you have a light by your bed that is easy to reach. Do not use any sheets or blankets that are too big for your bed. They should not hang down onto the floor. Have a firm chair that has side arms. You can use this for support while you get dressed. Do not have throw rugs and other things on the floor that can make you trip. What can I do in the kitchen? Clean up any spills right away. Avoid walking on wet floors. Keep items that you use a lot in easy-to-reach places. If you need to reach something above you, use a strong step stool that has a grab bar. Keep electrical cords out of the way. Do not use floor polish or wax that makes floors slippery. If you must use wax, use non-skid floor wax. Do not have throw rugs and other things on the floor  that can make you trip. What can I do with my stairs? Do not leave any items on the stairs. Make sure that there are handrails on both sides of the stairs and use them. Fix handrails that are broken or loose. Make sure that handrails are as long as the stairways. Check any carpeting to make sure that it is firmly attached to the stairs. Fix any carpet that is loose or worn. Avoid having throw rugs at the top or bottom of the stairs. If you do have throw rugs, attach them to the floor with carpet tape. Make sure that you have a light switch at the top of the stairs and the bottom of the stairs. If you do not have them, ask someone to add them for you. What else can I do to help prevent falls? Wear shoes that: Do not have high heels. Have rubber bottoms. Are comfortable and fit you well. Are closed at the toe. Do not  wear sandals. If you use a stepladder: Make sure that it is fully opened. Do not climb a closed stepladder. Make sure that both sides of the stepladder are locked into place. Ask someone to hold it for you, if possible. Clearly mark and make sure that you can see: Any grab bars or handrails. First and last steps. Where the edge of each step is. Use tools that help you move around (mobility aids) if they are needed. These include: Canes. Walkers. Scooters. Crutches. Turn on the lights when you go into a dark area. Replace any light bulbs as soon as they burn out. Set up your furniture so you have a clear path. Avoid moving your furniture around. If any of your floors are uneven, fix them. If there are any pets around you, be aware of where they are. Review your medicines with your doctor. Some medicines can make you feel dizzy. This can increase your chance of falling. Ask your doctor what other things that you can do to help prevent falls. This information is not intended to replace advice given to you by your health care provider. Make sure you discuss any questions you have with your health care provider. Document Released: 09/14/2009 Document Revised: 04/25/2016 Document Reviewed: 12/23/2014 Elsevier Interactive Patient Education  2017 [employer].

## 2023-07-18 NOTE — Progress Notes (Signed)
Subjective:   Mitchell Chandler is a 89 y.o. male who presents for Medicare Annual/Subsequent preventive examination.  Visit Complete: Virtual  I connected with  Jeanine Luz on 07/18/23 by a audio enabled telemedicine application and verified that I am speaking with the correct person using two identifiers.  Patient Location: Home  Provider Location: Home Office  I discussed the limitations of evaluation and management by telemedicine. The patient expressed understanding and agreed to proceed.  Patient Medicare AWV questionnaire was completed by the patient on 07/18/2023; I have confirmed that all information answered by patient is correct and no changes since this date.  Review of Systems    Vital Signs: Unable to obtain new vitals due to this being a telehealth visit.  Cardiac Risk Factors include: advanced age (>46men, >56 women);dyslipidemia;male gender;hypertension;sedentary lifestyle     Objective:    Today's Vitals   07/18/23 1035  Weight: 177 lb (80.3 kg)  Height: 5\' 8"  (1.727 m)   Body mass index is 26.91 kg/m.     07/18/2023   10:40 AM 07/16/2022   10:48 AM 06/14/2022   10:18 AM 07/13/2021    4:59 PM 08/08/2020   10:05 AM 08/02/2020   10:34 AM 01/26/2020    5:00 PM  Advanced Directives  Does Patient Have a Medical Advance Directive? Yes Yes No Yes No No No  Type of Estate agent of [place];Living will Healthcare Power of [place];Living will  Healthcare Power of [place];Living will     Copy of Healthcare Power of Attorney in Chart? No - copy requested No - copy requested  No - copy requested     Would patient like information on creating a medical advance directive?   No - Patient declined   Yes (ED - Information included in AVS) No - Patient declined    Current Medications (verified) Outpatient Encounter Medications as of 07/18/2023  Medication Sig   acetaminophen (TYLENOL) 500 MG tablet Take 500-1,000 mg by mouth every 6 (six) hours as needed  (for arthritis pain (neck/shoulder)).    aspirin 81 MG EC tablet Take 81 mg by mouth daily. Swallow whole.   azelastine (ASTELIN) 0.1 % nasal spray Place 1 spray into both nostrils 2 (two) times daily. Use in each nostril as directed   Cholecalciferol (VITAMIN D3) 250 MCG (10000 UT) CAPS Take 1,000 Units by mouth daily.   doxazosin (CARDURA) 8 MG tablet 1 tablet daily   fluticasone (FLONASE) 50 MCG/ACT nasal spray Place 2 sprays into both nostrils daily.   Folic Acid-Vit B6-Vit B12 (WESTAB ONE) 2.5-25-1 MG TABS tablet Take 1 tablet by mouth daily.   furosemide (LASIX) 20 MG tablet Take 1 tablet (20 mg total) by mouth daily.   levocetirizine (XYZAL) 5 MG tablet Take 1 tablet (5 mg total) by mouth every evening.   metoprolol tartrate (LOPRESSOR) 25 MG tablet Take 25 mg by mouth as needed.   simvastatin (ZOCOR) 20 MG tablet Take 1 tablet (20 mg total) by mouth at bedtime.   No facility-administered encounter medications on file as of 07/18/2023.    Allergies (verified) Patient has no known allergies.   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  Yearly surviellance.    Renal stone 01/26/2020   Vitamin D deficiency 08/13/2020   Past Surgical History:  Procedure Laterality Date   COLONOSCOPY  10/02/2011   Procedure: COLONOSCOPY;  Surgeon: Malissa Hippo, MD;  [facility];  Service: Endoscopy;  Laterality: N/A;  10:30 am   COLONOSCOPY N/A 12/21/2014   Procedure: COLONOSCOPY;  Surgeon: Malissa Hippo, MD;  [facility];  Service: Endoscopy;  Laterality: N/A;  1030   CORONARY ARTERY BYPASS GRAFT  1995   CYSTOSCOPY W/ URETERAL STENT PLACEMENT Left 01/26/2020   Procedure: CYSTOSCOPY WITH RETROGRADE  PYELOGRAM/URETERAL STENT PLACEMENT;  Surgeon: Sebastian Ache, MD;  Location: [facility];  Service: Urology;  Laterality: Left;   CYSTOSCOPY WITH RETROGRADE PYELOGRAM, URETEROSCOPY AND STENT PLACEMENT Left 11/19/2019   Procedure: CYSTOSCOPY WITH RETROGRADE PYELOGRAM, DIAGNOSTIC URETEROSCOPY AND STENT PLACEMENT;  Surgeon: Sebastian Ache, MD;  Location: [facility];  Service: Urology;  Laterality: Left;  1   EYE SURGERY     feb 7 feb 28 [facility]   KIDNEY STONE SURGERY     NEPHROLITHOTOMY Left 01/26/2020   Procedure: NEPHROLITHOTOMY PERCUTANEOUS;  Surgeon: Sebastian Ache, MD;  Location: [facility];  Service: Urology;  Laterality: Left;  3 HRS   PROSTATE BIOPSY     Family History  Problem Relation Age of Onset   Heart disease Mother    Hypertension Father    Hypertension Brother    Hypertension Brother    Hypertension Brother    Hypertension Brother    Social History   Socioeconomic History   Marital status: Married    Spouse name: Windell Moulding   Number of children: 1   Years of education: Not on file   Highest education level: Not on file  Occupational History   Occupation: retired  Tobacco Use   Smoking status: Former    Current packs/day: 0.00    Types: Cigarettes    Quit date: 12/21/1962    Years since quitting: 60.6   Smokeless tobacco: Never  Vaping Use   Vaping status: Never Used  Substance and Sexual Activity   Alcohol use: No   Drug use: No   Sexual activity: Not on file  Other Topics Concern   Not on file  Social History Narrative   He and his wife Windell Moulding are living with their daughter. He stays active, mowing, yard work, eating out with friends, etc.   Social Determinants of Health   Financial Resource Strain: Low Risk  (07/18/2023)   Overall Financial Resource Strain (CARDIA)    Difficulty of Paying Living Expenses: Not hard at all  Food Insecurity: No Food Insecurity (07/18/2023)   Hunger Vital Sign    Worried About Running Out of Food in the Last Year:  Never true    Ran Out of Food in the Last Year: Never true  Transportation Needs: No Transportation Needs (07/18/2023)   PRAPARE - Administrator, Civil Service (Medical): No    Lack of Transportation (Non-Medical): No  Physical Activity: Insufficiently Active (07/18/2023)   Exercise Vital Sign    Days of Exercise per Week: 3 days    Minutes of Exercise per Session: 30 min  Stress: No Stress Concern Present (07/18/2023)   [employer] of Occupational Health - Occupational Stress Questionnaire    Feeling of Stress : Not at all  Social Connections: Moderately Integrated (07/18/2023)   Social Connection and Isolation Panel [NHANES]    Frequency of Communication with Friends and Family: More than  three times a week    Frequency of Social Gatherings with Friends and Family: More than three times a week    Attends Religious Services: More than 4 times per year    Active Member of [employer] or Organizations: No    Attends Engineer, structural: Never    Marital Status: Married    Tobacco Counseling Counseling given: Not Answered   Clinical Intake:  Pre-visit preparation completed: Yes  Pain : No/denies pain     Nutritional Risks: None Diabetes: No  How often do you need to have someone help you when you read instructions, pamphlets, or other written materials from your doctor or pharmacy?: 1 - Never  Interpreter Needed?: No  Information entered by :: Renie Ora, LPN   Activities of Daily Living    07/18/2023   10:40 AM  In your present state of health, do you have any difficulty performing the following activities:  Hearing? 0  Vision? 0  Difficulty concentrating or making decisions? 0  Walking or climbing stairs? 0  Dressing or bathing? 0  Doing errands, shopping? 0  Preparing Food and eating ? N  Using the Toilet? N  In the past six months, have you accidently leaked urine? N  Do you have problems with loss of bowel control? N  Managing your  Medications? N  Managing your Finances? N  Housekeeping or managing your Housekeeping? N    Patient Care Team: Gabriel Earing, FNP as PCP - General (Family Medicine) Berneice Heinrich Delbert Phenix., MD as Consulting Physician (Urology)  Indicate any recent Medical Services you may have received from other than [facility] providers in the past year (date may be approximate).     Assessment:   This is a routine wellness examination for [place].  Hearing/Vision screen Vision Screening - Comments:: Wears rx glasses - up to date with routine eye exams with  Dr.Davis   Dietary issues and exercise activities discussed:     Goals Addressed             This Visit's Progress    LIFESTYLE - DECREASE FALLS RISK   On track      Depression Screen    07/18/2023   10:38 AM 07/17/2023    2:22 PM 04/16/2023    2:34 PM 01/16/2023    2:46 PM 10/10/2022    3:57 PM 07/16/2022   10:49 AM 07/10/2022    1:31 PM  PHQ 2/9 Scores  PHQ - 2 Score 0 0 0 0 0 0 3  PHQ- 9 Score 0 2 3 3 2  0 11    Fall Risk    07/18/2023   10:37 AM 07/17/2023    2:22 PM 04/16/2023    2:33 PM 10/10/2022    3:57 PM 07/16/2022   10:45 AM  Fall Risk   Falls in the past year? 0 0 0 0 0  Number falls in past yr: 0    0  Injury with Fall? 0    0  Risk for fall due to : No Fall Risks    Impaired balance/gait;Orthopedic patient  Follow up Falls prevention discussed    Falls prevention discussed;Education provided    MEDICARE RISK AT HOME:  Medicare Risk at Home - 07/18/23 1037     Any stairs in or around the home? No    If so, are there any without handrails? No    Home free of loose throw rugs in walkways, pet beds, electrical cords, etc? Yes  Adequate lighting in your home to reduce risk of falls? Yes    Life alert? No    Use of a cane, walker or w/c? No    Grab bars in the bathroom? Yes    Shower chair or bench in shower? Yes    Elevated toilet seat or a handicapped toilet? Yes             TIMED UP AND GO:  Was the test  performed?  No    Cognitive Function:        07/18/2023   10:40 AM 07/16/2022   10:52 AM 07/13/2021    5:00 PM  6CIT Screen  What Year? 0 points 0 points 0 points  What month? 0 points 0 points 0 points  What time? 0 points 0 points 0 points  Count back from 20 0 points 0 points 0 points  Months in reverse 0 points 0 points 0 points  Repeat phrase 0 points 0 points 2 points  Total Score 0 points 0 points 2 points    Immunizations Immunization History  Administered Date(s) Administered   Covid-19, Mrna,Vaccine(Spikevax)84yrs and older 11/29/2022   Fluad Quad(high Dose 65+) 09/19/2017, 09/07/2018, 09/07/2020, 10/10/2022   Influenza, High Dose Seasonal PF 09/19/2017, 09/07/2018   Influenza,inj,Quad PF,6+ Mos 09/06/2021   Influenza-Unspecified 09/01/2016, 09/19/2017, 09/07/2018, 09/08/2019   Moderna Sars-Covid-2 Vaccination 01/05/2020, 02/02/2020, 10/03/2020   Pneumococcal Conjugate-13 12/02/2006   Pneumococcal-Unspecified 09/01/2016    TDAP status: Due, Education has been provided regarding the importance of this vaccine. Advised may receive this vaccine at local pharmacy or Health Dept. Aware to provide a copy of the vaccination record if obtained from local pharmacy or Health Dept. Verbalized acceptance and understanding.  Flu Vaccine status: Up to date  Pneumococcal vaccine status: Up to date  Covid-19 vaccine status: Completed vaccines  Qualifies for Shingles Vaccine? Yes   Zostavax completed No   Shingrix Completed?: No.    Education has been provided regarding the importance of this vaccine. Patient has been advised to call insurance company to determine out of pocket expense if they have not yet received this vaccine. Advised may also receive vaccine at local pharmacy or Health Dept. Verbalized acceptance and understanding.  Screening Tests Health Maintenance  Topic Date Due   DTaP/Tdap/Td (1 - Tdap) Never done   Pneumonia Vaccine 4+ Years old (2 of 2 - PPSV23 or  PCV20) 01/17/2024 (Originally 12/03/2007)   INFLUENZA VACCINE  03/01/2024 (Originally 07/03/2023)   COVID-19 Vaccine (5 - 2023-24 season) 08/01/2024 (Originally 03/31/2023)   Zoster Vaccines- Shingrix (1 of 2) 10/16/2024 (Originally 04/17/1984)   Medicare Annual Wellness (AWV)  07/17/2024   HPV VACCINES  Aged Out    Health Maintenance  Health Maintenance Due  Topic Date Due   DTaP/Tdap/Td (1 - Tdap) Never done    Colorectal cancer screening: No longer required.   Lung Cancer Screening: (Low Dose CT Chest recommended if Age 39-80 years, 20 pack-year currently smoking OR have quit w/in 15years.) does not qualify.   Lung Cancer Screening Referral: n/a  Additional Screening:  Hepatitis C Screening: does not qualify;   Vision Screening: Recommended annual ophthalmology exams for early detection of glaucoma and other disorders of the eye. Is the patient up to date with their annual eye exam?  No  Who is the provider or what is the name of the office in which the patient attends annual eye exams? Dr.Davis  If pt is not established with a provider, would they like to be  referred to a provider to establish care? No .   Dental Screening: Recommended annual dental exams for proper oral hygiene    Community Resource Referral / Chronic Care Management: CRR required this visit?  No   CCM required this visit?  No     Plan:     I have personally reviewed and noted the following in the patient's chart:   Medical and social history Use of alcohol, tobacco or illicit drugs  Current medications and supplements including opioid prescriptions. Patient is not currently taking opioid prescriptions. Functional ability and status Nutritional status Physical activity Advanced directives List of other physicians Hospitalizations, surgeries, and ER visits in previous 12 months Vitals Screenings to include cognitive, depression, and falls Referrals and appointments  In addition, I have  reviewed and discussed with patient certain preventive protocols, quality metrics, and best practice recommendations. A written personalized care plan for preventive services as well as general preventive health recommendations were provided to patient.     Lorrene Reid, LPN   [id number]   After Visit Summary: (MyChart) Due to this being a telephonic visit, the after visit summary with patients personalized plan was offered to patient via MyChart   Nurse Notes: Due Tdap Vaccine

## 2023-07-22 ENCOUNTER — Other Ambulatory Visit: Payer: Medicare Other

## 2023-07-22 DIAGNOSIS — E785 Hyperlipidemia, unspecified: Secondary | ICD-10-CM

## 2023-07-22 DIAGNOSIS — I10 Essential (primary) hypertension: Secondary | ICD-10-CM

## 2023-07-22 DIAGNOSIS — I25810 Atherosclerosis of coronary artery bypass graft(s) without angina pectoris: Secondary | ICD-10-CM

## 2023-07-22 LAB — CBC WITH DIFFERENTIAL/PLATELET
Basophils Absolute: 0 10*3/uL (ref 0.0–0.2)
Basos: 0 %
EOS (ABSOLUTE): 0.2 10*3/uL (ref 0.0–0.4)
Eos: 2 %
Hematocrit: 48.5 % (ref 37.5–51.0)
Hemoglobin: 16.4 g/dL (ref 13.0–17.7)
Immature Grans (Abs): 0 10*3/uL (ref 0.0–0.1)
Immature Granulocytes: 0 %
Lymphocytes Absolute: 1.9 10*3/uL (ref 0.7–3.1)
Lymphs: 31 %
MCH: 32.1 pg (ref 26.6–33.0)
MCHC: 33.8 g/dL (ref 31.5–35.7)
MCV: 95 fL (ref 79–97)
Monocytes Absolute: 0.3 10*3/uL (ref 0.1–0.9)
Monocytes: 5 %
Neutrophils Absolute: 3.8 10*3/uL (ref 1.4–7.0)
Neutrophils: 62 %
Platelets: 188 10*3/uL (ref 150–450)
RBC: 5.11 x10E6/uL (ref 4.14–5.80)
RDW: 13 % (ref 11.6–15.4)
WBC: 6.1 10*3/uL (ref 3.4–10.8)

## 2023-07-22 LAB — CMP14+EGFR
ALT: 14 IU/L (ref 0–44)
AST: 21 IU/L (ref 0–40)
Albumin: 4.2 g/dL (ref 3.7–4.7)
Alkaline Phosphatase: 72 IU/L (ref 44–121)
BUN/Creatinine Ratio: 17 (ref 10–24)
BUN: 18 mg/dL (ref 8–27)
Bilirubin Total: 0.8 mg/dL (ref 0.0–1.2)
CO2: 22 mmol/L (ref 20–29)
Calcium: 9.8 mg/dL (ref 8.6–10.2)
Chloride: 105 mmol/L (ref 96–106)
Creatinine, Ser: 1.09 mg/dL (ref 0.76–1.27)
Globulin, Total: 2.9 g/dL (ref 1.5–4.5)
Glucose: 93 mg/dL (ref 70–99)
Potassium: 4.2 mmol/L (ref 3.5–5.2)
Sodium: 143 mmol/L (ref 134–144)
Total Protein: 7.1 g/dL (ref 6.0–8.5)
eGFR: 65 mL/min/{1.73_m2} (ref >59)

## 2023-07-22 LAB — LIPID PANEL
Chol/HDL Ratio: 2.8 ratio (ref 0.0–5.0)
Cholesterol, Total: 146 mg/dL (ref 100–199)
HDL: 53 mg/dL (ref >39)
LDL Chol Calc (NIH): 64 mg/dL (ref 0–99)
Triglycerides: 175 mg/dL — ABNORMAL HIGH (ref 0–149)
VLDL Cholesterol Cal: 29 mg/dL (ref 5–40)

## 2023-10-14 ENCOUNTER — Encounter: Payer: Self-pay | Admitting: *Deleted

## 2023-10-20 NOTE — Progress Notes (Unsigned)
  Cardiology Office Note:   Date:  10/22/2023  ID:  Mitchell Chandler, DOB [birth year], MRN [id number] PCP: Gabriel Earing, FNP  [facility] Providers Cardiologist:  None {  History of Present Illness:   Mitchell Chandler is a 89 y.o. male who presents for evaluation of a murmur.  He had a history of bypass in 1995 by Dr. Andrey Campanile.  He said 6 vessels.  At the last visit in May 2024 he had mild to moderate AS.  Since I last saw her she has done well.     The patient denies any new symptoms such as chest discomfort, neck or arm discomfort. There has been no new shortness of breath, PND or orthopnea. There have been no reported palpitations, presyncope or syncope.   He gets around slowly with fatigue.    He says that his BP is well controlled but if it goes up he takes metoprolol and his BP comes back down.    ROS: As stated in the HPI and negative for all other systems.  Studies Reviewed:    EKG:   NA  Risk Assessment/Calculations:        Physical Exam:   VS:  BP (!) 142/70   Pulse 96   Ht 5\' 8"  (1.727 m)   Wt 180 lb (81.6 kg)   BMI 27.37 kg/m    Wt Readings from Last 3 Encounters:  10/22/23 180 lb (81.6 kg)  07/18/23 177 lb (80.3 kg)  07/17/23 177 lb 6 oz (80.5 kg)     GEN: Well nourished, well developed in no acute distress NECK: No JVD; No carotid bruits CARDIAC: RRR, 3/6 apical systolic murmur, no diastolic murmurs, rubs, gallops RESPIRATORY:  Clear to auscultation without rales, wheezing or rhonchi  ABDOMEN: Soft, non-tender, non-distended EXTREMITIES:  No edema; No deformity   ASSESSMENT AND PLAN:   AS: This is moderate.  I will follow this clinically.  He will get an echocardiogram in the spring.  At this point no change in therapy.  Dyslipidemia: LDL was 64 with an HDL of 53.  No change in therapy.    HTN: The blood pressure is minimally elevated.    I think it is fine to remain on the meds as listed.   CAD:    His bypass grafts will be 89 years old next year but  he does very well.  No change in therapy.  Follow up with me in one year.   Signed, Rollene Rotunda, MD

## 2023-10-22 ENCOUNTER — Ambulatory Visit (INDEPENDENT_AMBULATORY_CARE_PROVIDER_SITE_OTHER): Payer: Medicare Other | Admitting: Cardiology

## 2023-10-22 ENCOUNTER — Encounter: Payer: Self-pay | Admitting: Cardiology

## 2023-10-22 VITALS — BP 142/70 | HR 96 | Ht 68.0 in | Wt 180.0 lb

## 2023-10-22 DIAGNOSIS — I25810 Atherosclerosis of coronary artery bypass graft(s) without angina pectoris: Secondary | ICD-10-CM

## 2023-10-22 DIAGNOSIS — R011 Cardiac murmur, unspecified: Secondary | ICD-10-CM

## 2023-10-22 DIAGNOSIS — I10 Essential (primary) hypertension: Secondary | ICD-10-CM | POA: Diagnosis not present

## 2023-10-22 DIAGNOSIS — E785 Hyperlipidemia, unspecified: Secondary | ICD-10-CM

## 2023-10-22 NOTE — Patient Instructions (Signed)
Medication Instructions:  The current medical regimen is effective;  continue present plan and medications.  *If you need a refill on your cardiac medications before your next appointment, please call your pharmacy*  Testing: Your physician has requested that you have an echocardiogram. Echocardiography is a painless test that uses sound waves to create images of your heart. It provides your doctor with information about the size and shape of your heart and how well your heart's chambers and valves are working. This procedure takes approximately one hour. There are no restrictions for this procedure. Please do NOT wear cologne, perfume, aftershave, or lotions (deodorant is allowed). Please arrive 15 minutes prior to your appointment time.  Please note: We ask at that you not bring children with you during ultrasound (echo/ vascular) testing. Due to room size and safety concerns, children are not allowed in the ultrasound rooms during exams. Our front office staff cannot provide observation of children in our lobby area while testing is being conducted. An adult accompanying a patient to their appointment will only be allowed in the ultrasound room at the discretion of the ultrasound technician under special circumstances. We apologize for any inconvenience. This will be completed in May 2025 at [facility].  You will be contacted to be scheduled.  Follow-Up: At [facility], you and your health needs are our priority.  As part of our continuing mission to provide you with exceptional heart care, we have created designated Provider Care Teams.  These Care Teams include your primary Cardiologist (physician) and Advanced Practice Providers (APPs -  Physician Assistants and Nurse Practitioners) who all work together to provide you with the care you need, when you need it.  We recommend signing up for the patient portal called "MyChart".  Sign up information is provided on this After Visit  Summary.  MyChart is used to connect with patients for Virtual Visits (Telemedicine).  Patients are able to view lab/test results, encounter notes, upcoming appointments, etc.  Non-urgent messages can be sent to your provider as well.   To learn more about what you can do with MyChart, go to [email or web address].    Your next appointment:   1 year(s)  Provider:   Rollene Rotunda, MD

## 2023-10-28 ENCOUNTER — Other Ambulatory Visit: Payer: Self-pay | Admitting: Family Medicine

## 2023-12-05 ENCOUNTER — Other Ambulatory Visit: Payer: Self-pay | Admitting: Family Medicine

## 2023-12-05 DIAGNOSIS — E782 Mixed hyperlipidemia: Secondary | ICD-10-CM

## 2024-01-03 ENCOUNTER — Other Ambulatory Visit: Payer: Self-pay | Admitting: Family Medicine

## 2024-01-19 ENCOUNTER — Ambulatory Visit: Payer: Medicare Other | Admitting: Family Medicine

## 2024-01-22 ENCOUNTER — Ambulatory Visit: Payer: Medicare Other | Admitting: Family Medicine

## 2024-01-24 ENCOUNTER — Other Ambulatory Visit: Payer: Self-pay | Admitting: Family Medicine

## 2024-01-24 DIAGNOSIS — J309 Allergic rhinitis, unspecified: Secondary | ICD-10-CM

## 2024-01-26 ENCOUNTER — Other Ambulatory Visit: Payer: Self-pay | Admitting: Family Medicine

## 2024-01-29 ENCOUNTER — Emergency Department (HOSPITAL_COMMUNITY)
Admission: EM | Admit: 2024-01-29 | Discharge: 2024-01-29 | Disposition: A | Discharge status: Home / Self Care | Payer: Medicare Other

## 2024-01-29 ENCOUNTER — Other Ambulatory Visit: Payer: Self-pay

## 2024-01-29 DIAGNOSIS — Z7982 Long term (current) use of aspirin: Secondary | ICD-10-CM | POA: Diagnosis not present

## 2024-01-29 DIAGNOSIS — Z79899 Other long term (current) drug therapy: Secondary | ICD-10-CM | POA: Insufficient documentation

## 2024-01-29 DIAGNOSIS — R531 Weakness: Secondary | ICD-10-CM | POA: Insufficient documentation

## 2024-01-29 DIAGNOSIS — I2510 Atherosclerotic heart disease of native coronary artery without angina pectoris: Secondary | ICD-10-CM | POA: Diagnosis not present

## 2024-01-29 DIAGNOSIS — I10 Essential (primary) hypertension: Secondary | ICD-10-CM | POA: Diagnosis not present

## 2024-01-29 LAB — COMPREHENSIVE METABOLIC PANEL
ALT: 10 U/L (ref 0–44)
AST: 21 U/L (ref 15–41)
Albumin: 3.5 g/dL (ref 3.5–5.0)
Alkaline Phosphatase: 56 U/L (ref 38–126)
Anion gap: 12 (ref 5–15)
BUN: 11 mg/dL (ref 8–23)
CO2: 19 mmol/L — ABNORMAL LOW (ref 22–32)
Calcium: 9.1 mg/dL (ref 8.9–10.3)
Chloride: 107 mmol/L (ref 98–111)
Creatinine, Ser: 0.99 mg/dL (ref 0.61–1.24)
GFR, Estimated: >60 mL/min (ref >60)
Glucose, Bld: 97 mg/dL (ref 70–99)
Potassium: 3.8 mmol/L (ref 3.5–5.1)
Sodium: 138 mmol/L (ref 135–145)
Total Bilirubin: 0.9 mg/dL (ref 0.0–1.2)
Total Protein: 6.8 g/dL (ref 6.5–8.1)

## 2024-01-29 LAB — CBC
HCT: 45.2 % (ref 39.0–52.0)
Hemoglobin: 15.3 g/dL (ref 13.0–17.0)
MCH: 31.9 pg (ref 26.0–34.0)
MCHC: 33.8 g/dL (ref 30.0–36.0)
MCV: 94.4 fL (ref 80.0–100.0)
Platelets: 203 10*3/uL (ref 150–400)
RBC: 4.79 MIL/uL (ref 4.22–5.81)
RDW: 13.3 % (ref 11.5–15.5)
WBC: 6.5 10*3/uL (ref 4.0–10.5)
nRBC: 0 % (ref 0.0–0.2)

## 2024-01-29 LAB — URINALYSIS, ROUTINE W REFLEX MICROSCOPIC
Bilirubin Urine: NEGATIVE
Glucose, UA: NEGATIVE mg/dL
Ketones, ur: 20 mg/dL — AB
Leukocytes,Ua: NEGATIVE
Nitrite: NEGATIVE
Protein, ur: 100 mg/dL — AB
Specific Gravity, Urine: 1.019 (ref 1.005–1.030)
pH: 5 (ref 5.0–8.0)

## 2024-01-29 NOTE — ED Notes (Signed)
Pt is hard of hearing  

## 2024-01-29 NOTE — ED Notes (Signed)
Attempted x 2 for IV access without success.  

## 2024-01-29 NOTE — ED Provider Notes (Signed)
 [place] EMERGENCY DEPARTMENT AT [facility] Provider Note   CSN: [id number] Arrival date & time: 01/29/24  1427     History  Chief Complaint  Patient presents with   Weakness    Mitchell Chandler PO is a 89 y.o. male.  This is a 89 year old male presenting emergency department for generalized weakness and unsteady gait which has been progressively worsening over the past several months.  No reported falls.  No specific complaint other than he feels weak in his lower extremities.  No chest pain, no shortness of breath no abdominal pain.   Weakness      Home Medications Prior to Admission medications   Medication Sig Start Date End Date Taking? Authorizing Provider  acetaminophen (TYLENOL) 500 MG tablet Take 500-1,000 mg by mouth every 6 (six) hours as needed (for arthritis pain (neck/shoulder)).     [provider]  aspirin 81 MG EC tablet Take 81 mg by mouth daily. Swallow whole.    [provider]  azelastine (ASTELIN) 0.1 % nasal spray Place 1 spray into both nostrils 2 (two) times daily. Use in each nostril as directed 07/17/23   Gabriel Earing, FNP  Cholecalciferol (VITAMIN D3) 250 MCG (10000 UT) CAPS Take 1,000 Units by mouth daily.    [provider]  doxazosin (CARDURA) 8 MG tablet 1 tablet daily 04/16/23   Gabriel Earing, FNP  fluticasone [facility]) 50 MCG/ACT nasal spray Place 2 sprays into both nostrils daily. 01/16/23   Gabriel Earing, FNP  furosemide (LASIX) 20 MG tablet Take 1 tablet (20 mg total) by mouth daily. 04/16/23   Gabriel Earing, FNP  levocetirizine (XYZAL) 5 MG tablet TAKE ONE TABLET EVERY EVENING 01/26/24   Gabriel Earing, FNP  metoprolol tartrate (LOPRESSOR) 25 MG tablet TAKE ONE TABLET ONCE DAILY 01/26/24   Gabriel Earing, FNP  simvastatin (ZOCOR) 20 MG tablet TAKE ONE TABLET BY MOUTH AT BEDTIME 12/05/23   Gabriel Earing, FNP  WESTAB ONE 2.5-25-1 MG TABS tablet TAKE ONE TABLET BY MOUTH DAILY 01/05/24   Gabriel Earing, FNP      Allergies    Patient has no known allergies.    Review of Systems   Review of Systems  Neurological:  Positive for weakness.    Physical Exam Updated Vital Signs BP (!) 168/76 (BP Location: Right Arm)   Pulse 68   Temp 98.2 F (36.8 C) (Oral)   Resp 18   SpO2 98%  Physical Exam Vitals and nursing note reviewed.  Constitutional:      General: He is not in acute distress.    Appearance: He is not toxic-appearing.  HENT:     Nose: Nose normal.     Mouth/Throat:     Mouth: Mucous membranes are moist.  Eyes:     Conjunctiva/sclera: Conjunctivae normal.  Cardiovascular:     Rate and Rhythm: Normal rate and regular rhythm.  Pulmonary:     Effort: Pulmonary effort is normal.  Abdominal:     General: Abdomen is flat. There is no distension.     Tenderness: There is no abdominal tenderness. There is no guarding or rebound.  Musculoskeletal:     Right lower leg: No edema.     Left lower leg: No edema.     Comments: Global weakness in bilateral lower extremities.  Warm and well-perfused.  Equal pulses.  Skin:    General: Skin is warm.     Capillary Refill: Capillary refill takes  less than 2 seconds.  Neurological:     Mental Status: He is alert and oriented to person, place, and time.  Psychiatric:        Mood and Affect: Mood normal.        Behavior: Behavior normal.     ED Results / Procedures / Treatments   Labs (all labs ordered are listed, but only abnormal results are displayed) Labs Reviewed  COMPREHENSIVE METABOLIC PANEL - Abnormal; Notable for the following components:      Result Value   CO2 19 (*)    All other components within normal limits  URINALYSIS, ROUTINE W REFLEX MICROSCOPIC - Abnormal; Notable for the following components:   Hgb urine dipstick MODERATE (*)    Ketones, ur 20 (*)    Protein, ur 100 (*)    Bacteria, UA RARE (*)    All other components within normal limits  CBC    EKG EKG  Interpretation Date/Time:  Thursday January 29 2024 15:27:42 EST Ventricular Rate:  72 PR Interval:  312 QRS Duration:  168 QT Interval:  444 QTC Calculation: 486 R Axis:   -9  Text Interpretation: Sinus rhythm with 1st degree A-V block Left bundle branch block Abnormal ECG When compared with ECG of 08-Aug-2020 11:50, Left bundle branch block is now Present Confirmed by Estanislado Pandy [id number]) on 01/29/2024 3:32:36 PM  Radiology No results found.  Procedures Procedures    Medications Ordered in ED Medications - No data to display  ED Course/ Medical Decision Making/ A&P Clinical Course as of 01/29/24 2336  Thu Jan 29, 2024  1631 Daughter presented to bedside.  Confirmed that patient's weakness seemingly progressive over the past several months.  He did complain of some flank pain a few days ago, and reports that he does have a history of stones.  He is not having any abdominal pain currently.  No fevers chills.  She is concerned that he lives alone and that he cannot get up and care for himself any longer and is requesting placement.  I had a lengthy discussion with daughter about the challenges of this in the emergency department, but daughter notes that she is currently caring for her mother full time and is unable to also care for her father at this time. [TY]  1727 After further deliberation, family states they would like to go home with home health/PT and possibly try to find placement at home.  Awaiting urine.  Other labs reassuring; no leukocytosis.  No anemia.  No metabolic derangements.  Normal kidney function.  No transaminitis to suggest hepatobiliary disease.  UA negative.  Will discharge with family with PT/OT/home health [TY]    Clinical Course User Index [TY] Coral Spikes, DO                                 Medical Decision Making This is an 89 year old male with with history of hypertension, HLD, CAD presents today with generalized weakness and ambulatory  dysfunction.  Afebrile nontachycardic, is hypertensive.  Symptoms are seemingly progressive over the past several months.  No reported falls.  Largely asymptomatic in bed.  Will get basic screening labs; however, suspect symptoms are age-related/deconditioning.  See ED course for final MDM/disposition.  Amount and/or Complexity of Data Reviewed Labs: ordered. ECG/medicine tests: ordered.         Final Clinical Impression(s) / ED Diagnoses Final diagnoses:  Generalized weakness  Rx / DC Orders ED Discharge Orders     None         Coral Spikes, DO 01/29/24 2336

## 2024-01-29 NOTE — Discharge Instructions (Signed)
 Your labs, vital signs and urine were all reassuring.  No metabolic derangements or signs of infection.  As such, we feel that you are safe for discharge. We have put a referral you to PT/OT, home health and social work. Please follow up with your primary doctor with further help with placement. Please return if you develop any new or worrisome symptoms such as fevers, sudden onset headache, vision changes, chest pain, shortness of breath, pass out.

## 2024-01-29 NOTE — ED Triage Notes (Signed)
 Pt arrived via [facility] from home c/o generalized weakness that has been ongoing for months, Per EMS, pts family member stated that his knees were a little wobbly and was worried he would fall, the generalized weakness seems to be getting worse. Pt A&Ox 4. Pt hard of hearing. Pt can stand with assistance

## 2024-02-06 ENCOUNTER — Encounter: Payer: Self-pay | Admitting: Family Medicine

## 2024-02-06 ENCOUNTER — Ambulatory Visit: Admitting: Family Medicine

## 2024-02-06 VITALS — BP 141/62 | HR 55 | Temp 98.1°F | Ht 68.0 in

## 2024-02-06 DIAGNOSIS — R54 Age-related physical debility: Secondary | ICD-10-CM | POA: Diagnosis not present

## 2024-02-06 DIAGNOSIS — I10 Essential (primary) hypertension: Secondary | ICD-10-CM | POA: Diagnosis not present

## 2024-02-06 DIAGNOSIS — I25810 Atherosclerosis of coronary artery bypass graft(s) without angina pectoris: Secondary | ICD-10-CM | POA: Diagnosis not present

## 2024-02-06 DIAGNOSIS — G8929 Other chronic pain: Secondary | ICD-10-CM

## 2024-02-06 DIAGNOSIS — Z87442 Personal history of urinary calculi: Secondary | ICD-10-CM

## 2024-02-06 DIAGNOSIS — R1030 Lower abdominal pain, unspecified: Secondary | ICD-10-CM

## 2024-02-06 DIAGNOSIS — R399 Unspecified symptoms and signs involving the genitourinary system: Secondary | ICD-10-CM

## 2024-02-06 DIAGNOSIS — N1831 Chronic kidney disease, stage 3a: Secondary | ICD-10-CM

## 2024-02-06 DIAGNOSIS — M542 Cervicalgia: Secondary | ICD-10-CM

## 2024-02-06 DIAGNOSIS — R5381 Other malaise: Secondary | ICD-10-CM

## 2024-02-06 DIAGNOSIS — R531 Weakness: Secondary | ICD-10-CM

## 2024-02-06 LAB — URINALYSIS, ROUTINE W REFLEX MICROSCOPIC
Bilirubin, UA: NEGATIVE
Glucose, UA: NEGATIVE
Ketones, UA: NEGATIVE
Leukocytes,UA: NEGATIVE
Nitrite, UA: NEGATIVE
Specific Gravity, UA: 1.025 (ref 1.005–1.030)
Urobilinogen, Ur: 0.2 mg/dL (ref 0.2–1.0)
pH, UA: 5.5 (ref 5.0–7.5)

## 2024-02-06 LAB — MICROSCOPIC EXAMINATION
Renal Epithel, UA: NONE SEEN /HPF
Yeast, UA: NONE SEEN

## 2024-02-06 MED ORDER — LIDOCAINE 5 % EX PTCH
1.0000 | MEDICATED_PATCH | CUTANEOUS | 3 refills | Status: DC
Start: 2024-02-06 — End: 2024-06-16

## 2024-02-06 NOTE — Progress Notes (Addendum)
 Acute Office Visit  Subjective:     Patient ID: Mitchell Chandler, male    DOB: Apr 23, 1932, 89 y.o.   MRN: [id number]  Chief Complaint  Patient presents with   Fatigue    HPI Here with daughter today who primarily provided the history. Patient is in today for generalized weakness, progressively worsening over the last few months. He is now requiring assistance with ambulation. Using a walker for ambulation. Stumbling a lot when walking with ambulation. Feels generally weak. Legs and arms feel heavy. Tingling in legs and arms when first starting to move after sitting. Requiring assistance for bathing now. He has not bathed in one week. He is able to feed himself but has a decreased appetite. He isn't drinking much fluids. He is having urinary frequency and this is bothersome for him. Because of this is has stopped taking his lasix and has cut back on his fluid intake. Has chronic urinary symptoms due to BPH. Also reports bilateral abdominal pain over the last 2 weeks. Denies dysuria, flank pain, fever. Has hx of kidney stones. Has been restless and uncomfortable. Complains on neck pain all the time. Hx of arthritis in neck. Takes tylenol 1000 mg QID without relief. He has been living with his daughter for the last week. He was seen in the ER on 01/29/24 for weakness. CMP, CBC, UA were unremarkable. EKG without significant abnormalities. Daughter would like a referral to palliative care. Would also like to discussed medication options for his chronic neck pain. Daughter will be reaching out to nursing homes for placement.    ROS As per HPI.      Objective:    BP (!) 142/60   Pulse (!) 55   Temp 98.1 F (36.7 C) (Temporal)   Ht 5\' 8"  (1.727 m)   SpO2 95%   BMI 27.37 kg/m  BP Readings from Last 3 Encounters:  02/06/24 (!) 141/62  01/29/24 (!) 168/76  10/22/23 (!) 142/70   Wt Readings from Last 3 Encounters:  10/22/23 180 lb (81.6 kg)  07/18/23 177 lb (80.3 kg)  07/17/23 177 lb 6 oz  (80.5 kg)       Physical Exam Vitals and nursing note reviewed.  Constitutional:      Appearance: He is not ill-appearing, toxic-appearing or diaphoretic.  HENT:     Mouth/Throat:     Mouth: Mucous membranes are moist.  Cardiovascular:     Rate and Rhythm: Normal rate and regular rhythm.     Heart sounds: Normal heart sounds. No murmur heard. Pulmonary:     Effort: Pulmonary effort is normal. No respiratory distress.     Breath sounds: Normal breath sounds. No wheezing, rhonchi or rales.  Chest:     Chest wall: No tenderness.  Abdominal:     General: Bowel sounds are normal. There is no distension.     Palpations: Abdomen is soft.     Tenderness: There is no abdominal tenderness. There is no right CVA tenderness, guarding or rebound.  Musculoskeletal:     Cervical back: Neck supple. No rigidity.     Right lower leg: No edema.     Left lower leg: No edema.  Skin:    General: Skin is warm and dry.  Neurological:     Mental Status: He is alert.     Motor: Weakness (generalized) present.     Gait: Gait abnormal (arrives in wheelchair).  Psychiatric:        Mood and Affect: Mood normal.  Speech: Speech normal.        Behavior: Behavior is cooperative.     Results for orders placed or performed in visit on 02/06/24  Microscopic Examination   Urine  Result Value Ref Range   WBC, UA 0-5 0 - 5 /hpf   RBC, Urine 0-2 0 - 2 /hpf   Epithelial Cells (non renal) 0-10 0 - 10 /hpf   Renal Epithel, UA None seen None seen /hpf   Bacteria, UA Few (A) None seen/Few   Yeast, UA None seen None seen  Urinalysis, Routine w reflex microscopic  Result Value Ref Range   Specific Gravity, UA 1.025 1.005 - 1.030   pH, UA 5.5 5.0 - 7.5   Color, UA Yellow Yellow   Appearance Ur Clear Clear   Leukocytes,UA Negative Negative   Protein,UA 3+ (A) Negative/Trace   Glucose, UA Negative Negative   Ketones, UA Negative Negative   RBC, UA 1+ (A) Negative   Bilirubin, UA Negative Negative    Urobilinogen, Ur 0.2 0.2 - 1.0 mg/dL   Nitrite, UA Negative Negative   Microscopic Examination See below:         Assessment & Plan:   Lj was seen today for fatigue.  Diagnoses and all orders for this visit:  Generalized weakness -     Amb Referral to Palliative Care  Frailty -     Amb Referral to Palliative Care  Physical deconditioning -     Amb Referral to Palliative Care  Primary hypertension -     Amb Referral to Palliative Care  Stage 3a chronic kidney disease (HCC) -     Amb Referral to Palliative Care  Coronary artery disease involving coronary bypass graft of native heart without angina pectoris -     Amb Referral to Palliative Care  Chronic neck pain -     lidocaine (LIDODERM) 5 %; Place 1 patch onto the skin daily. Remove & Discard patch within 12 hours or as directed by MD -     Amb Referral to Palliative Care  Lower abdominal pain -     Urinalysis, Routine w reflex microscopic -     Urine Culture  Lower urinary tract symptoms -     Urinalysis, Routine w reflex microscopic -     Urine Culture -     Microscopic Examination  History of kidney stones -     Urinalysis, Routine w reflex microscopic -     Microscopic Examination   Progressive deconditioning. Referral to palliative care discussed and placed. Daughter will contact nursing homes regarding placement. He has stopped taking lasix, no edema today on exam. Will call if edema occurs. Discussed lidocaine patches for neck pain. He has been taking maximum amount of tylenol daily. NSAIDs contraindicated due to CAD and CKD. Discussed low dose of tramadol if lidocaine patches do not provide relief. Discussed that tramadol can cause drowsiness, confusion and would increase his fall risk. Discussed that a fall could potentially lead to death. UA not compelling for infection today. Culture pending. Discussed CT to evaluate for renal stone. Declines today as it will be difficult to get him there. Will go to ER  for worsening symptoms, fever, flank pain, nausea, vomiting etc.   Keep scheduled follow up, sooner for new or worsening symptoms.   Total time spent caring for the patient today was 54 minutes. This includes time spent before the visit reviewing the chart, time spent during the visit, and time spent after the visit  on documentation.  Gabriel Earing, FNP

## 2024-02-07 LAB — URINE CULTURE

## 2024-02-09 ENCOUNTER — Telehealth: Payer: Self-pay | Admitting: Family Medicine

## 2024-02-09 ENCOUNTER — Telehealth: Payer: Self-pay

## 2024-02-09 ENCOUNTER — Other Ambulatory Visit (HOSPITAL_COMMUNITY): Payer: Self-pay

## 2024-02-09 ENCOUNTER — Encounter: Payer: Self-pay | Admitting: Family Medicine

## 2024-02-09 NOTE — Telephone Encounter (Addendum)
 Pharmacy Patient Advocate Encounter  Received notification from  [employer]   that Prior Authorization for Lidocaine 5% patches has been DENIED.  Full denial letter will be uploaded to the media tab. See denial reason below.  Pts plan only covers specific diagnosis, such as chronic back pain,  relief of pain associated with post-herpetic neuralgia etc. Please see below.     Lidocaine 4% is available OTC  PA #/Case ID/Reference #: CASE # [id number]

## 2024-02-09 NOTE — Telephone Encounter (Signed)
 Daughter Efraim Kaufmann) is requesting FL2 to be filled out for patient.   Can call Melissa when it is ready for pick up or can send to Admissions Direction Rona Ravens) at [facility].  Email: [email or web address]

## 2024-02-09 NOTE — Telephone Encounter (Signed)
 Typed up and placed on providers desk

## 2024-02-11 NOTE — Telephone Encounter (Signed)
 Email sent to [facility].

## 2024-02-12 ENCOUNTER — Other Ambulatory Visit (HOSPITAL_COMMUNITY): Payer: Self-pay

## 2024-02-12 ENCOUNTER — Ambulatory Visit: Payer: Medicare Other | Admitting: Family Medicine

## 2024-03-15 ENCOUNTER — Telehealth: Payer: Self-pay

## 2024-03-15 NOTE — Transitions of Care (Post Inpatient/ED Visit) (Signed)
   03/15/2024  Name: Mitchell Chandler MRN: [id number] DOB: 07/21/1932  Today's TOC FU Call Status: Today's TOC FU Call Status:: Successful TOC FU Call Completed TOC FU Call Complete Date: 03/15/24 Patient's Name and Date of Birth confirmed.  Transition Care Management Follow-up Telephone Call Date of Discharge: 03/12/24 Discharge Facility: Other ([facility] Facility) Name of Other ([facility]) Discharge Facility: [facility] Type of Discharge: Inpatient Admission How have you been since you were released from the hospital?: Better Any questions or concerns?: No  Items Reviewed: Did you receive and understand the discharge instructions provided?: Yes Medications obtained,verified, and reconciled?: Yes (Medications Reviewed) Any new allergies since your discharge?: No Dietary orders reviewed?: Yes Do you have support at home?: Yes People in Home [RPT]: child(ren), adult  Medications Reviewed Today: Medications Reviewed Today   Medications were not reviewed in this encounter     Home Care and Equipment/Supplies: Were Home Health Services Ordered?: Yes Name of Home Health Agency:: unknown Has Agency set up a time to come to your home?: Yes First Home Health Visit Date: 03/18/24 Any new equipment or medical supplies ordered?: NA  Functional Questionnaire: Do you need assistance with bathing/showering or dressing?: No Do you need assistance with meal preparation?: No Do you need assistance with eating?: No Do you have difficulty maintaining continence: No Do you need assistance with getting out of bed/getting out of a chair/moving?: No Do you have difficulty managing or taking your medications?: No  Follow up appointments reviewed: PCP Follow-up appointment confirmed?: Yes Date of PCP follow-up appointment?: 03/17/24 Follow-up Provider: [facility] Follow-up appointment confirmed?: NA Do you need transportation to your follow-up appointment?: No Do you understand  care options if your condition(s) worsen?: Yes-patient verbalized understanding    SIGNATURE Darrall Ellison, LPN [facility] Nurse Health Advisor Direct Dial [phone]

## 2024-03-17 ENCOUNTER — Encounter: Payer: Self-pay | Admitting: Family Medicine

## 2024-03-17 ENCOUNTER — Ambulatory Visit: Admitting: Family Medicine

## 2024-03-17 VITALS — BP 119/53 | HR 53 | Temp 97.2°F | Ht 68.0 in | Wt 184.4 lb

## 2024-03-17 DIAGNOSIS — Z79899 Other long term (current) drug therapy: Secondary | ICD-10-CM

## 2024-03-17 DIAGNOSIS — I10 Essential (primary) hypertension: Secondary | ICD-10-CM | POA: Diagnosis not present

## 2024-03-17 DIAGNOSIS — G629 Polyneuropathy, unspecified: Secondary | ICD-10-CM

## 2024-03-17 DIAGNOSIS — R531 Weakness: Secondary | ICD-10-CM

## 2024-03-17 DIAGNOSIS — N1831 Chronic kidney disease, stage 3a: Secondary | ICD-10-CM

## 2024-03-17 DIAGNOSIS — R54 Age-related physical debility: Secondary | ICD-10-CM

## 2024-03-17 DIAGNOSIS — R5381 Other malaise: Secondary | ICD-10-CM

## 2024-03-17 MED ORDER — PREGABALIN 25 MG PO CAPS: 25.0000 mg | ORAL_CAPSULE | Freq: Three times a day (TID) | ORAL | 2 refills | Status: DC

## 2024-03-17 NOTE — Progress Notes (Unsigned)
   Established Patient Office Visit  Subjective   Patient ID: Mitchell Chandler, male    DOB: 1932/01/22  Age: 89 y.o. MRN: [id number]  Chief Complaint  Patient presents with  . Transitions Of Care    HPI  Today's visit was for Transitional Care Management.  The patient was discharged from [facility] on 03/12/24  with a primary diagnosis of generalized weakness.   Contact with the patient and/or caregiver, by a clinical staff member, was made on 03/15/24 and was documented as a telephone encounter within the EMR.  Through chart review and discussion with the patient I have determined that management of their condition is of moderate complexity.   D/c home with daughter.   He did not lenjoy his time at [facility]. He was always cold and the food was cold.   Strength has improved. HH will be coming for an assessment for possible aide. Also will home PT/OT.  He was started on lyrica for neuropathy while there. Daughter hasn't noticed any side effects of lyrica and has noticed reduced complaints pain. Reports heaviness of legs still with achy pain. Numbness in feet. Taking tylenol TID and using lidocaine patch on neck. Neck pain improves for a few days, he increased his activity and then pain worsens for a few years.     Past Medical History:  Diagnosis Date  . Benign prostatic hyperplasia 12/25/2016  . 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 Elvan Hamel  . History of kidney stones   . Hypertension   . 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 Yearly surviellance.   . Renal stone 01/26/2020  . Vitamin D deficiency 08/13/2020      Review of Systems  Constitutional:  Negative for chills and fever.  Eyes:  Negative for double vision.  Cardiovascular:  Negative for chest pain, palpitations, orthopnea, claudication and leg swelling.  Genitourinary:  Negative for dysuria.  Musculoskeletal:  Negative  for falls.  Neurological:  Negative for dizziness, sensory change, speech change, focal weakness, seizures and loss of consciousness.      Objective:     BP (!) 119/53   Pulse (!) 53   Temp (!) 97.2 F (36.2 C) (Temporal)   Ht 5\' 8"  (1.727 m)   Wt 184 lb 6.4 oz (83.6 kg)   BMI 28.04 kg/m  Wt Readings from Last 3 Encounters:  03/17/24 184 lb 6.4 oz (83.6 kg)  10/22/23 180 lb (81.6 kg)  07/18/23 177 lb (80.3 kg)      Physical Exam   No results found for any visits on 03/17/24.  {Labs (Optional):23779}  The ASCVD Risk score (Arnett DK, et al., 2019) failed to calculate for the following reasons:   The 2019 ASCVD risk score is only valid for ages 60 to 27    Assessment & Plan:   Problem List Items Addressed This Visit   None   No follow-ups on file.    Albertha Huger, FNP

## 2024-03-22 ENCOUNTER — Telehealth: Payer: Self-pay

## 2024-03-22 NOTE — Telephone Encounter (Signed)
 Copied from CRM [id number]. Topic: Clinical - Home Health Verbal Orders >> Mar 19, 2024  2:07 PM Leory Rands wrote: Floretta Huron with [facility] is request verbal orders, skilled nursing OT & PT Frequency Nursing 1 W 4 OT & PT will call with their frequency [phone] Verbal Ok on VM  Please be advised that this is not our patient

## 2024-03-23 LAB — CBC WITH DIFFERENTIAL/PLATELET
Basophils Absolute: 0 10*3/uL (ref 0.0–0.2)
Basos: 0 %
EOS (ABSOLUTE): 0.6 10*3/uL — ABNORMAL HIGH (ref 0.0–0.4)
Eos: 9 %
Hematocrit: 43.8 % (ref 37.5–51.0)
Hemoglobin: 14.7 g/dL (ref 13.0–17.7)
Immature Grans (Abs): 0 10*3/uL (ref 0.0–0.1)
Immature Granulocytes: 0 %
Lymphocytes Absolute: 2.1 10*3/uL (ref 0.7–3.1)
Lymphs: 31 %
MCH: 32.1 pg (ref 26.6–33.0)
MCHC: 33.6 g/dL (ref 31.5–35.7)
MCV: 96 fL (ref 79–97)
Monocytes Absolute: 0.5 10*3/uL (ref 0.1–0.9)
Monocytes: 7 %
Neutrophils Absolute: 3.6 10*3/uL (ref 1.4–7.0)
Neutrophils: 53 %
Platelets: 201 10*3/uL (ref 150–450)
RBC: 4.58 x10E6/uL (ref 4.14–5.80)
RDW: 13 % (ref 11.6–15.4)
WBC: 6.9 10*3/uL (ref 3.4–10.8)

## 2024-03-23 LAB — BMP8+EGFR
BUN/Creatinine Ratio: 14 (ref 10–24)
BUN: 20 mg/dL (ref 8–27)
CO2: 20 mmol/L (ref 20–29)
Calcium: 9.3 mg/dL (ref 8.6–10.2)
Chloride: 108 mmol/L — ABNORMAL HIGH (ref 96–106)
Creatinine, Ser: 1.4 mg/dL — ABNORMAL HIGH (ref 0.76–1.27)
Glucose: 75 mg/dL (ref 70–99)
Potassium: 4.4 mmol/L (ref 3.5–5.2)
Sodium: 143 mmol/L (ref 134–144)
eGFR: 48 mL/min/{1.73_m2} — ABNORMAL LOW (ref >59)

## 2024-03-23 LAB — DRUG SCREEN 10 W/CONF, SERUM
Amphetamines, IA: NEGATIVE ng/mL
Barbiturates, IA: NEGATIVE ug/mL
Benzodiazepines, IA: NEGATIVE ng/mL
Cocaine & Metabolite, IA: NEGATIVE ng/mL
Methadone, IA: NEGATIVE ng/mL
Opiates, IA: NEGATIVE ng/mL
Oxycodones, IA: NEGATIVE ng/mL
Phencyclidine, IA: NEGATIVE ng/mL
Propoxyphene, IA: NEGATIVE ng/mL
THC(Marijuana) Metabolite, IA: NEGATIVE ng/mL

## 2024-03-30 ENCOUNTER — Telehealth: Payer: Self-pay

## 2024-03-30 NOTE — Telephone Encounter (Signed)
 Copied from CRM [id number]. Topic: Clinical - Home Health Verbal Orders >> Mar 30, 2024  9:49 AM Opal Bill wrote: Caller/Agency: Dovie Gell (PT)/[facility] is [phone] Callback Number: [phone] Any new concerns about the patient? Yes. Pt has been having a heart rate of 50 due to one of the meds he takes, metoprolol  tartrate (LOPRESSOR ) 25 MG tablet. The therapist says she has to notify the doctor as protocol. If this is normal for the pt, they are asking if the vital sign parameters can be changed? Please follow up.

## 2024-03-30 NOTE — Telephone Encounter (Signed)
 Patient and son in law aware and verbalizes understanding.

## 2024-04-02 ENCOUNTER — Telehealth: Payer: Self-pay

## 2024-04-02 NOTE — Telephone Encounter (Unsigned)
 Copied from CRM [id number]. Topic: Clinical - Home Health Verbal Orders >> Apr 02, 2024  3:52 PM Carrielelia G wrote: Caller/Agency: [facility] Ends with [facility] Number: [phone] Service Requested: Occupational Therapy Frequency: 1w6  Any new concerns about the patient? No

## 2024-04-05 NOTE — Telephone Encounter (Signed)
 Lmtcb

## 2024-04-08 ENCOUNTER — Other Ambulatory Visit (HOSPITAL_COMMUNITY): Payer: Medicare Other

## 2024-04-12 ENCOUNTER — Other Ambulatory Visit: Payer: Self-pay | Admitting: Family Medicine

## 2024-04-12 DIAGNOSIS — J310 Chronic rhinitis: Secondary | ICD-10-CM

## 2024-04-12 DIAGNOSIS — E782 Mixed hyperlipidemia: Secondary | ICD-10-CM

## 2024-04-13 NOTE — Telephone Encounter (Signed)
 Verbal given

## 2024-04-28 ENCOUNTER — Ambulatory Visit: Payer: Self-pay

## 2024-04-28 NOTE — Telephone Encounter (Signed)
 Chief Complaint: Cough with yellow phlegm Symptoms: None Frequency: Onset 1 week, worse past couple of days Pertinent Negatives: Patient denies other symptoms Disposition: [] ED /[] Urgent Care (no appt availability in office) / [x] Appointment(In office/virtual)/ []  [facility]/ [] Home Care/ [] Refused Recommended Disposition /[]  Mobile Bus/ []  Follow-up with PCP Additional Notes: Mitchell Chandler, PTA with [facility] called to report the patient has a cough and spitting up yellow colored sputum, VSS T 97.9, P 60, R 18, BP 120/85, O2 Sat RA 97%. Advised I will call and triage the patient. She says he's with his daughter Mitchell Chandler. Melissa called and she says the cough has been ongoing almost a week and since he coughed up the yellow-greenish/brown looking phlegm when the therapist was there about 1-2 hours ago, he hasn't coughed. She says he's been taking Tussin until it was gone, mucinex, and nyquil and reports the nyquil is helping. Advised an OV, she asked and he agreed, scheduled with PCP tomorrow.   Reason for Disposition  Cough has been present for > 3 weeks  Answer Assessment - Initial Assessment Questions 1. ONSET: "When did the cough begin?"      Almost a week, 2 days the worse 2. SEVERITY: "How bad is the cough today?"      Earlier when therapist, it was deep and wet 3. SPUTUM: "Describe the color of your sputum" (none, dry cough; clear, white, yellow, green)     Yellow-greenish 4. HEMOPTYSIS: "Are you coughing up any blood?" If so ask: "How much?" (flecks, streaks, tablespoons, etc.)     No 5. DIFFICULTY BREATHING: "Are you having difficulty breathing?" If Yes, ask: "How bad is it?" (e.g., mild, moderate, severe)    - MILD: No SOB at rest, mild SOB with walking, speaks normally in sentences, can lie down, no retractions, pulse < 100.    - MODERATE: SOB at rest, SOB with minimal exertion and prefers to sit, cannot lie down flat, speaks in phrases, mild retractions,  audible wheezing, pulse 100-120.    - SEVERE: Very SOB at rest, speaks in single words, struggling to breathe, sitting hunched forward, retractions, pulse > 120      No 6. FEVER: "Do you have a fever?" If Yes, ask: "What is your temperature, how was it measured, and when did it start?"     No 7. CARDIAC HISTORY: "Do you have any history of heart disease?" (e.g., heart attack, congestive heart failure)      Yes 8. LUNG HISTORY: "Do you have any history of lung disease?"  (e.g., pulmonary embolus, asthma, emphysema)     No 9. OTHER SYMPTOMS: "Do you have any other symptoms?" (e.g., runny nose, wheezing, chest pain)      No  Protocols used: Cough - Acute Productive-A-AH

## 2024-04-29 ENCOUNTER — Ambulatory Visit (INDEPENDENT_AMBULATORY_CARE_PROVIDER_SITE_OTHER): Admitting: Family Medicine

## 2024-04-29 ENCOUNTER — Other Ambulatory Visit

## 2024-04-29 ENCOUNTER — Encounter: Payer: Self-pay | Admitting: Family Medicine

## 2024-04-29 ENCOUNTER — Ambulatory Visit (INDEPENDENT_AMBULATORY_CARE_PROVIDER_SITE_OTHER)

## 2024-04-29 VITALS — BP 149/70 | HR 53 | Temp 97.8°F | Ht 68.0 in | Wt 182.4 lb

## 2024-04-29 DIAGNOSIS — R001 Bradycardia, unspecified: Secondary | ICD-10-CM

## 2024-04-29 DIAGNOSIS — R051 Acute cough: Secondary | ICD-10-CM | POA: Diagnosis not present

## 2024-04-29 MED ORDER — AZITHROMYCIN 250 MG PO TABS: ORAL_TABLET | ORAL | 0 refills | Status: AC

## 2024-04-29 NOTE — Progress Notes (Signed)
 Acute Office Visit  Subjective:     Patient ID: Mitchell Chandler, male    DOB: February 14, 1932, 89 y.o.   MRN: [id number]  Chief Complaint  Patient presents with   Cough    Cough This is a new problem. The current episode started in the past 7 days. The problem has been waxing and waning. The cough is Productive of sputum (yellow/green/brown). Associated symptoms include ear congestion, nasal congestion and postnasal drip. Pertinent negatives include no chills, ear pain, fever, headaches, hemoptysis, sore throat, shortness of breath or wheezing. He has tried OTC cough suppressant (mucines, nyquil, tussin) for the symptoms. The treatment provided mild relief. There is no history of asthma or COPD.   No edema or orthopnea.   Took full tablet of metoprolol  today. He hadn't been taking it lately due to BPs on the lower end. Denies dizziness, lightheadedness, chest pain, palpitations, visual disturbances, syncope, presyncope, diaphoresis, or dyspnea. HR is consistently in the 50s at home, sometimes upper 40s.  Nurse noted heart rate dipping into the 30s on pulse oximeter while obtaining his vitals today.   Review of Systems  Constitutional:  Negative for chills and fever.  HENT:  Positive for postnasal drip. Negative for ear pain and sore throat.   Respiratory:  Positive for cough. Negative for hemoptysis, shortness of breath and wheezing.   Neurological:  Negative for headaches.        Objective:    BP (!) 149/70   Pulse (!) 53   Temp 97.8 F (36.6 C) (Temporal)   Ht 5\' 8"  (1.727 m)   Wt 182 lb 6.4 oz (82.7 kg)   SpO2 92%   BMI 27.73 kg/m  SpO2 Readings from Last 3 Encounters:  04/29/24 92%  02/06/24 95%  01/29/24 98%   Pulse Readings from Last 3 Encounters:  04/29/24 (!) 53  03/17/24 (!) 53  02/06/24 (!) 55    Physical Exam Vitals and nursing note reviewed.  Constitutional:      General: He is not in acute distress.    Appearance: He is not ill-appearing,  toxic-appearing or diaphoretic.  HENT:     Right Ear: Tympanic membrane, ear canal and external ear normal.     Left Ear: Tympanic membrane, ear canal and external ear normal.     Nose: Congestion present.     Mouth/Throat:     Mouth: Mucous membranes are moist.     Pharynx: Uvula midline. Postnasal drip present. No pharyngeal swelling, oropharyngeal exudate, posterior oropharyngeal erythema or uvula swelling.     Tonsils: No tonsillar exudate or tonsillar abscesses. 1+ on the right. 1+ on the left.  Eyes:     General: No scleral icterus.       Right eye: No discharge.        Left eye: No discharge.     Conjunctiva/sclera: Conjunctivae normal.  Cardiovascular:     Rate and Rhythm: Regular rhythm. Bradycardia present.     Heart sounds: Normal heart sounds. No murmur heard. Pulmonary:     Effort: Pulmonary effort is normal. No respiratory distress.     Breath sounds: Normal breath sounds. No stridor. No wheezing, rhonchi or rales.  Abdominal:     General: Bowel sounds are normal. There is no distension.     Palpations: Abdomen is soft.     Tenderness: There is no abdominal tenderness. There is no guarding or rebound.  Musculoskeletal:     Cervical back: Neck supple.     Right lower  leg: No edema.     Left lower leg: No edema.  Lymphadenopathy:     Cervical: No cervical adenopathy.  Skin:    General: Skin is warm and dry.  Neurological:     Mental Status: He is alert and oriented to person, place, and time. Mental status is at baseline.     Gait: Gait abnormal (arrives in wheelchair).  Psychiatric:        Mood and Affect: Mood normal.        Behavior: Behavior normal.     No results found for any visits on 04/29/24.      Assessment & Plan:   Mitchell Chandler was seen today for cough.  Diagnoses and all orders for this visit:  Bradycardia EKG with bradycardia, RBBB, 1st degree HB and ventricular ectopy. Ventricular ectopy is new compared to previous EKG. Hold metoprolol . Denies  symptoms. Zio discussed and placed today. Discussed referral to EP if needed pending zio report. Go to ER for any symptoms. Reviewed last cardiology OV note, echo report, chest xray report from 08/02/20. -     EKG 12-Lead -     LONG TERM MONITOR (3-14 DAYS); Future  Acute cough CXR today without any signs of PNA or pulmonary edema. Radiology report is pending. Zpak discussed and ordered as below. Continue mucinex prn. Return to office for new or worsening symptoms, or if symptoms persist.  -     DG Chest 2 View -     azithromycin (ZITHROMAX) 250 MG tablet; Take 2 tablets on day 1, then 1 tablet daily on days 2 through 5  Return to office for new or worsening symptoms, or if symptoms persist.   Total time spent caring for the patient today was 43 minutes. This includes time spent before the visit reviewing the chart, time spent during the visit, and time spent after the visit on documentation.   Albertha Huger, FNP

## 2024-04-30 ENCOUNTER — Telehealth: Payer: Self-pay

## 2024-04-30 NOTE — Telephone Encounter (Signed)
 Copied from CRM [id number]. Topic: General - Other >> Apr 30, 2024  2:51 PM Felizardo Hotter wrote: Reason for CRM: Received call from [facility] per  Lita Rieger ph:[phone] for certificate and plan of care. It was first faxed on 04/01/2024. Please call to confirm.

## 2024-05-06 ENCOUNTER — Ambulatory Visit

## 2024-05-06 DIAGNOSIS — G8929 Other chronic pain: Secondary | ICD-10-CM | POA: Diagnosis not present

## 2024-05-06 DIAGNOSIS — N1831 Chronic kidney disease, stage 3a: Secondary | ICD-10-CM

## 2024-05-06 DIAGNOSIS — I2510 Atherosclerotic heart disease of native coronary artery without angina pectoris: Secondary | ICD-10-CM

## 2024-05-06 DIAGNOSIS — E559 Vitamin D deficiency, unspecified: Secondary | ICD-10-CM

## 2024-05-06 DIAGNOSIS — N400 Enlarged prostate without lower urinary tract symptoms: Secondary | ICD-10-CM

## 2024-05-06 DIAGNOSIS — E870 Hyperosmolality and hypernatremia: Secondary | ICD-10-CM

## 2024-05-06 DIAGNOSIS — G629 Polyneuropathy, unspecified: Secondary | ICD-10-CM | POA: Diagnosis not present

## 2024-05-06 DIAGNOSIS — M542 Cervicalgia: Secondary | ICD-10-CM | POA: Diagnosis not present

## 2024-05-06 DIAGNOSIS — I129 Hypertensive chronic kidney disease with stage 1 through stage 4 chronic kidney disease, or unspecified chronic kidney disease: Secondary | ICD-10-CM

## 2024-05-06 DIAGNOSIS — R54 Age-related physical debility: Secondary | ICD-10-CM

## 2024-05-06 DIAGNOSIS — M5430 Sciatica, unspecified side: Secondary | ICD-10-CM

## 2024-05-06 DIAGNOSIS — M1990 Unspecified osteoarthritis, unspecified site: Secondary | ICD-10-CM

## 2024-05-10 ENCOUNTER — Other Ambulatory Visit: Payer: Self-pay | Admitting: Family Medicine

## 2024-05-10 DIAGNOSIS — N401 Enlarged prostate with lower urinary tract symptoms: Secondary | ICD-10-CM

## 2024-05-18 ENCOUNTER — Telehealth: Payer: Self-pay | Admitting: *Deleted

## 2024-05-18 NOTE — Telephone Encounter (Signed)
 TC back to Mitchell Chandler at [phone], this was an FYI call that they were discharging him from [facility] services.   Copied from CRM [id number]. Topic: General - Other >> May 18, 2024  3:36 PM Mitchell Chandler wrote: Reason for CRM: Received call from [facility] per Mitchell Chandler ph: [phone] requesting discharge.

## 2024-05-26 ENCOUNTER — Telehealth: Payer: Self-pay

## 2024-05-26 NOTE — Telephone Encounter (Signed)
 Copied from CRM [id number]. Topic: Clinical - Lab/Test Results >> May 26, 2024  3:18 PM Tiffany S wrote: Reason for CRM: Abnormal EKG results  Please follow up for results  [id number] Ref [id number]

## 2024-05-26 NOTE — Telephone Encounter (Signed)
 Zio results as below:   Patient had a min HR of 21 bpm, max HR of 114 bpm, and avg HR of 57 bpm. Predominant underlying rhythm was Sinus Rhythm. First Degree AV Block was present. Intermittent Bundle Branch Block was present. Possible Atrial Tachycardia with variable block was  present. 2 Pauses occurred, the longest lasting 3.7 secs (16 bpm). Some Pause(s) occurred due to Possible High Grade AV Block, Second Degree AV Block-Mobitz I, and Non-Conducted SVEs. 50 episode(s) of AV Block (3rd) occurred, lasting a total of 9 mins  3 secs. Second Degree AV Block-Mobitz I (Wenckebach) was present. Isolated SVEs were rare (<1.0%), and no SVE Couplets or SVE Triplets were present. Isolated VEs were occasional (1.2%, 13303), VE Couplets were rare (<1.0%, 17), and no VE Triplets were  present. Ventricular Bigeminy and Trigeminy were present. MD notification criteria for Complete Heart Block met - report posted prior to notification (AR).       Per Tiffany pt needs to follow up with Dr Lavona and advised if he develops SOB, chest pain, lightheadedness, general weakness, pre syncopal symptoms to call 911 or seek evaluation at ED. Pt's daughter is aware of results and recommendations and voiced understanding. She will call to schedule follow up with Dr Lavona.

## 2024-05-27 ENCOUNTER — Telehealth: Payer: Self-pay | Admitting: Cardiology

## 2024-05-27 DIAGNOSIS — I459 Conduction disorder, unspecified: Secondary | ICD-10-CM

## 2024-05-27 NOTE — Telephone Encounter (Signed)
 Patient's daughter is calling to have Dr. Lavona look over the patient's recent heart monitor results. Please advise.

## 2024-05-28 NOTE — Telephone Encounter (Signed)
 Spoke with patient's daughter Eleanor (OK per DPR). Shared Dr. Denver response:  There is intermittent high degree heart block. I have not seen since late last year. Looks like PCP ordered. It would be reasonable to make a referral to EP as a new consult if they can get him in next week.   EP referral ordered, scheduler to call to schedule appt. Melissa verbalized understanding.

## 2024-06-01 ENCOUNTER — Other Ambulatory Visit: Payer: Self-pay

## 2024-06-01 ENCOUNTER — Ambulatory Visit: Attending: Cardiology | Admitting: Cardiology

## 2024-06-01 ENCOUNTER — Encounter: Payer: Self-pay | Admitting: Cardiology

## 2024-06-01 VITALS — BP 146/68 | HR 77 | Resp 16 | Ht 68.0 in | Wt 182.4 lb

## 2024-06-01 DIAGNOSIS — Z951 Presence of aortocoronary bypass graft: Secondary | ICD-10-CM | POA: Insufficient documentation

## 2024-06-01 DIAGNOSIS — I25810 Atherosclerosis of coronary artery bypass graft(s) without angina pectoris: Secondary | ICD-10-CM

## 2024-06-01 DIAGNOSIS — I459 Conduction disorder, unspecified: Secondary | ICD-10-CM

## 2024-06-01 DIAGNOSIS — I350 Nonrheumatic aortic (valve) stenosis: Secondary | ICD-10-CM | POA: Diagnosis present

## 2024-06-01 DIAGNOSIS — I10 Essential (primary) hypertension: Secondary | ICD-10-CM | POA: Diagnosis not present

## 2024-06-01 DIAGNOSIS — R001 Bradycardia, unspecified: Secondary | ICD-10-CM | POA: Diagnosis present

## 2024-06-01 NOTE — H&P (View-Only) (Signed)
 Electrophysiology Office Note:   Date:  06/02/2024  ID:  Mitchell Chandler, DOB October 20, 1932, MRN [id number]  Primary Cardiologist: None Electrophysiologist: Fonda Kitty, MD      History of Present Illness:   ZALYN AMEND is a 89 y.o. male with h/o CAD s/p remote CABG, moderate AS, HLD, HTN who is being seen today for evaluation of his bradycardia.   Discussed the use of AI scribe software for clinical note transcription with the patient, who gave verbal consent to proceed.  History of Present Illness Mitchell Chandler is a 89 year old male with coronary artery disease who presents with fatigue and weakness. He has been experiencing significant fatigue and weakness for several months, describing it as feeling like he is 'running out of gas.' The weakness is severe enough that he requires a walker to prevent falls. He recently wore a heart monitor which demonstrated bradycardia. He mentions that his oldest brother had three pacemakers and has noticed irregularities in his own heart rhythm, as indicated by his home blood pressure monitor. No new symptoms beyond those related to his heart condition are reported.  He has a history of coronary artery disease and underwent six bypass surgeries in 1995, after which he made a 'miraculous recovery.' He also has moderate aortic valve stenosis, as noted in an echocardiogram performed a year ago. His last heart ultrasound was about a year ago. He is currently on aspirin and metoprolol  was recently discontinued.  Review of systems complete and found to be negative unless listed in HPI.   EP Information / Studies Reviewed:    EKG is ordered today. Personal review as below.  EKG Interpretation Date/Time:  Tuesday June 01 2024 15:49:41 EDT Ventricular Rate:  64 PR Interval:  284 QRS Duration:  166 QT Interval:  474 QTC Calculation: 489 R Axis:   50  Text Interpretation: Sinus rhythm with 1st degree A-V block Left bundle branch block When compared with ECG of  29-Jan-2024 15:27, No significant change Confirmed by Kitty Fonda [phone]) on 06/01/2024 4:05:56 PM   Echo 04/03/23:   1. Left ventricular ejection fraction, by estimation, is 65 to 70%. The  left ventricle has normal function. The left ventricle has no regional  wall motion abnormalities. There is moderate asymmetric left ventricular  hypertrophy of the septal segment.  Left ventricular diastolic parameters are consistent with Grade I  diastolic dysfunction (impaired relaxation). Elevated left atrial  pressure.   2. Right ventricular systolic function is normal. The right ventricular  size is normal.   3. The mitral valve is normal in structure. No evidence of mitral valve  regurgitation. No evidence of mitral stenosis.   4. The aortic valve is tricuspid. Aortic valve regurgitation is not  visualized. Moderate aortic valve stenosis. Aortic valve mean gradient  measures 25.0 mmHg. Aortic valve peak gradient measures 43.3 mmHg. Aortic  valve area, by VTI measures 1.11 cm   5. The inferior vena cava is normal in size with greater than 50%  respiratory variability, suggesting right atrial pressure of 3 mmHg.   Zio Monitor 5/29 - 6/12:     Physical Exam:   VS:  BP (!) 146/68 (BP Location: Left Arm, Patient Position: Sitting, Cuff Size: Normal)   Pulse 77   Resp 16   Ht 5' 8 (1.727 m)   Wt 182 lb 6.4 oz (82.7 kg)   SpO2 95%   BMI 27.73 kg/m    Wt Readings from Last 3 Encounters:  06/01/24 182 lb  6.4 oz (82.7 kg)  04/29/24 182 lb 6.4 oz (82.7 kg)  03/17/24 184 lb 6.4 oz (83.6 kg)     GEN: Well nourished, well developed in no acute distress NECK: No JVD CARDIAC: Bradycardic, regular, 3/6 SEM RESPIRATORY:  Clear to auscultation without rales, wheezing or rhonchi  ABDOMEN: Soft, non-distended EXTREMITIES:  No edema; No deformity   ASSESSMENT AND PLAN:    #. Intermittent CHB: Seen on Zio monitor. Baseline conduction disease with LBBB. #. Symptomatic bradycardia - Patient  meets criteria for permanent pacemaker implant for symptomatic bradycardia and intermittent CHB. Explained risks, benefits, and alternatives to pacemaker implantation, including but not limited to bleeding, infection, damage to heart or lungs, heart attack, stroke, or death.  Pt verbalized understanding and wants to proceed.  #. Moderate AS: Seen on echo 04/2023. Well compensated on exam. Suspicion that his symptoms are from bradycardia. If symptoms persist after pacemaker implant then consider repeating echocardiogram to reassess AS.   #. CAD s/p remote CABG: No chest pain.  - Continue aspirin and simvastatin .  #. Hypertension -Just above goal for his age today.  Recommend checking blood pressures 1-2 times per week at home and recording the values.  Recommend bringing these recordings to the primary care physician.   Follow up with Dr. Kennyth 3 months after pacemaker implant.   Signed, Fonda Kennyth, MD

## 2024-06-01 NOTE — Patient Instructions (Signed)
 Medication Instructions:  Your physician recommends that you continue on your current medications as directed. Please refer to the Current Medication list given to you today.  *If you need a refill on your cardiac medications before your next appointment, please call your pharmacy*  Lab Work: TODAY: BMET and CBC  Testing/Procedures: Pacemaker Implant Your physician has recommended that you have a pacemaker inserted. A pacemaker is a small device that is placed under the skin of your chest or abdomen to help control abnormal heart rhythms. This device uses electrical pulses to prompt the heart to beat at a normal rate. Pacemakers are used to treat heart rhythms that are too slow. Wire (leads) are attached to the pacemaker that goes into the chambers of you heart. This is done in the hospital and usually requires and overnight stay. Please see the instruction sheet given to you today for more information.   Follow-Up: At [facility], you and your health needs are our priority.  As part of our continuing mission to provide you with exceptional heart care, our providers are all part of one team.  This team includes your primary Cardiologist (physician) and Advanced Practice Providers or APPs (Physician Assistants and Nurse Practitioners) who all work together to provide you with the care you need, when you need it.  Your next appointment:   We will call you to schedule your follow up appointments

## 2024-06-01 NOTE — Progress Notes (Signed)
 Electrophysiology Office Note:   Date:  06/02/2024  ID:  Mitchell Chandler, DOB October 20, 1932, MRN [id number]  Primary Cardiologist: None Electrophysiologist: Fonda Kitty, MD      History of Present Illness:   Mitchell Chandler is a 89 y.o. male with h/o CAD s/p remote CABG, moderate AS, HLD, HTN who is being seen today for evaluation of his bradycardia.   Discussed the use of AI scribe software for clinical note transcription with the patient, who gave verbal consent to proceed.  History of Present Illness Mitchell Chandler is a 89 year old male with coronary artery disease who presents with fatigue and weakness. He has been experiencing significant fatigue and weakness for several months, describing it as feeling like he is 'running out of gas.' The weakness is severe enough that he requires a walker to prevent falls. He recently wore a heart monitor which demonstrated bradycardia. He mentions that his oldest brother had three pacemakers and has noticed irregularities in his own heart rhythm, as indicated by his home blood pressure monitor. No new symptoms beyond those related to his heart condition are reported.  He has a history of coronary artery disease and underwent six bypass surgeries in 1995, after which he made a 'miraculous recovery.' He also has moderate aortic valve stenosis, as noted in an echocardiogram performed a year ago. His last heart ultrasound was about a year ago. He is currently on aspirin and metoprolol  was recently discontinued.  Review of systems complete and found to be negative unless listed in HPI.   EP Information / Studies Reviewed:    EKG is ordered today. Personal review as below.  EKG Interpretation Date/Time:  Tuesday June 01 2024 15:49:41 EDT Ventricular Rate:  64 PR Interval:  284 QRS Duration:  166 QT Interval:  474 QTC Calculation: 489 R Axis:   50  Text Interpretation: Sinus rhythm with 1st degree A-V block Left bundle branch block When compared with ECG of  29-Jan-2024 15:27, No significant change Confirmed by Kitty Fonda [phone]) on 06/01/2024 4:05:56 PM   Echo 04/03/23:   1. Left ventricular ejection fraction, by estimation, is 65 to 70%. The  left ventricle has normal function. The left ventricle has no regional  wall motion abnormalities. There is moderate asymmetric left ventricular  hypertrophy of the septal segment.  Left ventricular diastolic parameters are consistent with Grade I  diastolic dysfunction (impaired relaxation). Elevated left atrial  pressure.   2. Right ventricular systolic function is normal. The right ventricular  size is normal.   3. The mitral valve is normal in structure. No evidence of mitral valve  regurgitation. No evidence of mitral stenosis.   4. The aortic valve is tricuspid. Aortic valve regurgitation is not  visualized. Moderate aortic valve stenosis. Aortic valve mean gradient  measures 25.0 mmHg. Aortic valve peak gradient measures 43.3 mmHg. Aortic  valve area, by VTI measures 1.11 cm   5. The inferior vena cava is normal in size with greater than 50%  respiratory variability, suggesting right atrial pressure of 3 mmHg.   Zio Monitor 5/29 - 6/12:     Physical Exam:   VS:  BP (!) 146/68 (BP Location: Left Arm, Patient Position: Sitting, Cuff Size: Normal)   Pulse 77   Resp 16   Ht 5' 8 (1.727 m)   Wt 182 lb 6.4 oz (82.7 kg)   SpO2 95%   BMI 27.73 kg/m    Wt Readings from Last 3 Encounters:  06/01/24 182 lb  6.4 oz (82.7 kg)  04/29/24 182 lb 6.4 oz (82.7 kg)  03/17/24 184 lb 6.4 oz (83.6 kg)     GEN: Well nourished, well developed in no acute distress NECK: No JVD CARDIAC: Bradycardic, regular, 3/6 SEM RESPIRATORY:  Clear to auscultation without rales, wheezing or rhonchi  ABDOMEN: Soft, non-distended EXTREMITIES:  No edema; No deformity   ASSESSMENT AND PLAN:    #. Intermittent CHB: Seen on Zio monitor. Baseline conduction disease with LBBB. #. Symptomatic bradycardia - Patient  meets criteria for permanent pacemaker implant for symptomatic bradycardia and intermittent CHB. Explained risks, benefits, and alternatives to pacemaker implantation, including but not limited to bleeding, infection, damage to heart or lungs, heart attack, stroke, or death.  Pt verbalized understanding and wants to proceed.  #. Moderate AS: Seen on echo 04/2023. Well compensated on exam. Suspicion that his symptoms are from bradycardia. If symptoms persist after pacemaker implant then consider repeating echocardiogram to reassess AS.   #. CAD s/p remote CABG: No chest pain.  - Continue aspirin and simvastatin .  #. Hypertension -Just above goal for his age today.  Recommend checking blood pressures 1-2 times per week at home and recording the values.  Recommend bringing these recordings to the primary care physician.   Follow up with Dr. Kennyth 3 months after pacemaker implant.   Signed, Fonda Kennyth, MD

## 2024-06-02 ENCOUNTER — Encounter: Payer: Self-pay | Admitting: Emergency Medicine

## 2024-06-02 ENCOUNTER — Ambulatory Visit: Payer: Self-pay | Admitting: Cardiology

## 2024-06-02 LAB — CBC
Hematocrit: 46.5 % (ref 37.5–51.0)
Hemoglobin: 15.4 g/dL (ref 13.0–17.7)
MCH: 32.2 pg (ref 26.6–33.0)
MCHC: 33.1 g/dL (ref 31.5–35.7)
MCV: 97 fL (ref 79–97)
Platelets: 180 x10E3/uL (ref 150–450)
RBC: 4.78 x10E6/uL (ref 4.14–5.80)
RDW: 13.7 % (ref 11.6–15.4)
WBC: 6.4 10*3/uL (ref 3.4–10.8)

## 2024-06-02 LAB — BASIC METABOLIC PANEL WITH GFR
BUN/Creatinine Ratio: 16 (ref 10–24)
BUN: 20 mg/dL (ref 10–36)
CO2: 20 mmol/L (ref 20–29)
Calcium: 9.6 mg/dL (ref 8.6–10.2)
Chloride: 103 mmol/L (ref 96–106)
Creatinine, Ser: 1.28 mg/dL — ABNORMAL HIGH (ref 0.76–1.27)
Glucose: 84 mg/dL (ref 70–99)
Potassium: 4.5 mmol/L (ref 3.5–5.2)
Sodium: 140 mmol/L (ref 134–144)
eGFR: 53 mL/min/1.73 — ABNORMAL LOW (ref >59)

## 2024-06-07 NOTE — Pre-Procedure Instructions (Signed)
 Instructed patient on the following items: Arrival time 1230 Nothing to eat or drink after midnight No meds AM of procedure Responsible person to drive you home and stay with you for 24 hrs Wash with special soap night before and morning of procedure

## 2024-06-08 ENCOUNTER — Other Ambulatory Visit: Payer: Self-pay

## 2024-06-08 ENCOUNTER — Ambulatory Visit (HOSPITAL_COMMUNITY)

## 2024-06-08 ENCOUNTER — Ambulatory Visit (HOSPITAL_COMMUNITY)
Admission: RE | Admit: 2024-06-08 | Discharge: 2024-06-08 | Disposition: A | Discharge status: Home / Self Care | Source: Ambulatory Visit | Attending: Cardiology | Admitting: Cardiology

## 2024-06-08 ENCOUNTER — Encounter (HOSPITAL_COMMUNITY)
Admission: RE | Disposition: A | Discharge status: Home / Self Care | Payer: Self-pay | Source: Ambulatory Visit | Attending: Cardiology

## 2024-06-08 DIAGNOSIS — Z7982 Long term (current) use of aspirin: Secondary | ICD-10-CM | POA: Insufficient documentation

## 2024-06-08 DIAGNOSIS — Z951 Presence of aortocoronary bypass graft: Secondary | ICD-10-CM | POA: Insufficient documentation

## 2024-06-08 DIAGNOSIS — I2510 Atherosclerotic heart disease of native coronary artery without angina pectoris: Secondary | ICD-10-CM | POA: Insufficient documentation

## 2024-06-08 DIAGNOSIS — I442 Atrioventricular block, complete: Secondary | ICD-10-CM | POA: Diagnosis present

## 2024-06-08 DIAGNOSIS — I350 Nonrheumatic aortic (valve) stenosis: Secondary | ICD-10-CM | POA: Diagnosis not present

## 2024-06-08 DIAGNOSIS — I10 Essential (primary) hypertension: Secondary | ICD-10-CM | POA: Insufficient documentation

## 2024-06-08 SURGERY — PACEMAKER IMPLANT

## 2024-06-08 MED ORDER — MIDAZOLAM HCL 5 MG/5ML IJ SOLN
INTRAMUSCULAR | Status: DC | PRN
  Administered 2024-06-08 (×2): .5 mg via INTRAVENOUS

## 2024-06-08 MED ORDER — MIDAZOLAM HCL 2 MG/2ML IJ SOLN
INTRAMUSCULAR | Status: AC
  Filled 2024-06-08: qty 2

## 2024-06-08 MED ORDER — FENTANYL CITRATE (PF) 100 MCG/2ML IJ SOLN
INTRAMUSCULAR | Status: DC | PRN
  Administered 2024-06-08 (×2): 25 ug via INTRAVENOUS

## 2024-06-08 MED ORDER — FENTANYL CITRATE (PF) 100 MCG/2ML IJ SOLN
INTRAMUSCULAR | Status: AC
  Filled 2024-06-08: qty 2

## 2024-06-08 MED ORDER — CEFAZOLIN SODIUM-DEXTROSE 2-4 GM/100ML-% IV SOLN
2.0000 g | INTRAVENOUS | Status: AC
  Administered 2024-06-08: 2 g via INTRAVENOUS

## 2024-06-08 MED ORDER — SODIUM CHLORIDE 0.9 % IV SOLN
INTRAVENOUS | Status: AC
  Administered 2024-06-08: 80 mg
  Filled 2024-06-08: qty 2

## 2024-06-08 MED ORDER — CEFAZOLIN SODIUM-DEXTROSE 2-4 GM/100ML-% IV SOLN
INTRAVENOUS | Status: AC
  Filled 2024-06-08: qty 100

## 2024-06-08 MED ORDER — POVIDONE-IODINE 10 % EX SWAB: 2.0000 | Freq: Once | CUTANEOUS | Status: DC

## 2024-06-08 MED ORDER — CHLORHEXIDINE GLUCONATE 4 % EX SOLN
4.0000 | Freq: Once | CUTANEOUS | Status: DC
  Filled 2024-06-08: qty 60

## 2024-06-08 MED ORDER — ACETAMINOPHEN 325 MG PO TABS: 325.0000 mg | ORAL_TABLET | ORAL | Status: DC | PRN

## 2024-06-08 MED ORDER — LIDOCAINE HCL (PF) 1 % IJ SOLN
INTRAMUSCULAR | Status: DC | PRN
  Administered 2024-06-08: 45 mL

## 2024-06-08 MED ORDER — ONDANSETRON HCL 4 MG/2ML IJ SOLN: 4.0000 mg | Freq: Four times a day (QID) | INTRAMUSCULAR | Status: DC | PRN

## 2024-06-08 MED ORDER — SODIUM CHLORIDE 0.9 % IV SOLN: INTRAVENOUS | Status: DC

## 2024-06-08 MED ORDER — HEPARIN (PORCINE) IN NACL 1000-0.9 UT/500ML-% IV SOLN
INTRAVENOUS | Status: DC | PRN
  Administered 2024-06-08: 500 mL

## 2024-06-08 MED ORDER — SODIUM CHLORIDE 0.9 % IV SOLN
80.0000 mg | INTRAVENOUS | Status: AC
  Filled 2024-06-08: qty 2

## 2024-06-08 MED ORDER — LIDOCAINE HCL 1 % IJ SOLN
INTRAMUSCULAR | Status: AC
Start: 2024-06-08 — End: 2024-06-08
  Filled 2024-06-08: qty 60

## 2024-06-08 SURGICAL SUPPLY — 13 items
CABLE SURGICAL S-101-97-12 (CABLE) ×2 IMPLANT
CATH CPS LOCATOR 3D MED (CATHETERS) IMPLANT
LEAD ULTIPACE 52 LPA1231/52 (Lead) IMPLANT
LEAD ULTIPACE 65 LPA1231/65 (Lead) IMPLANT
PACEMAKER ASSURITY DR-RF (Pacemaker) IMPLANT
PAD DEFIB RADIO PHYSIO CONN (PAD) ×2 IMPLANT
SHEATH 7FR PRELUDE SNAP 13 (SHEATH) IMPLANT
SHEATH 9FR PRELUDE SNAP 13 (SHEATH) IMPLANT
SHEATH PROBE COVER 6X72 (BAG) IMPLANT
SLITTER AGILIS HISPRO (INSTRUMENTS) IMPLANT
TOOL HELIX LOCKING (MISCELLANEOUS) IMPLANT
TRAY PACEMAKER INSERTION (PACKS) ×2 IMPLANT
WIRE HI TORQ VERSACORE-J 145CM (WIRE) IMPLANT

## 2024-06-08 NOTE — Discharge Instructions (Addendum)
 After Your Cardiac Device  You have a [employer] Cardiac Device  ACTIVITY Do not lift your arm above shoulder height for 1 week after your procedure. After 7 days, you may progress as below.  You should remove your sling 24 hours after your procedure, unless otherwise instructed by your provider.     Tuesday June 15, 2024  Wednesday June 16, 2024 Thursday June 17, 2024 Friday June 18, 2024   Do not lift, push, pull, or carry anything over 10 pounds with the affected arm until 6 weeks (Tuesday July 20, 2024 ) after your procedure.   You may drive AFTER your wound check, unless you have been told otherwise by your provider.   Ask your healthcare provider when you can go back to work   INCISION/Dressing If you are on a blood thinner such as Coumadin, Xarelto, Eliquis, Plavix, or Pradaxa please confirm with your provider when this should be resumed.   If large square, outer bandage is left in place, this can be removed after 24 hours from your procedure. Do not remove steri-strips or glue as below.   If a PRESSURE DRESSING (a bulky dressing that usually goes up over your shoulder) was applied or left in place, please follow instructions given by your provider on when to return to have this removed.   Monitor your cardiac device site for redness, swelling, and drainage. Call the device clinic at [phone] if you experience these symptoms or fever/chills.  If your incision is sealed with Steri-strips or staples, you may shower 7 days after your procedure or when told by your provider. Do not remove the steri-strips or let the shower hit directly on your site. You may wash around your site with soap and water .    If you were discharged in a sling, please do not wear this during the day more than 48 hours after your surgery unless otherwise instructed. This may increase the risk of stiffness and soreness in your shoulder.   Avoid lotions, ointments, or perfumes over your incision  until it is well-healed.  You may use a hot tub or a pool AFTER your wound check appointment if the incision is completely closed.   DEVICE MANAGEMENT You should receive your ID card for your new device in 4-8 weeks. Keep this card with you at all times once received. Consider wearing a medical alert bracelet or necklace.  Please follow manufacture instructions for keeping your device charged carefully. This should be performed every 2 weeks at the very least.   Your cardiac device may be MRI compatible. This will be discussed at your next office visit/wound check.  You should avoid contact with strong electric or magnetic fields.   Do not use amateur (ham) radio equipment or electric (arc) welding torches. MP3 player headphones with magnets should not be used. Some devices are safe to use if held at least 12 inches (30 cm) from your cardiac device. These include power tools, lawn mowers, and speakers. If you are unsure if something is safe to use, ask your health care provider.  When using your cell phone, hold it to the ear that is on the opposite side from the cardiac device. Do not leave your cell phone in a pocket over the cardiac device.  You may safely use electric blankets, heating pads, computers, and microwave ovens.  Call the office right away if: You have chest pain. You feel more short of breath than you have felt before. You feel more light-headed  than you have felt before. Your incision starts to open up.  This information is not intended to replace advice given to you by your health care provider. Make sure you discuss any questions you have with your health care provider.

## 2024-06-08 NOTE — Interval H&P Note (Signed)
 History and Physical Interval Note:  06/08/2024 2:03 PM  Mitchell Chandler  has presented today for surgery, with the diagnosis of bradycardia and AV block.  The various methods of treatment have been discussed with the patient and family. After consideration of risks, benefits and other options for treatment, the patient has consented to  Procedure(s): PACEMAKER IMPLANT (N/A) as a surgical intervention.  The patient's history has been reviewed, patient examined, no change in status, stable for surgery.  I have reviewed the patient's chart and labs.  Questions were answered to the patient's satisfaction.     Fonda Kitty

## 2024-06-09 ENCOUNTER — Encounter (HOSPITAL_COMMUNITY): Payer: Self-pay | Admitting: Cardiology

## 2024-06-11 ENCOUNTER — Telehealth: Payer: Self-pay

## 2024-06-11 MED FILL — Midazolam HCl Inj 2 MG/2ML (Base Equivalent): INTRAMUSCULAR | Qty: 1 | Status: AC

## 2024-06-11 NOTE — Telephone Encounter (Signed)
 Follow-up after same day discharge: Implant date: 06/08/2024 MD: Sidra Kitty Device: Abbott Pacemaker Location: Left Chest   Wound check visit: 06/22/2024 90 day MD follow-up: 09/21/2024  Remote Transmission received:06/10/2024  Dressing/sling removed: n/a  Confirm OAC restart on: n/a  Please continue to monitor your cardiac device site for redness, swelling, and drainage. Call the device clinic at [phone] if you experience these symptoms, fever/chills, or have questions about your device.   Remote monitoring is used to monitor your cardiac device from home. This monitoring is scheduled every 91 days by our office. It allows us  to keep an eye on the functioning of your device to ensure it is working properly.  LMOVM for pt to give us  a call back.

## 2024-06-14 ENCOUNTER — Other Ambulatory Visit: Payer: Self-pay | Admitting: Family Medicine

## 2024-06-14 DIAGNOSIS — J309 Allergic rhinitis, unspecified: Secondary | ICD-10-CM

## 2024-06-16 ENCOUNTER — Ambulatory Visit (INDEPENDENT_AMBULATORY_CARE_PROVIDER_SITE_OTHER): Admitting: Family Medicine

## 2024-06-16 VITALS — BP 108/54 | HR 64 | Temp 98.0°F | Ht 68.0 in | Wt 178.4 lb

## 2024-06-16 DIAGNOSIS — Z79899 Other long term (current) drug therapy: Secondary | ICD-10-CM

## 2024-06-16 DIAGNOSIS — I459 Conduction disorder, unspecified: Secondary | ICD-10-CM

## 2024-06-16 DIAGNOSIS — Z95811 Presence of heart assist device: Secondary | ICD-10-CM | POA: Diagnosis not present

## 2024-06-16 DIAGNOSIS — I10 Essential (primary) hypertension: Secondary | ICD-10-CM

## 2024-06-16 DIAGNOSIS — I25810 Atherosclerosis of coronary artery bypass graft(s) without angina pectoris: Secondary | ICD-10-CM | POA: Diagnosis not present

## 2024-06-16 DIAGNOSIS — R051 Acute cough: Secondary | ICD-10-CM

## 2024-06-16 DIAGNOSIS — I350 Nonrheumatic aortic (valve) stenosis: Secondary | ICD-10-CM

## 2024-06-16 DIAGNOSIS — E785 Hyperlipidemia, unspecified: Secondary | ICD-10-CM

## 2024-06-16 DIAGNOSIS — R011 Cardiac murmur, unspecified: Secondary | ICD-10-CM

## 2024-06-16 DIAGNOSIS — G629 Polyneuropathy, unspecified: Secondary | ICD-10-CM

## 2024-06-16 DIAGNOSIS — N1831 Chronic kidney disease, stage 3a: Secondary | ICD-10-CM

## 2024-06-16 DIAGNOSIS — Z951 Presence of aortocoronary bypass graft: Secondary | ICD-10-CM

## 2024-06-16 MED ORDER — AZITHROMYCIN 250 MG PO TABS
ORAL_TABLET | ORAL | 0 refills | Status: DC
Start: 2024-06-16 — End: 2024-09-29

## 2024-06-16 MED ORDER — PREGABALIN 25 MG PO CAPS: 25.0000 mg | ORAL_CAPSULE | Freq: Three times a day (TID) | ORAL | 2 refills | Status: DC

## 2024-06-16 NOTE — Progress Notes (Signed)
 Established Patient Office Visit  Subjective   Patient ID: Mitchell Chandler, male    DOB: 10-19-1932  Age: 89 y.o. MRN: [id number]  Chief Complaint  Patient presents with   Medical Management of Chronic Issues    HPI HTN Current Medications -lasix  daily Checking BP at home ranging- BP has been lower.BP this morning was 80-90/40-50s. Typically 110s/50-60s.  Pertinent ROS:  Visual Disturbances - No Chest pain - No Dyspnea - No Palpitations - No LE edema - No  Feeling better since pacemaker. Not having heaviness anymore and has increased strength. Pulse range is 60-120 on pacemaker.   2. HLD On simvastatin . Denies side effects. Last LDL at 70- over a year ago now. Not fasting today.   3. Cough Increased for a few weeks. Sometimes productive. Increased head congestion as well. Taking astelin , flonase , xyzal  without relief. Last time this happened, zpak cleared cough up.   4. Neck pain Well controlled with lyrica . Denies side effects of lyrica .   Past Medical History:  Diagnosis Date   Benign prostatic hyperplasia 12/25/2016   Heart block AV complete [facility]) oct 1995   [facility] 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 Yearly surviellance.    Renal stone 01/26/2020   Vitamin D  deficiency 08/13/2020      ROS Negative unless specially indicated above in HPI.   Objective:     BP (!) 108/54   Pulse 64   Temp 98 F (36.7 C) (Temporal)   Ht 5' 8 (1.727 m)   Wt 178 lb 6.4 oz (80.9 kg)   SpO2 97%   BMI 27.13 kg/m  BP Readings from Last 3 Encounters:  06/16/24 (!) 108/54  06/08/24 (!) 150/66  06/01/24 (!) 146/68      Physical Exam Vitals and nursing note reviewed.  Constitutional:      General: He is not in acute distress.    Appearance: He is not ill-appearing, toxic-appearing or diaphoretic.  HENT:     Head: Normocephalic and  atraumatic.     Right Ear: Tympanic membrane, ear canal and external ear normal.     Left Ear: Tympanic membrane, ear canal and external ear normal.     Nose: Congestion present.     Right Sinus: No maxillary sinus tenderness or frontal sinus tenderness.     Left Sinus: No maxillary sinus tenderness or frontal sinus tenderness.     Mouth/Throat:     Mouth: Mucous membranes are moist.     Pharynx: Oropharynx is clear.  Eyes:     General:        Right eye: No discharge.        Left eye: No discharge.  Neck:     Thyroid: No thyroid mass, thyromegaly or thyroid tenderness.  Cardiovascular:     Rate and Rhythm: Normal rate and regular rhythm.     Heart sounds: Murmur heard.     Systolic murmur is present with a grade of 3/6.     No diastolic murmur is present.     No friction rub. No gallop. No S3 or S4 sounds.  Pulmonary:     Effort: Pulmonary effort is normal. No respiratory distress.     Breath sounds: Normal breath sounds.  Musculoskeletal:     Cervical back: Neck supple. Normal range of motion.  Left hip: No deformity, tenderness or bony tenderness.     Right lower leg: No edema.     Left lower leg: No edema.  Skin:    General: Skin is warm and dry.  Neurological:     Mental Status: He is alert and oriented to person, place, and time. Mental status is at baseline.  Psychiatric:        Mood and Affect: Mood normal.        Behavior: Behavior normal.      No results found for any visits on 06/16/24.    The ASCVD Risk score (Arnett DK, et al., 2019) failed to calculate for the following reasons:   The 2019 ASCVD risk score is only valid for ages 61 to 105    Assessment & Plan:   Nabil was seen today for medical management of chronic issues.  Diagnoses and all orders for this visit:  Primary hypertension BP soft. Take lasix  prn for weight gain, edema rather than daily. Continue to monitor BP at home and notify for high or low readings.   Heart block Presence of  heart assist device [facility]) Feeling better s/p pacemaker.   Coronary artery disease involving coronary bypass graft of native heart, unspecified whether angina present S/P CABG (coronary artery bypass graft) Moderate aortic stenosis Heart murmur Continue follow up with cardiology. On statin and aspirin.   Dyslipidemia On statin. Last LDL 64.  Stage 3a chronic kidney disease (HCC) Avoid NSAIDs. No ACE/ARB due to low BPs.   Neuropathy Well controlled on current regimen. PDMP reviewed, no red flags. CSA and UDS updated today.  -     pregabalin  (LYRICA ) 25 MG capsule; Take 1 capsule (25 mg total) by mouth 3 (three) times daily.  Controlled substance agreement signed  Acute cough Continue xyzal , flonase . Lungs clear on exam. Zpak as below. Return to office for new or worsening symptoms, or if symptoms persist.  -     azithromycin  (ZITHROMAX  Z-PAK) 250 MG tablet; As directed   Return in about 3 months (around 09/16/2024) for chronic follow up.   The patient indicates understanding of these issues and agrees with the plan.  Annabella CHRISTELLA Search, FNP

## 2024-06-17 NOTE — Telephone Encounter (Signed)
 Pt daughter states overall he is doing very well. He has started his medicine and dressing is off. She had no questions for us .

## 2024-06-22 ENCOUNTER — Ambulatory Visit: Attending: Cardiology

## 2024-06-22 ENCOUNTER — Encounter: Payer: Self-pay | Admitting: Family Medicine

## 2024-06-22 DIAGNOSIS — I459 Conduction disorder, unspecified: Secondary | ICD-10-CM | POA: Diagnosis not present

## 2024-06-22 LAB — CUP PACEART INCLINIC DEVICE CHECK
Battery Remaining Longevity: 120 mo
Battery Voltage: 3.07 V
Brady Statistic RA Percent Paced: 75 %
Brady Statistic RV Percent Paced: 99.91 %
Date Time Interrogation Session: 20250722092051
Implantable Lead Connection Status: 753985
Implantable Lead Connection Status: 753985
Implantable Lead Implant Date: 20250708
Implantable Lead Implant Date: 20250708
Implantable Lead Location: 753859
Implantable Lead Location: 753860
Implantable Pulse Generator Implant Date: 20250708
Lead Channel Impedance Value: 387.5 Ohm
Lead Channel Impedance Value: 487.5 Ohm
Lead Channel Pacing Threshold Amplitude: 0.5 V
Lead Channel Pacing Threshold Amplitude: 0.5 V
Lead Channel Pacing Threshold Pulse Width: 0.5 ms
Lead Channel Pacing Threshold Pulse Width: 0.5 ms
Lead Channel Sensing Intrinsic Amplitude: 12 mV
Lead Channel Sensing Intrinsic Amplitude: 2.5 mV
Lead Channel Setting Pacing Amplitude: 0.75 V
Lead Channel Setting Pacing Amplitude: 1.5 V
Lead Channel Setting Pacing Pulse Width: 0.5 ms
Lead Channel Setting Sensing Sensitivity: 2 mV
Pulse Gen Model: 2272
Pulse Gen Serial Number: 8284684

## 2024-06-22 NOTE — Progress Notes (Signed)
 Normal dual chamber pacemaker wound check. Presenting rhythm: AS/VP 65. Wound well healed. Routine testing performed. Thresholds, sensing, and impedances consistent with implant measurements. AT/AF burden <1%. No AF noted. FF oversensing noted. Reviewed arm restrictions to continue for 6 weeks total post op.  Pt enrolled in remote follow-up.

## 2024-06-22 NOTE — Patient Instructions (Signed)

## 2024-06-23 ENCOUNTER — Ambulatory Visit: Payer: Self-pay | Admitting: Cardiology

## 2024-07-10 ENCOUNTER — Other Ambulatory Visit: Payer: Self-pay | Admitting: Family Medicine

## 2024-07-10 DIAGNOSIS — E782 Mixed hyperlipidemia: Secondary | ICD-10-CM

## 2024-07-21 ENCOUNTER — Ambulatory Visit (INDEPENDENT_AMBULATORY_CARE_PROVIDER_SITE_OTHER)

## 2024-07-21 DIAGNOSIS — I459 Conduction disorder, unspecified: Secondary | ICD-10-CM | POA: Diagnosis not present

## 2024-07-21 NOTE — Progress Notes (Unsigned)
 Cardiology Office Note:   Date:  07/22/2024  ID:  Mitchell Chandler, DOB 02-Sep-1932, MRN [id number] PCP: Joesph Annabella HERO, FNP  [facility] Providers Cardiologist:  None Electrophysiologist:  Fonda Kitty, MD {  History of Present Illness:   Mitchell Chandler is a 89 y.o. male who I saw for evaluation of a murmur.  He had a history of bypass in 1995 by Dr. Tanda.  He said 6 vessels.  I last saw him in 2024.  He was found to have moderate AS.   He had fatigue and symptomatic bradycardia with runs of third-degree heart block and is now status post pacemaker.  He did feel better for a while but has had a little more leg fatigue in the last few days.  He is getting around today without a walker.  His daughter accompanies him.  He is under more stress because his wife is in hospice.  He is not having any presyncope or syncope.  He does have a cough.  He was taken off of diuretic in July because his creatinine was a little elevated and his blood pressure low but his blood pressure has been trending up and his daughter thinks his cough is a little more wet.  He is not describing PND or orthopnea.  He had no new chest pressure, neck or arm discomfort.  ROS: As stated in the HPI and negative for all other systems.  Studies Reviewed:    EKG:     NA  Risk Assessment/Calculations:         Physical Exam:   VS:  BP (!) 172/75   Pulse 63   Ht 5' 8 (1.727 m)   Wt 182 lb (82.6 kg)   SpO2 96%   BMI 27.67 kg/m    Wt Readings from Last 3 Encounters:  07/22/24 182 lb (82.6 kg)  06/16/24 178 lb 6.4 oz (80.9 kg)  06/08/24 182 lb (82.6 kg)     GEN: Well nourished, well developed in no acute distress NECK: No JVD; No carotid bruits CARDIAC: RRR, 2 out of 6 apical systolic murmur radiating slightly at the aortic outflow tract, no diastolic murmurs, rubs, gallops RESPIRATORY:  Clear to auscultation without rales, wheezing or rhonchi  CHEST:  Pacemaker pocket in place.   ABDOMEN: Soft, non-tender,  non-distended EXTREMITIES:  No edema; No deformity   ASSESSMENT AND PLAN:   AS:   This was mild to mod on echo in May 2025.  I will follow this clinically.  No change in therapy.  Dyslipidemia: LDL was 64.  No change in therapy.    HTN: The blood pressure is elevated but this is very unusual and I went through an entire blood pressure diary.  It is typically very well-controlled.  It is labile.  I will change therapy as below.  CAD:   He has had CABG. he is having no anginal symptoms.  No change in therapy.   CHB/bradycardia:    He is status post pacemaker.  He had normal function in the interrogation in July 2025.  I reviewed this.    Cough: Given the cough and his mildly elevated blood pressure creeping up on his home diary since he was off diuretic I will start Lasix .  He did have some renal insufficiency on 20 mg so his daughter is going to cut those in half and just take 10 mg a day.     Follow up with me in six months in [place].  Signed, Lynwood Schilling, MD

## 2024-07-22 ENCOUNTER — Encounter: Payer: Self-pay | Admitting: Cardiology

## 2024-07-22 ENCOUNTER — Ambulatory Visit: Attending: Cardiology | Admitting: Cardiology

## 2024-07-22 VITALS — BP 172/75 | HR 63 | Ht 68.0 in | Wt 182.0 lb

## 2024-07-22 DIAGNOSIS — E785 Hyperlipidemia, unspecified: Secondary | ICD-10-CM | POA: Diagnosis present

## 2024-07-22 DIAGNOSIS — I350 Nonrheumatic aortic (valve) stenosis: Secondary | ICD-10-CM | POA: Insufficient documentation

## 2024-07-22 DIAGNOSIS — I25810 Atherosclerosis of coronary artery bypass graft(s) without angina pectoris: Secondary | ICD-10-CM | POA: Insufficient documentation

## 2024-07-22 DIAGNOSIS — I10 Essential (primary) hypertension: Secondary | ICD-10-CM | POA: Insufficient documentation

## 2024-07-22 LAB — CUP PACEART REMOTE DEVICE CHECK
Battery Remaining Longevity: 121 mo
Battery Remaining Percentage: 95.5 %
Battery Voltage: 3.04 V
Brady Statistic AP VP Percent: 79 %
Brady Statistic AP VS Percent: 1 %
Brady Statistic AS VP Percent: 21 %
Brady Statistic AS VS Percent: 1 %
Brady Statistic RA Percent Paced: 79 %
Brady Statistic RV Percent Paced: 99 %
Date Time Interrogation Session: 20250820040023
Implantable Lead Connection Status: 753985
Implantable Lead Connection Status: 753985
Implantable Lead Implant Date: 20250708
Implantable Lead Implant Date: 20250708
Implantable Lead Location: 753859
Implantable Lead Location: 753860
Implantable Pulse Generator Implant Date: 20250708
Lead Channel Impedance Value: 390 Ohm
Lead Channel Impedance Value: 490 Ohm
Lead Channel Pacing Threshold Amplitude: 0.625 V
Lead Channel Pacing Threshold Amplitude: 0.625 V
Lead Channel Pacing Threshold Pulse Width: 0.5 ms
Lead Channel Pacing Threshold Pulse Width: 0.5 ms
Lead Channel Sensing Intrinsic Amplitude: 12 mV
Lead Channel Sensing Intrinsic Amplitude: 2 mV
Lead Channel Setting Pacing Amplitude: 0.875
Lead Channel Setting Pacing Amplitude: 1.625
Lead Channel Setting Pacing Pulse Width: 0.5 ms
Lead Channel Setting Sensing Sensitivity: 2 mV
Pulse Gen Model: 2272
Pulse Gen Serial Number: 8284684

## 2024-07-22 MED ORDER — FUROSEMIDE 20 MG PO TABS: 10.0000 mg | ORAL_TABLET | Freq: Every day | ORAL | 3 refills | Status: AC

## 2024-07-22 NOTE — Patient Instructions (Addendum)
 Medication Instructions:  Lasix  10 mg once daily (cut 20 mg pills in half) *If you need a refill on your cardiac medications before your next appointment, please call your pharmacy*  Lab Work: NONE If you have labs (blood work) drawn today and your tests are completely normal, you will receive your results only by: MyChart Message (if you have MyChart) OR A paper copy in the mail If you have any lab test that is abnormal or we need to change your treatment, we will call you to review the results.  Testing/Procedures: NONE  Follow-Up: At [facility], you and your health needs are our priority.  As part of our continuing mission to provide you with exceptional heart care, our providers are all part of one team.  This team includes your primary Cardiologist (physician) and Advanced Practice Providers or APPs (Physician Assistants and Nurse Practitioners) who all work together to provide you with the care you need, when you need it.  Your next appointment:   6 month(s) in [place]  Provider:   Lavona, MD  We recommend signing up for the patient portal called MyChart.  Sign up information is provided on this After Visit Summary.  MyChart is used to connect with patients for Virtual Visits (Telemedicine).  Patients are able to view lab/test results, encounter notes, upcoming appointments, etc.  Non-urgent messages can be sent to your provider as well.   To learn more about what you can do with MyChart, go to [email or web address].

## 2024-08-08 ENCOUNTER — Ambulatory Visit: Payer: Self-pay | Admitting: Cardiology

## 2024-08-09 ENCOUNTER — Other Ambulatory Visit: Payer: Self-pay | Admitting: Family Medicine

## 2024-08-09 DIAGNOSIS — N401 Enlarged prostate with lower urinary tract symptoms: Secondary | ICD-10-CM

## 2024-08-16 ENCOUNTER — Other Ambulatory Visit: Payer: Self-pay | Admitting: Family Medicine

## 2024-08-25 NOTE — Progress Notes (Signed)
 Remote PPM Transmission

## 2024-09-15 ENCOUNTER — Other Ambulatory Visit: Payer: Self-pay | Admitting: Family Medicine

## 2024-09-15 DIAGNOSIS — J309 Allergic rhinitis, unspecified: Secondary | ICD-10-CM

## 2024-09-19 NOTE — Progress Notes (Unsigned)
 Electrophysiology Office Note:   Date:  09/21/2024  ID:  Mitchell Chandler, DOB May 03, 1932, MRN [id number]  Primary Cardiologist: None Primary Heart Failure: None Electrophysiologist: Fonda Kitty, MD       History of Present Illness:   LONG BRIMAGE is a 89 y.o. male with h/o symptomatic bradycardia due to intermittent CHB s/p PPM, CAD s/p CABG, moderate AS, HTN, HLD seen today for routine electrophysiology follow-up s/p Pacemaker implant.  Since last being seen in our clinic the patient reports doing okay.  His daughter accompanies him to the visit and she reports that he initially felt fairly well for the first 2 weeks after the pacemaker implant with good energy levels.  However since that time she notes that he is reported for fatigue and nonfocal weakness.  She brings in meticulous records of his home blood pressures and they show good control running in the 110's to 140s systolic.  He will occasionally have an elevated BP reading but nothing consistent based on home trends.  He  denies chest pain, palpitations, dyspnea, PND, orthopnea, nausea, vomiting, dizziness, syncope, edema, weight gain, or early satiety.    Review of systems complete and found to be negative unless listed in HPI.    EP Information / Studies Reviewed:    EKG is ordered today. Personal review as below.  EKG Interpretation Date/Time:  Tuesday September 21 2024 13:30:11 EDT Ventricular Rate:  60 PR Interval:  208 QRS Duration:  140 QT Interval:  444 QTC Calculation: 444 R Axis:   -59  Text Interpretation: AV dual-paced rhythm Confirmed by Aniceto Jarvis ([id number]) on 09/21/2024 1:40:52 PM   PPM Interrogation-  reviewed in detail today,  See PACEART report.  Device History: Abbott Dual Chamber PPM implanted 06/08/24 for CHB  Risk Assessment/Calculations:     HYPERTENSION CONTROL Vitals:   09/21/24 1320 09/21/24 1330  BP: (!) 175/70 (!) 158/60    The patient's blood pressure is elevated above target  today.  In order to address the patient's elevated BP: Blood pressure will be monitored at home to determine if medication changes need to be made.           Physical Exam:   VS:  BP (!) 158/60   Pulse 61   Ht 5' 8 (1.727 m)   Wt 185 lb 12.8 oz (84.3 kg)   SpO2 96%   BMI 28.25 kg/m    Wt Readings from Last 3 Encounters:  09/21/24 185 lb 12.8 oz (84.3 kg)  07/22/24 182 lb (82.6 kg)  06/16/24 178 lb 6.4 oz (80.9 kg)     GEN: Well nourished, well developed in no acute distress NECK: No JVD; No carotid bruits CARDIAC: Regular rate and rhythm, 2-3/6 SEM, no rubs, gallops.  PPM site well-healed without tethering RESPIRATORY:  Clear to auscultation without rales, wheezing or rhonchi  ABDOMEN: Soft, non-tender, non-distended EXTREMITIES:  No edema; No deformity   ASSESSMENT AND PLAN:    Symptomatic Bradycardia, CHB s/p Abbott PPM  -Normal PPM function -See Pace Art report -No changes today -device site well healed  Moderate AS  -given persistent fatigue symptoms, will update ECHO in light of known AAS  CAD s/p remote CABG  -no anginal symptoms  Hypertension  -home BP readings show great control  - Currently elevated in clinic.  Discussed given overall BP control will hold making adjustments on regimen.  Patient to follow-up with PCP regarding BP remains consistently elevated at home.  Reviewed with patient and family that particularly  tight control of his blood pressure may make his fatigue worse as well as other symptoms such as lightheadedness and dizziness.  Reasonable goal for him 130s systolic    Disposition:   Follow up with Dr. Kennyth in 12 months  Signed, Daphne Barrack, NP-C, AGACNP-BC [facility] - Electrophysiology  09/21/2024, 6:09 PM

## 2024-09-21 ENCOUNTER — Ambulatory Visit: Payer: PRIVATE HEALTH INSURANCE | Attending: Cardiology | Admitting: Pulmonary Disease

## 2024-09-21 ENCOUNTER — Encounter: Payer: Self-pay | Admitting: Pulmonary Disease

## 2024-09-21 VITALS — BP 158/60 | HR 61 | Ht 68.0 in | Wt 185.8 lb

## 2024-09-21 DIAGNOSIS — Z950 Presence of cardiac pacemaker: Secondary | ICD-10-CM | POA: Insufficient documentation

## 2024-09-21 DIAGNOSIS — R001 Bradycardia, unspecified: Secondary | ICD-10-CM | POA: Diagnosis not present

## 2024-09-21 DIAGNOSIS — R011 Cardiac murmur, unspecified: Secondary | ICD-10-CM | POA: Insufficient documentation

## 2024-09-21 DIAGNOSIS — I350 Nonrheumatic aortic (valve) stenosis: Secondary | ICD-10-CM | POA: Insufficient documentation

## 2024-09-21 DIAGNOSIS — I25810 Atherosclerosis of coronary artery bypass graft(s) without angina pectoris: Secondary | ICD-10-CM | POA: Insufficient documentation

## 2024-09-21 DIAGNOSIS — Z951 Presence of aortocoronary bypass graft: Secondary | ICD-10-CM | POA: Insufficient documentation

## 2024-09-21 DIAGNOSIS — I442 Atrioventricular block, complete: Secondary | ICD-10-CM | POA: Diagnosis not present

## 2024-09-21 LAB — CUP PACEART INCLINIC DEVICE CHECK
Battery Remaining Longevity: 116 mo
Battery Voltage: 3.02 V
Brady Statistic RA Percent Paced: 80 %
Brady Statistic RV Percent Paced: 99.96 %
Date Time Interrogation Session: 20251021180532
Implantable Lead Connection Status: 753985
Implantable Lead Connection Status: 753985
Implantable Lead Implant Date: 20250708
Implantable Lead Implant Date: 20250708
Implantable Lead Location: 753859
Implantable Lead Location: 753860
Implantable Pulse Generator Implant Date: 20250708
Lead Channel Impedance Value: 425 Ohm
Lead Channel Impedance Value: 475 Ohm
Lead Channel Pacing Threshold Amplitude: 0.5 V
Lead Channel Pacing Threshold Amplitude: 0.75 V
Lead Channel Pacing Threshold Pulse Width: 0.5 ms
Lead Channel Pacing Threshold Pulse Width: 0.5 ms
Lead Channel Sensing Intrinsic Amplitude: 12 mV
Lead Channel Sensing Intrinsic Amplitude: 3.1 mV
Lead Channel Setting Pacing Amplitude: 0.75 V
Lead Channel Setting Pacing Amplitude: 1.75 V
Lead Channel Setting Pacing Pulse Width: 0.5 ms
Lead Channel Setting Sensing Sensitivity: 2 mV
Pulse Gen Model: 2272
Pulse Gen Serial Number: 8284684

## 2024-09-21 NOTE — Patient Instructions (Signed)
 Medication Instructions:  Your physician recommends that you continue on your current medications as directed. Please refer to the Current Medication list given to you today.  *If you need a refill on your cardiac medications before your next appointment, please call your pharmacy*  Lab Work: None ordered If you have labs (blood work) drawn today and your tests are completely normal, you will receive your results only by: MyChart Message (if you have MyChart) OR A paper copy in the mail If you have any lab test that is abnormal or we need to change your treatment, we will call you to review the results.  Testing/Procedures: Your physician has requested that you have an echocardiogram. Echocardiography is a painless test that uses sound waves to create images of your heart. It provides your doctor with information about the size and shape of your heart and how well your heart's chambers and valves are working. This procedure takes approximately one hour. There are no restrictions for this procedure. Please do NOT wear cologne, perfume, aftershave, or lotions (deodorant is allowed). Please arrive 15 minutes prior to your appointment time.  Please note: We ask at that you not bring children with you during ultrasound (echo/ vascular) testing. Due to room size and safety concerns, children are not allowed in the ultrasound rooms during exams. Our front office staff cannot provide observation of children in our lobby area while testing is being conducted. An adult accompanying a patient to their appointment will only be allowed in the ultrasound room at the discretion of the ultrasound technician under special circumstances. We apologize for any inconvenience.   Follow-Up: At [facility], you and your health needs are our priority.  As part of our continuing mission to provide you with exceptional heart care, our providers are all part of one team.  This team includes your primary  Cardiologist (physician) and Advanced Practice Providers or APPs (Physician Assistants and Nurse Practitioners) who all work together to provide you with the care you need, when you need it.  Your next appointment:   1 year(s)  Provider:   You may see Fonda Kitty, MD or one of the following Advanced Practice Providers on your designated Care Team:   Charlies Arthur, PA-C Michael Andy Tillery, PA-C Suzann Riddle, NP Daphne Barrack, NP Artist Pouch, PA-C

## 2024-09-25 ENCOUNTER — Ambulatory Visit: Payer: Self-pay | Admitting: Cardiology

## 2024-09-29 ENCOUNTER — Ambulatory Visit: Admitting: Family Medicine

## 2024-09-29 VITALS — BP 130/63 | HR 63 | Temp 97.1°F | Ht 68.0 in | Wt 188.4 lb

## 2024-09-29 DIAGNOSIS — Z23 Encounter for immunization: Secondary | ICD-10-CM

## 2024-09-29 DIAGNOSIS — I10 Essential (primary) hypertension: Secondary | ICD-10-CM

## 2024-09-29 DIAGNOSIS — I350 Nonrheumatic aortic (valve) stenosis: Secondary | ICD-10-CM | POA: Diagnosis not present

## 2024-09-29 DIAGNOSIS — J069 Acute upper respiratory infection, unspecified: Secondary | ICD-10-CM

## 2024-09-29 DIAGNOSIS — Z95811 Presence of heart assist device: Secondary | ICD-10-CM

## 2024-09-29 DIAGNOSIS — I442 Atrioventricular block, complete: Secondary | ICD-10-CM | POA: Insufficient documentation

## 2024-09-29 DIAGNOSIS — N1831 Chronic kidney disease, stage 3a: Secondary | ICD-10-CM

## 2024-09-29 DIAGNOSIS — Z79899 Other long term (current) drug therapy: Secondary | ICD-10-CM

## 2024-09-29 DIAGNOSIS — G629 Polyneuropathy, unspecified: Secondary | ICD-10-CM

## 2024-09-29 MED ORDER — PREGABALIN 25 MG PO CAPS: 25.0000 mg | ORAL_CAPSULE | Freq: Three times a day (TID) | ORAL | 2 refills | Status: AC

## 2024-09-29 NOTE — Progress Notes (Unsigned)
   Established Patient Office Visit  Subjective   Patient ID: MYLZ YUAN, male    DOB: [birth year]  Age: 89 y.o. MRN: [id number]  Chief Complaint  Patient presents with   Medical Management of Chronic Issues    HPI  {History (Optional):23778}  ROS    Objective:     BP 130/63   Pulse 63   Temp (!) 97.1 F (36.2 C) (Temporal)   Ht 5' 8 (1.727 m)   Wt 188 lb 6.4 oz (85.5 kg)   SpO2 100%   BMI 28.65 kg/m  BP Readings from Last 3 Encounters:  09/29/24 130/63  09/21/24 (!) 158/60  07/22/24 (!) 172/75   Wt Readings from Last 3 Encounters:  09/29/24 188 lb 6.4 oz (85.5 kg)  09/21/24 185 lb 12.8 oz (84.3 kg)  07/22/24 182 lb (82.6 kg)     Physical Exam   No results found for any visits on 09/29/24.  {Labs (Optional):23779}  The ASCVD Risk score (Arnett DK, et al., 2019) failed to calculate for the following reasons:   The 2019 ASCVD risk score is only valid for ages 61 to 45    Assessment & Plan:   Primary hypertension  Stage 3a chronic kidney disease (HCC)  Neuropathy  Controlled substance agreement signed  Encounter for immunization -     Flu vaccine HIGH DOSE PF(Fluzone Trivalent)     No follow-ups on file.    Annabella CHRISTELLA Search, FNP

## 2024-09-30 ENCOUNTER — Encounter: Payer: Self-pay | Admitting: Family Medicine

## 2024-10-09 ENCOUNTER — Other Ambulatory Visit: Payer: Self-pay | Admitting: *Deleted

## 2024-10-09 DIAGNOSIS — E782 Mixed hyperlipidemia: Secondary | ICD-10-CM

## 2024-10-15 ENCOUNTER — Ambulatory Visit (HOSPITAL_COMMUNITY)
Admission: RE | Admit: 2024-10-15 | Discharge: 2024-10-15 | Disposition: A | Discharge status: Home / Self Care | Payer: PRIVATE HEALTH INSURANCE | Source: Ambulatory Visit | Attending: Pulmonary Disease | Admitting: Pulmonary Disease

## 2024-10-15 DIAGNOSIS — R011 Cardiac murmur, unspecified: Secondary | ICD-10-CM | POA: Diagnosis not present

## 2024-10-15 DIAGNOSIS — Z950 Presence of cardiac pacemaker: Secondary | ICD-10-CM | POA: Diagnosis not present

## 2024-10-15 DIAGNOSIS — I350 Nonrheumatic aortic (valve) stenosis: Secondary | ICD-10-CM | POA: Diagnosis not present

## 2024-10-15 DIAGNOSIS — I442 Atrioventricular block, complete: Secondary | ICD-10-CM

## 2024-10-15 LAB — ECHOCARDIOGRAM COMPLETE
AR max vel: 0.88 cm2
AV Area VTI: 0.9 cm2
AV Area mean vel: 0.9 cm2
AV Mean grad: 21 mmHg
AV Peak grad: 40.4 mmHg
Ao pk vel: 3.18 m/s
S' Lateral: 2.06 cm

## 2024-10-20 ENCOUNTER — Ambulatory Visit (INDEPENDENT_AMBULATORY_CARE_PROVIDER_SITE_OTHER)

## 2024-10-20 DIAGNOSIS — I442 Atrioventricular block, complete: Secondary | ICD-10-CM

## 2024-10-21 LAB — CUP PACEART REMOTE DEVICE CHECK
Battery Remaining Longevity: 114 mo
Battery Remaining Percentage: 95.5 %
Battery Voltage: 3.02 V
Brady Statistic AP VP Percent: 79 %
Brady Statistic AP VS Percent: 1 %
Brady Statistic AS VP Percent: 21 %
Brady Statistic AS VS Percent: 1 %
Brady Statistic RA Percent Paced: 79 %
Brady Statistic RV Percent Paced: 99 %
Date Time Interrogation Session: 20251119050001
Implantable Lead Connection Status: 753985
Implantable Lead Connection Status: 753985
Implantable Lead Implant Date: 20250708
Implantable Lead Implant Date: 20250708
Implantable Lead Location: 753859
Implantable Lead Location: 753860
Implantable Pulse Generator Implant Date: 20250708
Lead Channel Impedance Value: 390 Ohm
Lead Channel Impedance Value: 460 Ohm
Lead Channel Pacing Threshold Amplitude: 0.625 V
Lead Channel Pacing Threshold Amplitude: 0.75 V
Lead Channel Pacing Threshold Pulse Width: 0.5 ms
Lead Channel Pacing Threshold Pulse Width: 0.5 ms
Lead Channel Sensing Intrinsic Amplitude: 1.7 mV
Lead Channel Sensing Intrinsic Amplitude: 12 mV
Lead Channel Setting Pacing Amplitude: 0.875
Lead Channel Setting Pacing Amplitude: 1.75 V
Lead Channel Setting Pacing Pulse Width: 0.5 ms
Lead Channel Setting Sensing Sensitivity: 2 mV
Pulse Gen Model: 2272
Pulse Gen Serial Number: 8284684

## 2024-10-22 NOTE — Progress Notes (Signed)
 Remote PPM Transmission

## 2024-10-24 ENCOUNTER — Ambulatory Visit: Payer: Self-pay | Admitting: Cardiology

## 2024-11-08 ENCOUNTER — Other Ambulatory Visit: Payer: Self-pay | Admitting: Family Medicine

## 2024-11-08 DIAGNOSIS — N401 Enlarged prostate with lower urinary tract symptoms: Secondary | ICD-10-CM

## 2024-11-23 NOTE — Telephone Encounter (Signed)
 Spoke with pt's daughter per [facility] regarding an appointment with Hochrein per Daphne Barrack, NP. Pt scheduled for 2/5. Daughter verbalized understanding. All questions if any were answered.

## 2024-11-23 NOTE — Telephone Encounter (Signed)
-----   Message from Daphne Barrack, NP sent at 11/19/2024  9:22 AM EST ----- Aleck,   I sent this request into the scheduling pool 11/21 and I haven't seen they have scheduled him.  Can you take a look or keep an eye out for it too? Thank you!  Daphne

## 2024-12-31 ENCOUNTER — Ambulatory Visit: Payer: PRIVATE HEALTH INSURANCE | Admitting: Family Medicine

## 2025-01-05 NOTE — Progress Notes (Unsigned)
" °  Cardiology Office Note:   Date:  01/06/2025  ID:  Mitchell Chandler, DOB 1931/12/08, MRN [id number] PCP: Joesph Annabella HERO, FNP  [facility] Providers Cardiologist:  None Electrophysiologist:  Fonda Kitty, MD {  History of Present Illness:   Mitchell Chandler is a 89 y.o. male who I saw for evaluation of a murmur.  He had a history of bypass in 1995 by Dr. Tanda.  He said 6 vessels.  He has moderately severe AS on echo in Nov 2025.    He comes in today and he has had no new cardiovascular symptoms.  He gets around slowly in his daughter's home.  He uses a walker.  He does 12 laps around the house.  He is not limited by his breathing with that.  He is not describing PND or orthopnea.  He has not had any weight gain or edema.  He has a cough where he has to clear his throat but this is not productive and has not had fevers or chills.  His sister died yesterday.    ROS: As stated in the HPI and negative for all other systems.  Studies Reviewed:    EKG:     NA  Risk Assessment/Calculations:     Physical Exam:   VS:  BP (!) 158/74 (BP Location: Left Arm, Patient Position: Sitting, Cuff Size: Large)   Pulse 67   Resp 16   Ht 5' 8 (1.727 m)   Wt 196 lb (88.9 kg)   SpO2 96%   BMI 29.80 kg/m    Wt Readings from Last 3 Encounters:  01/06/25 196 lb (88.9 kg)  09/29/24 188 lb 6.4 oz (85.5 kg)  09/21/24 185 lb 12.8 oz (84.3 kg)     GEN: Well nourished, well developed in no acute distress NECK: No JVD; No carotid bruits CARDIAC: RRR, 3 out of 6 apical systolic murmur radiating slightly at the aortic outflow tract and mid peaking, no diastolic murmurs murmurs, rubs, gallops RESPIRATORY:  Clear to auscultation without rales, wheezing or rhonchi  ABDOMEN: Soft, non-tender, non-distended EXTREMITIES:  No edema; No deformity    ASSESSMENT AND PLAN:   AS:   This was moderately severe on echo in Nov 2025.  He has no overt symptoms related to this.  I talked to him and his daughter  about this and we would like more conservative therapies at this point and I think this is reasonable.  Med changes as below.   Dyslipidemia: LDL was 64.  HDL is 53.  No change in therapy.    HTN: The blood pressure is not at target.  I am going to add amlodipine  2.5 mg daily.   CAD: He has had no symptoms of angina.  He will continue with risk reduction.  CHB/bradycardia:    He is status post pacemaker.  He had normal function in the interrogation in Nov 2025.  I reviewed the most recent interrogation.   Cough: This has been chronic nonproductive.  No change in therapy.  I think he is euvolemic and this does not likely represent pulmonary edema.      Follow up with me in 6 months.   Signed, Lynwood Schilling, MD   "

## 2025-01-06 ENCOUNTER — Encounter: Payer: Self-pay | Admitting: Cardiology

## 2025-01-06 ENCOUNTER — Ambulatory Visit: Admitting: Cardiology

## 2025-01-06 ENCOUNTER — Other Ambulatory Visit: Payer: Self-pay | Admitting: Family Medicine

## 2025-01-06 VITALS — BP 158/74 | HR 67 | Resp 16 | Ht 68.0 in | Wt 196.0 lb

## 2025-01-06 DIAGNOSIS — I25810 Atherosclerosis of coronary artery bypass graft(s) without angina pectoris: Secondary | ICD-10-CM

## 2025-01-06 DIAGNOSIS — R001 Bradycardia, unspecified: Secondary | ICD-10-CM

## 2025-01-06 DIAGNOSIS — I350 Nonrheumatic aortic (valve) stenosis: Secondary | ICD-10-CM | POA: Diagnosis not present

## 2025-01-06 DIAGNOSIS — E782 Mixed hyperlipidemia: Secondary | ICD-10-CM

## 2025-01-06 DIAGNOSIS — I10 Essential (primary) hypertension: Secondary | ICD-10-CM | POA: Diagnosis not present

## 2025-01-06 DIAGNOSIS — E785 Hyperlipidemia, unspecified: Secondary | ICD-10-CM

## 2025-01-06 MED ORDER — AMLODIPINE BESYLATE 2.5 MG PO TABS: 2.5000 mg | ORAL_TABLET | Freq: Every day | ORAL | 3 refills | Status: AC

## 2025-01-06 NOTE — Patient Instructions (Signed)
 Medication Instructions:  Start Amlodipine  2.5 mg once daily *If you need a refill on your cardiac medications before your next appointment, please call your pharmacy*  Lab Work: NONE If you have labs (blood work) drawn today and your tests are completely normal, you will receive your results only by: MyChart Message (if you have MyChart) OR A paper copy in the mail If you have any lab test that is abnormal or we need to change your treatment, we will call you to review the results.  Testing/Procedures: NONE  Follow-Up: At [facility], you and your health needs are our priority.  As part of our continuing mission to provide you with exceptional heart care, our providers are all part of one team.  This team includes your primary Cardiologist (physician) and Advanced Practice Providers or APPs (Physician Assistants and Nurse Practitioners) who all work together to provide you with the care you need, when you need it.  Your next appointment:   6 month(s)  Provider:   Lavona, MD  We recommend signing up for the patient portal called MyChart.  Sign up information is provided on this After Visit Summary.  MyChart is used to connect with patients for Virtual Visits (Telemedicine).  Patients are able to view lab/test results, encounter notes, upcoming appointments, etc.  Non-urgent messages can be sent to your provider as well.   To learn more about what you can do with MyChart, go to [email or web address].   Other Instructions

## 2025-01-14 ENCOUNTER — Ambulatory Visit: Payer: PRIVATE HEALTH INSURANCE | Admitting: Family Medicine

## 2025-01-19 ENCOUNTER — Encounter

## 2025-04-20 ENCOUNTER — Encounter

## 2025-07-20 ENCOUNTER — Encounter

## 2025-10-19 ENCOUNTER — Encounter

## 2026-01-18 ENCOUNTER — Encounter

## 2026-04-19 ENCOUNTER — Encounter
